# Patient Record
Sex: Male | Born: 1950 | Race: Black or African American | Hispanic: No | Smoking: Never smoker
Health system: Southern US, Community
[De-identification: ages and names within clinical notes are randomized; demographics above are authoritative.]

## PROBLEM LIST (undated history)

## (undated) DIAGNOSIS — Z973 Presence of spectacles and contact lenses: Secondary | ICD-10-CM

## (undated) DIAGNOSIS — F32A Depression, unspecified: Secondary | ICD-10-CM

## (undated) DIAGNOSIS — K219 Gastro-esophageal reflux disease without esophagitis: Secondary | ICD-10-CM

## (undated) DIAGNOSIS — R531 Weakness: Secondary | ICD-10-CM

## (undated) DIAGNOSIS — I10 Essential (primary) hypertension: Secondary | ICD-10-CM

## (undated) DIAGNOSIS — Z9889 Other specified postprocedural states: Secondary | ICD-10-CM

## (undated) DIAGNOSIS — K224 Dyskinesia of esophagus: Secondary | ICD-10-CM

## (undated) DIAGNOSIS — E119 Type 2 diabetes mellitus without complications: Secondary | ICD-10-CM

## (undated) DIAGNOSIS — K5790 Diverticulosis of intestine, part unspecified, without perforation or abscess without bleeding: Secondary | ICD-10-CM

## (undated) DIAGNOSIS — E785 Hyperlipidemia, unspecified: Secondary | ICD-10-CM

## (undated) DIAGNOSIS — F329 Major depressive disorder, single episode, unspecified: Secondary | ICD-10-CM

## (undated) DIAGNOSIS — K760 Fatty (change of) liver, not elsewhere classified: Secondary | ICD-10-CM

## (undated) DIAGNOSIS — J302 Other seasonal allergic rhinitis: Secondary | ICD-10-CM

## (undated) DIAGNOSIS — C61 Malignant neoplasm of prostate: Secondary | ICD-10-CM

## (undated) HISTORY — PX: PROSTATECTOMY: SHX69

## (undated) HISTORY — PX: COLONOSCOPY: SHX174

## (undated) HISTORY — DX: Weakness: R53.1

---

## 1997-06-22 ENCOUNTER — Ambulatory Visit (HOSPITAL_COMMUNITY): Admission: RE | Admit: 1997-06-22 | Discharge: 1997-06-22 | Payer: Self-pay | Admitting: Gastroenterology

## 1999-12-06 ENCOUNTER — Emergency Department (HOSPITAL_COMMUNITY): Admission: EM | Admit: 1999-12-06 | Discharge: 1999-12-06 | Payer: Self-pay | Admitting: Emergency Medicine

## 2000-02-02 ENCOUNTER — Emergency Department (HOSPITAL_COMMUNITY): Admission: EM | Admit: 2000-02-02 | Discharge: 2000-02-02 | Payer: Self-pay | Admitting: *Deleted

## 2000-05-20 ENCOUNTER — Encounter: Admission: RE | Admit: 2000-05-20 | Discharge: 2000-08-18 | Payer: Self-pay | Admitting: Family Medicine

## 2000-09-24 ENCOUNTER — Emergency Department (HOSPITAL_COMMUNITY): Admission: EM | Admit: 2000-09-24 | Discharge: 2000-09-25 | Payer: Self-pay | Admitting: Emergency Medicine

## 2000-09-25 ENCOUNTER — Encounter: Payer: Self-pay | Admitting: Emergency Medicine

## 2001-02-20 ENCOUNTER — Ambulatory Visit (HOSPITAL_COMMUNITY): Admission: RE | Admit: 2001-02-20 | Discharge: 2001-02-20 | Payer: Self-pay | Admitting: Gastroenterology

## 2001-08-22 ENCOUNTER — Encounter: Payer: Self-pay | Admitting: Family Medicine

## 2001-08-22 ENCOUNTER — Ambulatory Visit (HOSPITAL_COMMUNITY): Admission: RE | Admit: 2001-08-22 | Discharge: 2001-08-22 | Payer: Self-pay | Admitting: Family Medicine

## 2001-09-08 ENCOUNTER — Encounter: Payer: Self-pay | Admitting: Family Medicine

## 2001-09-08 ENCOUNTER — Encounter: Admission: RE | Admit: 2001-09-08 | Discharge: 2001-09-08 | Payer: Self-pay | Admitting: Family Medicine

## 2001-11-06 ENCOUNTER — Emergency Department (HOSPITAL_COMMUNITY): Admission: EM | Admit: 2001-11-06 | Discharge: 2001-11-06 | Payer: Self-pay | Admitting: Emergency Medicine

## 2002-03-14 ENCOUNTER — Ambulatory Visit (HOSPITAL_COMMUNITY): Admission: RE | Admit: 2002-03-14 | Discharge: 2002-03-14 | Payer: Self-pay | Admitting: *Deleted

## 2002-03-14 ENCOUNTER — Encounter: Payer: Self-pay | Admitting: *Deleted

## 2002-05-06 ENCOUNTER — Emergency Department (HOSPITAL_COMMUNITY): Admission: EM | Admit: 2002-05-06 | Discharge: 2002-05-06 | Payer: Self-pay

## 2003-01-23 HISTORY — PX: CARDIAC CATHETERIZATION: SHX172

## 2003-02-10 ENCOUNTER — Encounter: Admission: RE | Admit: 2003-02-10 | Discharge: 2003-02-10 | Payer: Self-pay | Admitting: Family Medicine

## 2003-05-26 ENCOUNTER — Emergency Department (HOSPITAL_COMMUNITY): Admission: EM | Admit: 2003-05-26 | Discharge: 2003-05-26 | Payer: Self-pay | Admitting: Emergency Medicine

## 2003-07-23 ENCOUNTER — Inpatient Hospital Stay (HOSPITAL_COMMUNITY): Admission: EM | Admit: 2003-07-23 | Discharge: 2003-07-27 | Payer: Self-pay | Admitting: Emergency Medicine

## 2003-12-22 ENCOUNTER — Encounter: Admission: RE | Admit: 2003-12-22 | Discharge: 2003-12-22 | Payer: Self-pay | Admitting: Gastroenterology

## 2004-01-23 DIAGNOSIS — C61 Malignant neoplasm of prostate: Secondary | ICD-10-CM

## 2004-01-23 HISTORY — DX: Malignant neoplasm of prostate: C61

## 2004-02-02 ENCOUNTER — Encounter: Admission: RE | Admit: 2004-02-02 | Discharge: 2004-02-02 | Payer: Self-pay | Admitting: Gastroenterology

## 2004-06-29 ENCOUNTER — Ambulatory Visit (HOSPITAL_COMMUNITY): Admission: RE | Admit: 2004-06-29 | Discharge: 2004-06-29 | Payer: Self-pay | Admitting: Urology

## 2004-08-22 ENCOUNTER — Inpatient Hospital Stay (HOSPITAL_COMMUNITY): Admission: RE | Admit: 2004-08-22 | Discharge: 2004-08-25 | Payer: Self-pay | Admitting: Urology

## 2004-08-22 ENCOUNTER — Encounter (INDEPENDENT_AMBULATORY_CARE_PROVIDER_SITE_OTHER): Payer: Self-pay | Admitting: Specialist

## 2004-08-22 DIAGNOSIS — Z8546 Personal history of malignant neoplasm of prostate: Secondary | ICD-10-CM

## 2004-08-22 HISTORY — DX: Personal history of malignant neoplasm of prostate: Z85.46

## 2005-09-11 ENCOUNTER — Encounter: Admission: RE | Admit: 2005-09-11 | Discharge: 2005-09-11 | Payer: Self-pay | Admitting: Family Medicine

## 2006-10-05 ENCOUNTER — Emergency Department (HOSPITAL_COMMUNITY): Admission: EM | Admit: 2006-10-05 | Discharge: 2006-10-05 | Payer: Self-pay | Admitting: Emergency Medicine

## 2007-04-16 ENCOUNTER — Emergency Department (HOSPITAL_COMMUNITY): Admission: EM | Admit: 2007-04-16 | Discharge: 2007-04-16 | Payer: Self-pay | Admitting: Emergency Medicine

## 2008-05-03 ENCOUNTER — Emergency Department (HOSPITAL_COMMUNITY): Admission: EM | Admit: 2008-05-03 | Discharge: 2008-05-03 | Payer: Self-pay | Admitting: *Deleted

## 2008-12-29 ENCOUNTER — Emergency Department (HOSPITAL_COMMUNITY): Admission: EM | Admit: 2008-12-29 | Discharge: 2008-12-29 | Payer: Self-pay | Admitting: Emergency Medicine

## 2009-03-08 ENCOUNTER — Emergency Department (HOSPITAL_COMMUNITY): Admission: EM | Admit: 2009-03-08 | Discharge: 2009-03-08 | Payer: Self-pay | Admitting: Family Medicine

## 2010-04-14 ENCOUNTER — Other Ambulatory Visit: Payer: Self-pay | Admitting: Family Medicine

## 2010-04-18 ENCOUNTER — Other Ambulatory Visit: Payer: Self-pay | Admitting: Family Medicine

## 2010-04-18 ENCOUNTER — Ambulatory Visit
Admission: RE | Admit: 2010-04-18 | Discharge: 2010-04-18 | Disposition: A | Discharge status: Home / Self Care | Payer: 59 | Source: Ambulatory Visit | Attending: Family Medicine | Admitting: Family Medicine

## 2010-04-18 MED ORDER — IOHEXOL 300 MG/ML  SOLN
100.0000 mL | Freq: Once | INTRAMUSCULAR | Status: AC | PRN
  Administered 2010-04-18: 100 mL via INTRAVENOUS

## 2010-05-24 ENCOUNTER — Emergency Department (HOSPITAL_COMMUNITY)
Admission: EM | Admit: 2010-05-24 | Discharge: 2010-05-25 | Disposition: A | Discharge status: Home / Self Care | Payer: 59 | Attending: Emergency Medicine | Admitting: Emergency Medicine

## 2010-05-24 DIAGNOSIS — M542 Cervicalgia: Secondary | ICD-10-CM | POA: Insufficient documentation

## 2010-05-24 DIAGNOSIS — Z8546 Personal history of malignant neoplasm of prostate: Secondary | ICD-10-CM | POA: Insufficient documentation

## 2010-05-24 DIAGNOSIS — E119 Type 2 diabetes mellitus without complications: Secondary | ICD-10-CM | POA: Insufficient documentation

## 2010-05-24 DIAGNOSIS — R209 Unspecified disturbances of skin sensation: Secondary | ICD-10-CM | POA: Insufficient documentation

## 2010-05-24 DIAGNOSIS — E785 Hyperlipidemia, unspecified: Secondary | ICD-10-CM | POA: Insufficient documentation

## 2010-05-24 DIAGNOSIS — I10 Essential (primary) hypertension: Secondary | ICD-10-CM | POA: Insufficient documentation

## 2010-05-24 DIAGNOSIS — Z79899 Other long term (current) drug therapy: Secondary | ICD-10-CM | POA: Insufficient documentation

## 2010-05-24 DIAGNOSIS — R51 Headache: Secondary | ICD-10-CM | POA: Insufficient documentation

## 2010-05-24 DIAGNOSIS — Z7982 Long term (current) use of aspirin: Secondary | ICD-10-CM | POA: Insufficient documentation

## 2010-05-24 DIAGNOSIS — M25519 Pain in unspecified shoulder: Secondary | ICD-10-CM | POA: Insufficient documentation

## 2010-05-24 DIAGNOSIS — M436 Torticollis: Secondary | ICD-10-CM | POA: Insufficient documentation

## 2010-05-25 ENCOUNTER — Emergency Department (HOSPITAL_COMMUNITY): Payer: 59

## 2010-06-09 NOTE — Procedures (Signed)
[facility]  Patient:    [name], [name] Visit Number: [id number] MRN: [id number]          Service Type: END Location: ENDO Attending Physician:  Nelda Marseille Dictated by:   Petra Kuba, M.D. Proc. Date: 02/20/01 Admit Date:  02/20/2001   CC:         Duncan Dull, M.D.   Procedure Report  PROCEDURE:  Colonoscopy.  INDICATIONS FOR PROCEDURE:  A patient with a history of colon polyps and left sided abdominal pain due for repeat screening.  Consent was signed after risks, benefits, methods, and options were thoroughly discussed in the office.  MEDICINES USED:  Demerol 80, Versed 7.  DESCRIPTION OF PROCEDURE:  Rectal inspection was pertinent for small external hemorrhoids. Digital exam was negative. The video pediatric adjustable colonoscope was inserted and easily advanced to the proximal level of the hepatic flexure. To advance to the cecum did require some abdominal pressure but no position changes. The cecum was identified by the appendiceal orifice and the ileocecal valve. In fact, the scope was inserted a short ways into the terminal ileum which was normal. Photo documentation was obtained. The scope was slowly withdrawn. The prep was adequate. There was some liquid stool that required washing and suctioning. The descending colon had a few diverticula. There was also some diverticula in the sigmoid but otherwise on slow withdrawal back to the rectum, no polypoid lesions or masses were seen. Once back in the rectum, the scope was retroflexed pertinent for some internal hemorrhoids. The scope was straightened and readvanced to probably the splenic flexure. No additional findings were seen, air was suctioned, scope removed. The patient tolerated the procedure well and there was no obvious or immediate complications.  ENDOSCOPIC DIAGNOSIS:  1. Small internal/external hemorrhoids.  2. Left and right sigmoid and ascending  diverticula.  3. Otherwise within normal limits to the terminal ileum.  PLAN:  Yearly rectals and guaiacs per Dr. Kevan Ny. Happy to see back p.r.n. otherwise repeat colonic screening in five years. Dictated by:   Petra Kuba, M.D. Attending Physician:  Nelda Marseille DD:  02/20/01 TD:  02/21/01 Job: [id number]

## 2010-06-09 NOTE — Cardiovascular Report (Signed)
NAME:  [name], [name]                          ACCOUNT NO.:  [id number]   MEDICAL RECORD NO.:  [id number]                   PATIENT TYPE:  INP   LOCATION:  4741                                 FACILITY:  [facility]   PHYSICIAN:  Colleen Can. Deborah Chalk, M.D.            DATE OF BIRTH:  01-02-1951   DATE OF PROCEDURE:  07/27/2003  DATE OF DISCHARGE:                              CARDIAC CATHETERIZATION   PROCEDURE:  Left heart catheterization with selective coronary angiography,  left ventricular angiography and AngioSeal.   CARDIOLOGIST:  Colleen Can. Deborah Chalk, M.D.   INDICATIONS:  [name] is a 60 year old male who presents with chest  pain.  He has had previous hospitalizations for evaluation of chest pain.  He has diabetes, hypercholesterolemia, hypertension and positive family  history of heart disease.   TYPE AND SITE OF ENTRY:  Percutaneous right femoral artery.   CATHETERS:  A 6 French 4 curved Judkins right and left coronary catheters, 6  French pigtail ventriculographic catheter.   CONTRAST MATERIAL:  Omnipaque.   MEDICATIONS GIVEN PRIOR TO THE PROCEDURE:  Valium 10 mg p.o.   MEDICATIONS GIVEN DURING THE PROCEDURE:  Versed 3 mg IV.   COMMENTS:  The patient tolerated the procedure well.   HEMODYNAMIC DATA:  The aortic pressure was 99/68.  LV was 107/2-7.  There  was no aortic valve gradient noted on pullback.   ANGIOGRAPHIC DATA:  1. The left main coronary artery was normal.  2. The left circumflex continues primarily as a moderately large obtuse     marginal.  The obtuse marginal continues as a relatively smooth large     vessel and then terminates in a trifurcation with three moderate size     vessels to the posterior lateral wall.  It is normal.  3. Left anterior descending is a moderately large vessel that wraps around     the tip of the apex.  There is one prominently  large diagonal vessel.     The left anterior descending and diagonal vessel are normal.  4. Right  coronary artery is a reasonably large dominant system.  The origin     of the right coronary artery has a somewhat downward and inferior origin     and takeoff.  The right coronary artery is normal.  The posterior     descending and the posterior lateral branches are essentially normal.   LEFT VENTRICULOGRAPHY:  Left ventricular angiogram was performed in the RAO  position.  The overall cardiac size and silhouette were normal.  The global  ejection fraction is 60-65%.  Regional wall motion is normal.  There is no  mitral regurgitation, intracardiac calcification or intracavitary filling  defect.   OVERALL IMPRESSION:  1. Normal coronary arteries.  2. Normal left ventricular function.  Colleen Can. Deborah Chalk, M.D.    SNT/MEDQ  D:  07/27/2003  T:  07/27/2003  Job:  [id number]   cc:   Duncan Dull, M.D.  [address]  [place]  [place] [address]  Fax: [phone]   Jackie Plum, M.D.

## 2010-06-09 NOTE — H&P (Signed)
NAME:  [name], [name]                          ACCOUNT NO.:  [id number]   MEDICAL RECORD NO.:  [id number]                   PATIENT TYPE:  INP   LOCATION:  1827                                 FACILITY:  [facility]   PHYSICIAN:  Hollice Espy, M.D.            DATE OF BIRTH:  [birth year]   DATE OF ADMISSION:  07/23/2003  DATE OF DISCHARGE:                                HISTORY & PHYSICAL   PRIMARY CARE PHYSICIAN:  Duncan Dull, M.D.   CHIEF COMPLAINT:  Chest pain.   HISTORY OF PRESENT ILLNESS:  This is a 60 year old African American male  with a past medical history of hypertension, hyperlipidemia, diabetes and  gastroesophageal reflux disease who presents to the emergency room with  chest pain.  The patient's states he has been relatively previously well.  He has had an episode in the past where he complains of a sensation of  feeling a presence like something is caught in his chest.  He has a hard  time describing what exactly this is, but says that this is not new.  However, then late last night, the patient started having new-onset chest  pain described as kind of sharp, non-radiating.  It did not make the patient  short or lightheaded.  This occurred in the evening before he went to bed,  so he went to sleep to see if it would resolve.  The patient states he is  unaware if it persisted throughout the night, but it did not wake him up,  but again this morning when he awoke, the pain was still there.  It did not  radiate.  He described it as a 6 or 7/10 with discomfort.  The patient's  denied any shortness of breath but did feel diaphoretic.  The symptoms soon  abated, and he continued throughout his day.  The patient then in the late  hours or the early morning, noted bilateral arm and leg cramping.  He had  not every had these symptoms either.  The patient continued with his day,  and these symptoms resolved as well, and then the patient started  complaining of the recurrence  of the chest pain similar to what had occurred  the previous night.  He also then noted that his left arm was going numb.  After speaking with some concerned family members and friends, he came into  the emergency room this evening.  At this point, most of the chest pain had  resolved as well as the numbness.  He did have some trace symptoms as well  as his continued sensation of a sort of what he describes as a presence in  his chest more on the left side. In the emergency room, the patient's EKG  and cardiac enzymes were normal.  His chest x-ray was reportedly normal as  well.  Currently, the patient states that his chest pain itself has  completely resolved.  He has some complaints of his chest sensation which  again is not new.  He denies any shortness of breath.  He does continue to  feel hot and slightly sweaty.  He has no other complaints.  He denies any  shortness of breath.  He denies any palpitations.  He denies any headaches,  visual changes, dysphagia, abdominal pain, hematuria, dysuria, constipation  or diarrhea.  He denies any focal extremity pain, weakness or numbness  currently.   PAST MEDICAL HISTORY:  1. Hyperlipidemia.  2. Hypertension.  3. Diabetes.  4. Gastroesophageal reflux disease.  5. History of colon polyps.   MEDICATIONS:  1. The patient takes Glucophage 1000 mg p.o. b.i.d.  2. Protonix 40 daily.  3. Hydrochlorothiazide 25 mg p.o. daily.  4. Prinivil.  5. Lipitor.  He tells me has recently gone down to half a pill after his     last cholesterol test where his PCP told him that his cholesterol was too     low.   ALLERGIES:  He has no known drug allergies.   SOCIAL HISTORY:  He denies any tobacco, alcohol or drug use.   FAMILY HISTORY:  Noncontributory.  He does not have any family members who  have died at a young age of a myocardial infarction.   PREVIOUS STUDIES:  The patient did have a stress test done approximately  three years ago.   PHYSICAL  EXAMINATION:  VITAL SIGNS:  On admission, temperature 99,  respirations 18, heart rate 96, blood pressure 154/86.  Now his blood  pressure is down to 114/77.  O2 saturations 99% on room air.  GENERAL:  He is alert and oriented x3.  No apparent distress.  HEENT:  Normocephalic, atraumatic.  He has no carotid bruits.  Mucous  membranes are slightly dry.  HEART:  Regular rate and rhythm, S1, S2.  No murmurs are appreciated.  LUNGS:  Clear to auscultation bilaterally.  ABDOMEN:  Soft, nontender, nondistended.  Positive bowel sounds.  EXTREMITIES:  He has no cyanosis, clubbing or edema.  He has good 2+ pulses.  Full range of motion and normal sensation.  He has no focal neurological  deficits.   LABORATORY DATA:  Sodium 133, potassium 3.6, chloride 101, bicarbonate 24,  BUN 16, creatinine 0.8, glucose 159, calcium 9.2.  Total protein 6.4.  The  rest of his LFT's are within normal limits.  White count 6.9.  H&H is 15.6  and 43.9.  MCV 91.  Platelet count 222.  He has no shift.  PT/INR and PTT  are all normal.  His first set of cardiac enzymes is 60.8 with MB of 1.9,  troponin I is less than 0.05.  Subsequent two sets are 39.3 and less than 1  with a troponin I of less than 0.05.  Subsequent two sets are 39.3 and less  than 1 with a troponin I of less than 0.05.   EKG shows normal sinus rhythm with no ST elevations or depression.   Chest x-ray showed no acute disease.   ASSESSMENT/PLAN:  1. Chest pain, slightly atypical, but given his comorbidities, the patient     is at an increased risk.  We will go ahead and check two more sets of     cardiac enzymes, and take a stress test in the morning.  2. We are also going to check a D-Dimer now.  If it is positive, we will     check a CT to rule out PE.  We will  not check a fasting lipid profile or     hemoglobin A1c.  The patient recently had these done two to three weeks    ago in the office by Dr. [name], and we will not change her management  at     this time.  3. Diabetes mellitus.  He is on Glucophage.  4. Hypertension.  He is on Prinivil and hydrochlorothiazide.  5. Hyperlipidemia.  He is on Lipitor.  6. Gastroesophageal reflux disease.  On Protonix.  7. History of colon polyps.  This medical issue appears to be stable.                                                Hollice Espy, M.D.    SKK/MEDQ  D:  07/23/2003  T:  07/24/2003  Job:  [id number]   cc:   Duncan Dull, M.D.  [address]  [place]  [place] [address]  Fax: [phone]

## 2010-06-09 NOTE — Consult Note (Signed)
[name], [name]                ACCOUNT NO.:  [id number]   MEDICAL RECORD NO.:  [id number]          PATIENT TYPE:  INP   LOCATION:  1421                         FACILITY:  [facility]   PHYSICIAN:  Hollice Espy, M.D.DATE OF BIRTH:  08/11/1950   DATE OF CONSULTATION:  DATE OF DISCHARGE:                                   CONSULTATION   REASON FOR CONSULTATION:  Diabetic management.   PRIMARY CARE PHYSICIAN:  Pam Drown, M.D.   HOSPITAL COURSE:  The patient is a 60 year old African-American male with a  past medical history of diabetes, hypertension, hyperlipidemia found to have  prostate cancer. He was evaluated in May of 2006 found to have a PSA of 5.2  which had been a 14% increase. A biopsy showed a Gleason score of 6 with  70%. He underwent a bone scan which showed no mets and underwent a  retropubic prostatectomy today, August 22, 2004. Postop he was brought up to  the 1400 floor at [facility]. He has been complaining of pain but says that  it is feeling better once he pushes his PCA pump. He currently has no other  complaints other than fatigue. He denies any headache, vision changes. He  says he has no appetite, no dysphagia, no chest pain, palpitations,  shortness of breath, wheezes, cough, abdominal pain, hematuria, dysuria,  constipation or diarrhea. He complains of some groin and back pain but  denies any focal or extremity numbness, weakness or pain of his extremities.  His review of systems is otherwise negative.   PAST MEDICAL HISTORY:  Hypertension, hyperlipidemia, GERD, prostate cancer,  diabetes mellitus type 2, history of a normal cardiac cath in July of 2005.   MEDICATIONS:  1.  Nexium 40.  2.  Metformin 2000 g p.o. b.i.d.  3.  Hydrochlorothiazide 25 p.o. daily.  4.  Lipitor 10 p.o. daily.  5.  Lisinopril 5 p.o. daily.  6.  Multivitamin.  7.  Aspirin.   ALLERGIES:  His allergy is reported to be TYLENOL although this is  questionable.   FAMILY  HISTORY:  Hypertension, diabetes.   SOCIAL HISTORY:  He denies any tobacco, alcohol or drug use. He is currently  separated from his spouse.   PHYSICAL EXAMINATION:  VITAL SIGNS:  The patient's initial set of vitals on  admission, temperature 97, blood pressure 109/72, respirations 80, heart  rate 18. O2 sat 98% on room air.  GENERAL:  The patient is alert and oriented although drowsy but he is able  to answer questions appropriately.  HEENT:  Normocephalic, atraumatic. His mucous membranes are moist.  HEART:  Regular rate and rhythm, S1, S2.  LUNGS:  Clear to auscultation bilaterally.  ABDOMEN:  Soft, nontender, nondistended, positive bowel sounds.  EXTREMITIES:  No clubbing or cyanosis, trace pitting edema.   LABORATORY DATA:  H&H at 12 and 36.6.  UA is unremarkable. PTT 28, PT 13.3,  INR 1. Sodium 134, potassium 4.2, chloride 100, bicarb 29, BUN 14,  creatinine 0.9, glucose 110, calcium 9.1. All labs are obtained from July 27  except for his H&H which  is from today.   ASSESSMENT/PLAN:  1.  Diabetes mellitus. The patient is currently n.p.o. on ice chips.      Continue with a q.6 h sensitive sliding scale NovoLog. Once he is      allowed to be on clear liquids, would advance him to a t.i.d. a.c. h.s.      resistant sliding scale NovoLog and then can restart his Metformin. The      good thing is that Metformin will not cause hypoglycemia.  2.  Hypertension. Continue medications, blood pressure well controlled.  3.  Hyperlipidemia. Continue Lipitor.  4.  Gastroesophageal reflux disease. Continue protonix.  5.  Prostate cancer status post prostatectomy.   Please note in the event of higher blood sugars or blood pressure, this may  be secondary to pain and as long as the patient is well controlled, he  should have minimal complications from a medical standpoint.       SKK/MEDQ  D:  08/22/2004  T:  08/23/2004  Job:  [id number]   cc:   Boston Service, M.D.  [address]  [place]  [place] [address]  Fax: [phone]   Pam Drown, M.D.  [address]  [place]  [place] [address]  Fax: [phone]

## 2010-06-09 NOTE — Discharge Summary (Signed)
[name], [name]                ACCOUNT NO.:  [id number]   MEDICAL RECORD NO.:  [id number]          PATIENT TYPE:  INP   LOCATION:  1421                         FACILITY:  [facility]   PHYSICIAN:  Boston Service, M.D.DATE OF BIRTH:  Dec 17, 1950   DATE OF ADMISSION:  08/22/2004  DATE OF DISCHARGE:  08/25/2004                                 DISCHARGE SUMMARY   Indications, medications, allergies, tobacco, EtOH, past medical history,  social history, physical exam, and review of systems were all outlined in  the admitting note.   HOSPITAL COURSE:  The patient was admitted September 11, 2004. Underwent a  radical retropubic prostatectomy and pelvic lymph node dissection. Two units  of autologous blood had been administered intraoperatively. Hemoglobin the  evening after surgery was 12. Jackson-Pratt output declined steadily over  the first several days. Gratefully appreciate internal medicine follow-up  with Dr. Jasmine Pang for management of diabetes. Sugars remained under  good control. By postoperative day #1 BUN 9, creatinine 0.8, hemoglobin  12.5, white count 9.0. Jackson-Pratt output had fallen to about 30 mL a  shift. The patient was advanced to a regular diet. IV fluids were  discontinued. By postoperative day #2 Jackson-Pratt drain was removed. The  patient continuing to have discomfort particularly with activity. PCA  Dilaudid was continued with increasing ambulation. Bowel function returned  to normal. Sugars remained under good control. By August 25, 2004 the patient  was ready for discharge. Prescriptions for Nexium, metformin,  hydrochlorothiazide, Lipitor, oxycodone, Colace, and Cipro were written. The  patient was discharged home with Foley catheter to straight drain and a leg  bag. with plan for follow-up office visit and staple removal in about 1  week. Pathology report August 22, 2004:  Right and left pelvic lymph nodes:  No evidence of  tumor. Prostate showed Gleason  score of 7 (3 + 4) involving both lobes,  focal involvement of the apical surgical margin, benign seminal vesicles, no  involvement of the prostate base. Discussed situation with the patient. I am  inclined a follow PSA closely over the next several months and years.           ______________________________  [place] Service, M.D.     RH/MEDQ  D:  09/07/2004  T:  09/07/2004  Job:  [id number]   cc:   Elmore Guise., M.D.  [address]  [place], [place] [address]  Fax: [phone]   Pam Drown, M.D.  [address]  [place]  [place] [address]  Fax: [phone]   Duncan Dull, M.D.  [address]  [address]  [place]  [place] [address]  Fax: [phone]

## 2010-06-09 NOTE — Discharge Summary (Signed)
NAME:  [name], [name]                          ACCOUNT NO.:  [id number]   MEDICAL RECORD NO.:  [id number]                   PATIENT TYPE:  INP   LOCATION:  4741                                 FACILITY:  [facility]   PHYSICIAN:  Corinna Wolf. Lendell Caprice, MD             DATE OF BIRTH:  1950/07/14   DATE OF ADMISSION:  07/23/2003  DATE OF DISCHARGE:  07/27/2003                                 DISCHARGE SUMMARY   DIAGNOSES:  1. Chest pain, most likely secondary to gastroesophageal reflux     disease/esophageal spasm.  2. Diabetes.  3. Hyperlipidemia.  4. Gastroesophageal reflux disease.  5. Hypertension.   DISCHARGE MEDICATIONS:  1. He is to continue his outpatient medications, except change his Protonix     to 40 mg p.o. b.i.d.  2. He is to continue his Lipitor 5 mg daily.  3. Aspirin 81 mg daily.  4. Hydrochlorothiazide.  5. Prinivil.  6. Metformin 100 mg p.o. b.i.d.   CONDITION ON DISCHARGE:  Stable.   ACTIVITY:  Ad lib.   DIET:  Diabetic.  He is to watch out for fatty foods, caffeine, and other  foods that can worsen his reflux.   FOLLOW UP:  He may follow up with Dr. Shaune Pollack and/or Dr. Ewing Schlein if the  chest pain does not improve.  Consider upper endoscopy.   CONSULTATIONS:  [facility].   PROCEDURES:  Cardiac catheterization showing clean coronaries and normal  ejection fraction.   PERTINENT LABORATORIES:  Total cholesterol 134, LDL 73, HDL 37.  CBC  unremarkable.  Comprehensive metabolic panel unremarkable except for a  glucose of 159.  CPK, MB, and troponin negative x3.   SPECIAL STUDIES AND RADIOLOGY:  EKG showed normal sinus rhythm.  Chest x-ray  showed nothing acute.  Stress Cardiolite showed stress-induced ischemia at  the anterolateral apex.   HISTORY AND HOSPITAL COURSE:  Mr. Tout is a very pleasant 60 year old 56-  year-old black male with multiple cardiac risk factors who presented with  atypical chest pain, but given his risk factors he was admitted  to  telemetry, ruled out for myocardial infarction, and a cardiology  consultation was obtained.  Cardiolite test did show some ischemia, and the  patient therefore underwent catheterization, which was normal.  The patient  continued to have a few episodes of discomfort, and upon further questioning  reported that this was accompanied by belching and felt like something was  stuck in his chest.  He has a history of gastroesophageal reflux disease,  and was on Protonix daily.  I suspect, therefore, that this is esophageal  spasm, given his negative catheterization.  I have, therefore, increased his  Protonix to twice a day.   FOLLOW UP:  1. He may follow up with Dr. Kevan Ny.  2. He also reports that he is known to Dr. Ewing Schlein, and would like to follow     up with him, as well,  which I believe is reasonable.                                                Corinna Wolf. Lendell Caprice, MD    CLS/MEDQ  D:  07/27/2003  T:  07/28/2003  Job:  [id number]   cc:   Duncan Dull, M.D.  [address]  [place]  [place] [address]  Fax: [phone]   Petra Kuba, M.D.  [address]  [place]  [place] [address]  Fax: [phone]

## 2010-06-09 NOTE — Op Note (Signed)
[name], [name]                ACCOUNT NO.:  [id number]   MEDICAL RECORD NO.:  [id number]          PATIENT TYPE:  INP   LOCATION:  1421                         FACILITY:  [facility]   PHYSICIAN:  Boston Service, M.D.DATE OF BIRTH:  1950-11-17   DATE OF PROCEDURE:  08/22/2004  DATE OF DISCHARGE:                                 OPERATIVE REPORT   Indications, medications, allergies, tobacco, ETOH, past medical history,  social history, physical exam and review of systems all outlined in  handwritten H&P.   PREOPERATIVE DIAGNOSIS:  High-volume prostate cancer 60 year old black male  with hypertension and diabetes.   POSTOPERATIVE DIAGNOSIS:  High-volume prostate cancer 60 year old black male  with hypertension and diabetes.   PROCEDURE:  Radical retropubic prostatectomy with bilateral pelvic lymph  node dissection.   SURGEON:  Boston Service, M.D.   ASSISTANTS:  1.  Jamison Neighbor, M.D.  2.  Glade Nurse, M.D.   ANESTHESIA:  General.   DRAINS:  10 flat Jackson-Pratt.   SPECIMENS:  Right and left pelvic nodes, prostate with attached vas and  seminal vesicles.   ESTIMATED BLOOD LOSS:  About 900 mL replaced with 2 units of autologous  blood.   COMPLICATIONS:  None obvious.   DESCRIPTION OF PROCEDURE:  The patient was prepped and draped in the supine  position after institution of an adequate level of general anesthesia. A  midline infraumbilical incision was carried through the skin and muscular  layers of the anterior abdominal wall. The peritoneal cavity was retracted  superiorly to reveal the right and left obturator fossa as well as the  retropubic space. The patient weighed 137 pounds and has a paucity of intra-  abdominal fat. Node dissection carried out between the external iliac vein  superiorly and the obturator nerve inferiorly. There were no enlarged or  indurated nodes and overall the packet of fibrolymphatic tissue was rather  small in both cases.  Tissue was sent for permanent section. The  puboprostatic ligaments were then taken down sharply, endopelvic fascia was  divided lateral to the prostate, right-angled clamp was then passed anterior  to the urethra at the prostatic apex. The dorsal vein complex was oversewed  with interrupted sutures of 2-0 chromic and then divided. Dissection was  then carried down the anterior surface of the prostate to reveal the  anterior surface of the urethra. Due to the patient's high-volume cancer, no  attempt was made to preserve the neurovascular bundles wide dissection was  used at the apex to free the junction of the prostatic apex to the urethra.  It was gently encircled with a right-angle clamp, elevated on an umbilical  tape and then divided. Indwelling catheter was then retracted superiorly,  prostatic apex was then dissected from the rectum, continued dissection took  both right and left chronic prostatic pedicles fairly wide in hopes of  avoiding a positive margin in this area. The vas and seminal vesicles then  came into view, they were dissected down for about 2 cm. The vas was clipped  with hemoclips, seminal vesicle was tied with an interrupted silk and then  divided. The muscular fibers of the bladder neck were then gently separated  using a fine tipped Bovie and a fine tipped hemostat. Longitudinal fibers at  the junction of the prostatic base and bladder were identified, gently  dissected free with the Kitner, right-angle clamp was then passed posterior  to the urethra which was elevated on an umbilical tape and divided on knife  on a long handle. The bladder was retracted anteriorly, anterior surface of  the vas and seminal vesicles were then dissected free. The prostate with  attached vas and seminal vesicles was sent as a specimen. The mucosa at the  bladder neck was then everted with a series of interrupted 3-0 chromic  sutures. Bleeding sites were lightly cauterized, oversewn  with small  stitches of 2-0 chromic on a UR5 needle. Once adequate hemostasis had been  obtained, [place] sound was passed per urethra, stitches placed at the 8  o'clock, 10 o'clock, 12 o'clock, 2 o'clock and 4 o'clock position. The  [place] sound was removed, 20-French silicone catheter was inserted,  passed into the bladder. Urethral stitches were then brought through a  similar position in the bladder neck and the urethral anastomosis was sewn  down. A 10 flat Jackson-Pratt drain was brought out through a stab incision  in the left lower quadrant, catheter irrigated easily, fascia was closed  with a running suture of #1 PDS, skin was closed with skin staples. The  patient was returned to recovery in satisfactory condition.       RH/MEDQ  D:  08/22/2004  T:  08/23/2004  Job:  [id number]   cc:   Duncan Dull, M.D.  [address]  [address]  [place]  [place] [address]  Fax: [phone]   Pam Drown, M.D.  [address]  [place]  [place] [address]  Fax: [phone]

## 2010-06-30 ENCOUNTER — Emergency Department (HOSPITAL_COMMUNITY): Payer: Worker's Compensation

## 2010-06-30 ENCOUNTER — Emergency Department (HOSPITAL_COMMUNITY)
Admission: EM | Admit: 2010-06-30 | Discharge: 2010-07-01 | Disposition: A | Discharge status: Home / Self Care | Payer: Worker's Compensation | Attending: Emergency Medicine | Admitting: Emergency Medicine

## 2010-06-30 DIAGNOSIS — Y990 Civilian activity done for income or pay: Secondary | ICD-10-CM | POA: Insufficient documentation

## 2010-06-30 DIAGNOSIS — M25519 Pain in unspecified shoulder: Secondary | ICD-10-CM | POA: Insufficient documentation

## 2010-06-30 DIAGNOSIS — Z8546 Personal history of malignant neoplasm of prostate: Secondary | ICD-10-CM | POA: Insufficient documentation

## 2010-06-30 DIAGNOSIS — E119 Type 2 diabetes mellitus without complications: Secondary | ICD-10-CM | POA: Insufficient documentation

## 2010-06-30 DIAGNOSIS — Y9269 Other specified industrial and construction area as the place of occurrence of the external cause: Secondary | ICD-10-CM | POA: Insufficient documentation

## 2010-06-30 DIAGNOSIS — IMO0002 Reserved for concepts with insufficient information to code with codable children: Secondary | ICD-10-CM | POA: Insufficient documentation

## 2010-06-30 DIAGNOSIS — I10 Essential (primary) hypertension: Secondary | ICD-10-CM | POA: Insufficient documentation

## 2010-06-30 DIAGNOSIS — X500XXA Overexertion from strenuous movement or load, initial encounter: Secondary | ICD-10-CM | POA: Insufficient documentation

## 2010-08-12 ENCOUNTER — Emergency Department (HOSPITAL_COMMUNITY): Payer: 59

## 2010-08-12 ENCOUNTER — Emergency Department (HOSPITAL_COMMUNITY)
Admission: EM | Admit: 2010-08-12 | Discharge: 2010-08-12 | Disposition: A | Discharge status: Home / Self Care | Payer: 59 | Attending: Emergency Medicine | Admitting: Emergency Medicine

## 2010-08-12 DIAGNOSIS — R209 Unspecified disturbances of skin sensation: Secondary | ICD-10-CM | POA: Insufficient documentation

## 2010-08-12 DIAGNOSIS — I10 Essential (primary) hypertension: Secondary | ICD-10-CM | POA: Insufficient documentation

## 2010-08-12 DIAGNOSIS — Z8546 Personal history of malignant neoplasm of prostate: Secondary | ICD-10-CM | POA: Insufficient documentation

## 2010-08-12 DIAGNOSIS — R51 Headache: Secondary | ICD-10-CM | POA: Insufficient documentation

## 2010-08-12 DIAGNOSIS — E119 Type 2 diabetes mellitus without complications: Secondary | ICD-10-CM | POA: Insufficient documentation

## 2010-08-12 LAB — BASIC METABOLIC PANEL
CO2: 28 mEq/L (ref 19–32)
Chloride: 103 mEq/L (ref 96–112)
Creatinine, Ser: 0.64 mg/dL (ref 0.50–1.35)
Potassium: 4.1 mEq/L (ref 3.5–5.1)
Sodium: 136 mEq/L (ref 135–145)

## 2010-08-12 LAB — DIFFERENTIAL
Eosinophils Relative: 2 % (ref 0–5)
Lymphocytes Relative: 36 % (ref 12–46)
Lymphs Abs: 1.9 10*3/uL (ref 0.7–4.0)
Monocytes Relative: 12 % (ref 3–12)

## 2010-08-12 LAB — CBC
HCT: 45.4 % (ref 39.0–52.0)
MCH: 30.5 pg (ref 26.0–34.0)
MCV: 89.4 fL (ref 78.0–100.0)
RBC: 5.08 MIL/uL (ref 4.22–5.81)
RDW: 12.6 % (ref 11.5–15.5)
WBC: 5.2 10*3/uL (ref 4.0–10.5)

## 2010-08-12 LAB — PRO B NATRIURETIC PEPTIDE: Pro B Natriuretic peptide (BNP): 21.4 pg/mL (ref 0–125)

## 2010-08-12 LAB — GLUCOSE, CAPILLARY: Glucose-Capillary: 155 mg/dL — ABNORMAL HIGH (ref 70–99)

## 2010-10-16 LAB — BASIC METABOLIC PANEL
Calcium: 9.1
Chloride: 100
GFR calc Af Amer: >60
GFR calc non Af Amer: >60
Glucose, Bld: 125 — ABNORMAL HIGH
Potassium: 3.9
Sodium: 133 — ABNORMAL LOW

## 2010-11-01 ENCOUNTER — Emergency Department (HOSPITAL_COMMUNITY)
Admission: EM | Admit: 2010-11-01 | Discharge: 2010-11-02 | Disposition: A | Discharge status: Home / Self Care | Payer: 59 | Attending: Emergency Medicine | Admitting: Emergency Medicine

## 2010-11-01 ENCOUNTER — Emergency Department (HOSPITAL_COMMUNITY): Payer: 59

## 2010-11-01 DIAGNOSIS — I10 Essential (primary) hypertension: Secondary | ICD-10-CM | POA: Insufficient documentation

## 2010-11-01 DIAGNOSIS — Z8546 Personal history of malignant neoplasm of prostate: Secondary | ICD-10-CM | POA: Insufficient documentation

## 2010-11-01 DIAGNOSIS — M8560 Other cyst of bone, unspecified site: Secondary | ICD-10-CM | POA: Insufficient documentation

## 2010-11-01 DIAGNOSIS — E119 Type 2 diabetes mellitus without complications: Secondary | ICD-10-CM | POA: Insufficient documentation

## 2010-11-01 DIAGNOSIS — R51 Headache: Secondary | ICD-10-CM | POA: Insufficient documentation

## 2010-11-01 LAB — POCT I-STAT, CHEM 8
Chloride: 99 mEq/L (ref 96–112)
HCT: 50 % (ref 39.0–52.0)
Hemoglobin: 17 g/dL (ref 13.0–17.0)
Potassium: 4.1 mEq/L (ref 3.5–5.1)
Sodium: 134 mEq/L — ABNORMAL LOW (ref 135–145)

## 2010-11-01 LAB — GLUCOSE, CAPILLARY: Glucose-Capillary: 162 mg/dL — ABNORMAL HIGH (ref 70–99)

## 2010-11-02 ENCOUNTER — Encounter (HOSPITAL_COMMUNITY): Payer: Self-pay

## 2010-11-03 LAB — DIFFERENTIAL
Basophils Absolute: 0
Eosinophils Relative: 2
Lymphocytes Relative: 29
Monocytes Absolute: 0.7
Monocytes Relative: 11
Neutro Abs: 3.7

## 2010-11-03 LAB — COMPREHENSIVE METABOLIC PANEL
AST: 31
Albumin: 3.7
Alkaline Phosphatase: 76
BUN: 13
Chloride: 104
Creatinine, Ser: 0.78
GFR calc Af Amer: >60
Potassium: 4
Total Bilirubin: 0.6
Total Protein: 6.5

## 2010-11-03 LAB — THEOPHYLLINE LEVEL: Theophylline Lvl: 0.5 — ABNORMAL LOW

## 2010-11-03 LAB — CBC
HCT: 44.4
Hemoglobin: 15
WBC: 6.3

## 2010-11-03 LAB — CK TOTAL AND CKMB (NOT AT ARMC)
CK, MB: 3
Relative Index: 1

## 2010-11-03 LAB — APTT: aPTT: 27

## 2011-01-13 ENCOUNTER — Other Ambulatory Visit: Payer: Self-pay

## 2011-01-13 ENCOUNTER — Encounter (HOSPITAL_COMMUNITY): Payer: Self-pay | Admitting: Emergency Medicine

## 2011-01-13 ENCOUNTER — Emergency Department (INDEPENDENT_AMBULATORY_CARE_PROVIDER_SITE_OTHER)
Admission: EM | Admit: 2011-01-13 | Discharge: 2011-01-13 | Disposition: A | Discharge status: Nursing Home (Includes ICF) | Payer: 59 | Source: Home / Self Care | Attending: Family Medicine | Admitting: Family Medicine

## 2011-01-13 ENCOUNTER — Emergency Department (HOSPITAL_COMMUNITY): Payer: 59

## 2011-01-13 ENCOUNTER — Observation Stay (HOSPITAL_COMMUNITY)
Admission: EM | Admit: 2011-01-13 | Discharge: 2011-01-15 | Disposition: A | Discharge status: Home / Self Care | Payer: 59 | Attending: Internal Medicine | Admitting: Internal Medicine

## 2011-01-13 DIAGNOSIS — F329 Major depressive disorder, single episode, unspecified: Secondary | ICD-10-CM | POA: Insufficient documentation

## 2011-01-13 DIAGNOSIS — R5383 Other fatigue: Secondary | ICD-10-CM | POA: Insufficient documentation

## 2011-01-13 DIAGNOSIS — F32A Depression, unspecified: Secondary | ICD-10-CM | POA: Insufficient documentation

## 2011-01-13 DIAGNOSIS — R079 Chest pain, unspecified: Principal | ICD-10-CM

## 2011-01-13 DIAGNOSIS — I10 Essential (primary) hypertension: Secondary | ICD-10-CM | POA: Insufficient documentation

## 2011-01-13 DIAGNOSIS — I119 Hypertensive heart disease without heart failure: Secondary | ICD-10-CM | POA: Insufficient documentation

## 2011-01-13 DIAGNOSIS — E785 Hyperlipidemia, unspecified: Secondary | ICD-10-CM | POA: Insufficient documentation

## 2011-01-13 DIAGNOSIS — E119 Type 2 diabetes mellitus without complications: Secondary | ICD-10-CM

## 2011-01-13 DIAGNOSIS — R5381 Other malaise: Secondary | ICD-10-CM | POA: Insufficient documentation

## 2011-01-13 DIAGNOSIS — K219 Gastro-esophageal reflux disease without esophagitis: Secondary | ICD-10-CM | POA: Insufficient documentation

## 2011-01-13 DIAGNOSIS — R110 Nausea: Secondary | ICD-10-CM | POA: Insufficient documentation

## 2011-01-13 HISTORY — DX: Type 2 diabetes mellitus without complications: E11.9

## 2011-01-13 HISTORY — DX: Gastro-esophageal reflux disease without esophagitis: K21.9

## 2011-01-13 HISTORY — DX: Chest pain, unspecified: R07.9

## 2011-01-13 HISTORY — DX: Hyperlipidemia, unspecified: E78.5

## 2011-01-13 HISTORY — DX: Depression, unspecified: F32.A

## 2011-01-13 HISTORY — DX: Malignant neoplasm of prostate: C61

## 2011-01-13 HISTORY — DX: Major depressive disorder, single episode, unspecified: F32.9

## 2011-01-13 LAB — CBC
Hemoglobin: 15 g/dL (ref 13.0–17.0)
MCH: 32.1 pg (ref 26.0–34.0)
Platelets: 190 10*3/uL (ref 150–400)
RBC: 4.67 MIL/uL (ref 4.22–5.81)

## 2011-01-13 LAB — DIFFERENTIAL
Basophils Relative: 0 % (ref 0–1)
Eosinophils Absolute: 0.1 10*3/uL (ref 0.0–0.7)
Lymphs Abs: 1.7 10*3/uL (ref 0.7–4.0)
Monocytes Relative: 10 % (ref 3–12)
Neutro Abs: 3.9 10*3/uL (ref 1.7–7.7)
Neutrophils Relative %: 62 % (ref 43–77)

## 2011-01-13 LAB — COMPREHENSIVE METABOLIC PANEL
AST: 18 U/L (ref 0–37)
Albumin: 3.5 g/dL (ref 3.5–5.2)
BUN: 12 mg/dL (ref 6–23)
Creatinine, Ser: 0.74 mg/dL (ref 0.50–1.35)
Potassium: 3.3 mEq/L — ABNORMAL LOW (ref 3.5–5.1)
Total Protein: 6.4 g/dL (ref 6.0–8.3)

## 2011-01-13 LAB — APTT: aPTT: 29 seconds (ref 24–37)

## 2011-01-13 LAB — PROTIME-INR
INR: 1.02 (ref 0.00–1.49)
Prothrombin Time: 13.6 seconds (ref 11.6–15.2)

## 2011-01-13 LAB — LIPASE, BLOOD: Lipase: 29 U/L (ref 11–59)

## 2011-01-13 LAB — POCT I-STAT TROPONIN I: Troponin i, poc: 0 ng/mL (ref 0.00–0.08)

## 2011-01-13 MED ORDER — NITROGLYCERIN 2 % TD OINT
1.0000 [in_us] | TOPICAL_OINTMENT | Freq: Once | TRANSDERMAL | Status: AC
  Administered 2011-01-13: 1 [in_us] via TOPICAL
  Filled 2011-01-13: qty 1

## 2011-01-13 MED ORDER — PANTOPRAZOLE SODIUM 40 MG IV SOLR
40.0000 mg | Freq: Once | INTRAVENOUS | Status: AC
  Administered 2011-01-13: 40 mg via INTRAVENOUS
  Filled 2011-01-13: qty 40

## 2011-01-13 MED ORDER — SODIUM CHLORIDE 0.9 % IV SOLN
INTRAVENOUS | Status: DC
  Administered 2011-01-13: 18:00:00 via INTRAVENOUS

## 2011-01-13 NOTE — ED Notes (Signed)
Dr Artis Flock in treatment room

## 2011-01-13 NOTE — ED Provider Notes (Signed)
History     CSN: [id number]  Arrival date & time 01/13/11  1704   First MD Initiated Contact with Patient 01/13/11 1706      Chief Complaint  Patient presents with  . Chest Pain    (Consider location/radiation/quality/duration/timing/severity/associated sxs/prior treatment) Patient is a 60 y.o. male presenting with chest pain. The history is provided by the patient.  Chest Pain The chest pain began yesterday. Chest pain occurs frequently. The chest pain is unchanged. The severity of the pain is mild. Quality: just a pain. The pain does not radiate.     Past Medical History  Diagnosis Date  . 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 Hyperlipidemia   . GERD (gastroesophageal reflux disease) 01/13/2011  . Depression   . Diabetes mellitus type 2, noninsulin dependent 01/13/2011  . Hypertensive heart disease without CHF     Past Surgical History  Procedure Date  . Prostatectomy   . Cardiac catheterization 2005    No CAD    Family History  Problem Relation Age of Onset  . Colon cancer Father   . Cancer Mother   . Stroke Brother     History  Substance Use Topics  . Smoking status: Never Smoker   . Smokeless tobacco: Never Used  . Alcohol Use: No      Review of Systems  Constitutional: Negative.   HENT: Negative.   Respiratory: Negative.   Cardiovascular: Positive for chest pain.  Gastrointestinal: Negative.   Musculoskeletal: Negative.     Allergies  Review of patient's allergies indicates no known allergies.  Home Medications   Current Outpatient Rx  Name Route Sig Dispense Refill  . ASPIRIN 81 MG PO TABS Oral Take 81 mg by mouth daily.      [name] HYDROCHLOROTHIAZIDE PO Oral Take 25 mg by mouth daily.     [name] LISINOPRIL PO Oral Take 5 mg by mouth daily.     [name] GLIMEPIRIDE 2 MG PO TABS Oral Take 2 mg by mouth daily before breakfast.      . METFORMIN HCL 1000 MG PO TABS Oral Take 1,000 mg by mouth 2 (two) times daily with a  meal.      . OMEPRAZOLE 20 MG PO CPDR Oral Take 20 mg by mouth daily.      [name] ROSUVASTATIN CALCIUM 20 MG PO TABS Oral Take 20 mg by mouth daily.      . SERTRALINE HCL 50 MG PO TABS Oral Take 50 mg by mouth daily.        There were no vitals taken for this visit.  Physical Exam  Nursing note and vitals reviewed. Constitutional: He is oriented to person, place, and time. He appears well-developed and well-nourished.  HENT:  Head: Normocephalic.  Eyes: Pupils are equal, round, and reactive to light.  Neck: Normal range of motion. Neck supple.  Cardiovascular: Normal rate, regular rhythm, normal heart sounds and intact distal pulses.   Pulmonary/Chest: Effort normal and breath sounds normal.  Abdominal: Soft. Bowel sounds are normal. He exhibits no mass. There is tenderness. There is rebound. There is no guarding.  Neurological: He is alert and oriented to person, place, and time.  Skin: Skin is warm and dry.    ED Course  Procedures (including critical care time)  Labs Reviewed - No data to display Dg Chest 2 View  01/13/2011  *RADIOLOGY REPORT*  Clinical Data: Mid chest pain, hypertension, diabetes  CHEST - 2 VIEW  Comparison:  02/02/2004  Findings: Upper-normal size of cardiac silhouette. Mediastinal contours and pulmonary vascularity normal. Minimal chronic peribronchial thickening. No pulmonary infiltrate, pleural effusion or pneumothorax. No acute osseous findings.  IMPRESSION: No acute abnormalities.  Original Report Authenticated By: Lollie Marrow, M.D.   Nm Myocar Multi W/spect W/wall Motion / Ef  01/15/2011  *RADIOLOGY REPORT*  Clinical Data:  Chest pain.  MYOCARDIAL IMAGING WITH SPECT (REST AND PHARMACOLOGIC-STRESS) GATED LEFT VENTRICULAR WALL MOTION STUDY LEFT VENTRICULAR EJECTION FRACTION  Technique:  Standard myocardial SPECT imaging was performed after resting intravenous injection of 10 mCi Tc-97m tetrofosmin. Subsequently, intravenous infusion of Lexiscan was performed  under the supervision of the Cardiology staff.  At peak effect of the drug, 30 mCi Tc-30m tetrofosmin was injected intravenously and standard myocardial SPECT  imaging was performed.  Quantitative gated imaging was also performed to evaluate left ventricular wall motion, and estimate left ventricular ejection fraction.  Comparison:  07/25/2003  Findings: SPECT imaging demonstrates no reversible or irreversible defects to suggest ischemia or infarction.  Quantitative gated analysis shows normal wall motion.  The resting left ventricular ejection fraction is 61% with end- diastolic volume of 64 ml and end-systolic volume of 25 ml.  IMPRESSION: No evidence of ischemia or infarction.  Ejection fraction 61%.  Original Report Authenticated By: Cyndie Chime, M.D.     1. Chest pain       MDM  [id number]  , pvc's        Barkley Bruns, MD 01/15/11 1743

## 2011-01-13 NOTE — ED Notes (Signed)
Placed patient on 2 l [place] [facility]

## 2011-01-13 NOTE — ED Notes (Signed)
Called [facility]--no truck.  Called [facility] for transport

## 2011-01-13 NOTE — ED Notes (Signed)
Patient touches epigastric area and reports " bubbling " sensation in epigastric area, also feels "bubbling" in left epigastric area, no chest pain.  Pain between shoulder blades.  Onset of feeling bad was last night.

## 2011-01-13 NOTE — ED Provider Notes (Signed)
History     CSN: [id number]  Arrival date & time 01/13/11  1813   First MD Initiated Contact with Patient 01/13/11 1925      Chief Complaint  Patient presents with  . Chest Pain    chest pain off and on since yesterday. none now. EMS gave 324mg  asa po.    (Consider location/radiation/quality/duration/timing/severity/associated sxs/prior treatment) HPI  Past Medical History  Diagnosis Date  . Hypertension   . Cancer   . Diabetes mellitus     History reviewed. No pertinent past surgical history.  History reviewed. No pertinent family history.  History  Substance Use Topics  . Smoking status: Never Smoker   . Smokeless tobacco: Not on file  . Alcohol Use: No      Review of Systems  Allergies  Review of patient's allergies indicates no known allergies.  Home Medications   Current Outpatient Rx  Name Route Sig Dispense Refill  . ASPIRIN 81 MG PO TABS Oral Take 81 mg by mouth daily.      [name] GLIMEPIRIDE 2 MG PO TABS Oral Take 2 mg by mouth daily before breakfast.      . HYDROCHLOROTHIAZIDE PO Oral Take 25 mg by mouth daily.     [name] LISINOPRIL PO Oral Take 5 mg by mouth daily.     [name] METFORMIN HCL 1000 MG PO TABS Oral Take 1,000 mg by mouth 2 (two) times daily with a meal.      . OMEPRAZOLE 20 MG PO CPDR Oral Take 20 mg by mouth daily.      [name] ROSUVASTATIN CALCIUM 20 MG PO TABS Oral Take 20 mg by mouth daily.      . SERTRALINE HCL 50 MG PO TABS Oral Take 50 mg by mouth daily.        BP 180/89  Pulse 70  Temp(Src) 97.8 F (36.6 C) (Oral)  Resp 20  SpO2 100%  Physical Exam  ED Course  Procedures (including critical care time)  Labs Reviewed  COMPREHENSIVE METABOLIC PANEL - Abnormal; Notable for the following:    Sodium 134 (*)    Potassium 3.3 (*)    Glucose, Bld 132 (*)    All other components within normal limits  CBC  DIFFERENTIAL  APTT  PROTIME-INR  LIPASE, BLOOD  POCT I-STAT TROPONIN I  I-STAT TROPONIN I   Dg Chest 2 View  01/13/2011   *RADIOLOGY REPORT*  Clinical Data: Mid chest pain, hypertension, diabetes  CHEST - 2 VIEW  Comparison: 02/02/2004  Findings: Upper-normal size of cardiac silhouette. Mediastinal contours and pulmonary vascularity normal. Minimal chronic peribronchial thickening. No pulmonary infiltrate, pleural effusion or pneumothorax. No acute osseous findings.  IMPRESSION: No acute abnormalities.  Original Report Authenticated By: Lollie Marrow, M.D.     1. Chest pain       MDM  Remains painfree - heart score of 2 for risk factors, will admit to [facility] for rule out.        Izola Price [place], [place] 01/13/11 2337

## 2011-01-13 NOTE — ED Notes (Signed)
Pt states chest pain off and on since yesterday and is dull aching, none now and states will report any. ems gave 324 mg asa po. Has iv from urgent care.

## 2011-01-13 NOTE — ED Provider Notes (Signed)
History     CSN: [id number]  Arrival date & time 01/13/11  1813   First MD Initiated Contact with Patient 01/13/11 1925      Chief Complaint  Patient presents with  . Chest Pain    chest pain off and on since yesterday. none now. EMS gave 324mg  asa po.    (Consider location/radiation/quality/duration/timing/severity/associated sxs/prior treatment) Patient is a 60 y.o. male presenting with chest pain. The history is provided by the patient. No language interpreter was used.  Chest Pain The chest pain began 6 - 12 hours ago. Chest pain occurs intermittently. Associated with: nothing. The severity of the pain is moderate. The quality of the pain is described as pressure-like and aching. The pain radiates to the upper back. Primary symptoms include fatigue and nausea. Pertinent negatives for primary symptoms include no fever, no syncope, no shortness of breath, no cough, no wheezing, no palpitations, no abdominal pain, no vomiting and no dizziness.  The fatigue began yesterday. The fatigue has been unchanged since its onset.  Pertinent negatives for associated symptoms include no weakness.  Procedure history is positive for exercise treadmill test.     Past Medical History  Diagnosis Date  . Hypertension   . Cancer   . Diabetes mellitus     History reviewed. No pertinent past surgical history.  History reviewed. No pertinent family history.  History  Substance Use Topics  . Smoking status: Never Smoker   . Smokeless tobacco: Not on file  . Alcohol Use: No      Review of Systems  Constitutional: Positive for fatigue. Negative for fever, activity change and appetite change.  HENT: Negative for congestion, sore throat, rhinorrhea, neck pain and neck stiffness.   Respiratory: Negative for cough, shortness of breath and wheezing.   Cardiovascular: Positive for chest pain. Negative for palpitations and syncope.  Gastrointestinal: Positive for nausea. Negative for vomiting and  abdominal pain.  Genitourinary: Negative for dysuria, urgency, frequency and flank pain.  Musculoskeletal: Negative for myalgias, back pain and arthralgias.  Neurological: Negative for dizziness, weakness, light-headedness and headaches.  All other systems reviewed and are negative.    Allergies  Review of patient's allergies indicates no known allergies.  Home Medications   Current Outpatient Rx  Name Route Sig Dispense Refill  . ASPIRIN 81 MG PO TABS Oral Take 81 mg by mouth daily.      [name] GLIMEPIRIDE 2 MG PO TABS Oral Take 2 mg by mouth daily before breakfast.      . HYDROCHLOROTHIAZIDE PO Oral Take 25 mg by mouth daily.     [name] LISINOPRIL PO Oral Take 5 mg by mouth daily.     [name] METFORMIN HCL 1000 MG PO TABS Oral Take 1,000 mg by mouth 2 (two) times daily with a meal.      . OMEPRAZOLE 20 MG PO CPDR Oral Take 20 mg by mouth daily.      [name] ROSUVASTATIN CALCIUM 20 MG PO TABS Oral Take 20 mg by mouth daily.      . SERTRALINE HCL 50 MG PO TABS Oral Take 50 mg by mouth daily.        BP 180/89  Pulse 70  Temp(Src) 97.8 F (36.6 C) (Oral)  Resp 20  SpO2 100%  Physical Exam  Nursing note and vitals reviewed. Constitutional: He is oriented to person, place, and time. He appears well-developed and well-nourished. No distress.  HENT:  Head: Normocephalic and atraumatic.  Mouth/Throat: Oropharynx is clear and moist.  Eyes: Conjunctivae and EOM are normal. Pupils are equal, round, and reactive to light.  Neck: Normal range of motion. Neck supple.  Cardiovascular: Normal rate, regular rhythm, normal heart sounds and intact distal pulses.  Exam reveals no gallop and no friction rub.   No murmur heard. Pulmonary/Chest: Effort normal and breath sounds normal. No respiratory distress. He exhibits no tenderness.  Abdominal: Soft. Bowel sounds are normal. There is no tenderness.  Musculoskeletal: Normal range of motion. He exhibits no tenderness.  Neurological: He is alert and oriented to  person, place, and time. No cranial nerve deficit.  Skin: Skin is warm and dry. No rash noted.    ED Course  Procedures (including critical care time)   Date: 01/13/2011  Rate: 73  Rhythm: normal sinus rhythm and premature ventricular contractions (PVC)  QRS Axis: normal  Intervals: normal  ST/T Wave abnormalities: normal  Conduction Disutrbances:none  Narrative Interpretation:   Old EKG Reviewed: unchanged   Labs Reviewed  CBC  DIFFERENTIAL  APTT  PROTIME-INR  POCT I-STAT TROPONIN I  I-STAT TROPONIN I  COMPREHENSIVE METABOLIC PANEL  LIPASE, BLOOD   No results found.   1. Chest pain       MDM  Chest pain with heart score>2 - too high risk for cdu.  Cardiac workup obtained and placed ntg and protonix for pain control.  Concern for acs v atypical gerd.  Will require admission for formal r/o as has no recent cardiac workup and has never received cath.  Received asa prior to arrival        Dayton Bailiff, MD 01/13/11 2015

## 2011-01-14 ENCOUNTER — Encounter (HOSPITAL_COMMUNITY): Payer: Self-pay | Admitting: Cardiology

## 2011-01-14 LAB — HEMOGLOBIN A1C
Hgb A1c MFr Bld: 6.5 % — ABNORMAL HIGH (ref <5.7)
Mean Plasma Glucose: 140 mg/dL — ABNORMAL HIGH (ref <117)

## 2011-01-14 LAB — GLUCOSE, CAPILLARY
Glucose-Capillary: 100 mg/dL — ABNORMAL HIGH (ref 70–99)
Glucose-Capillary: 138 mg/dL — ABNORMAL HIGH (ref 70–99)
Glucose-Capillary: 87 mg/dL (ref 70–99)
Glucose-Capillary: 97 mg/dL (ref 70–99)

## 2011-01-14 LAB — CARDIAC PANEL(CRET KIN+CKTOT+MB+TROPI)
CK, MB: 2.3 ng/mL (ref 0.3–4.0)
Relative Index: INVALID (ref 0.0–2.5)
Total CK: 79 U/L (ref 7–232)
Troponin I: <0.3 ng/mL (ref <0.30)
Troponin I: <0.3 ng/mL (ref <0.30)

## 2011-01-14 LAB — CBC
HCT: 42.3 % (ref 39.0–52.0)
MCHC: 35.7 g/dL (ref 30.0–36.0)
MCV: 89.4 fL (ref 78.0–100.0)
Platelets: 185 10*3/uL (ref 150–400)
RDW: 12.4 % (ref 11.5–15.5)

## 2011-01-14 MED ORDER — ONDANSETRON HCL 4 MG/2ML IJ SOLN: 4.0000 mg | Freq: Four times a day (QID) | INTRAMUSCULAR | Status: DC | PRN

## 2011-01-14 MED ORDER — HYDROCHLOROTHIAZIDE 25 MG PO TABS
25.0000 mg | ORAL_TABLET | Freq: Every day | ORAL | Status: DC
  Administered 2011-01-14 – 2011-01-15 (×2): 25 mg via ORAL
  Filled 2011-01-14 (×2): qty 1

## 2011-01-14 MED ORDER — LISINOPRIL 5 MG PO TABS
5.0000 mg | ORAL_TABLET | Freq: Every day | ORAL | Status: DC
  Administered 2011-01-14 – 2011-01-15 (×2): 5 mg via ORAL
  Filled 2011-01-14 (×2): qty 1

## 2011-01-14 MED ORDER — INSULIN ASPART 100 UNIT/ML ~~LOC~~ SOLN
0.0000 [IU] | Freq: Three times a day (TID) | SUBCUTANEOUS | Status: DC
  Administered 2011-01-15: 2 [IU] via SUBCUTANEOUS
  Filled 2011-01-14: qty 3

## 2011-01-14 MED ORDER — ENOXAPARIN SODIUM 40 MG/0.4ML ~~LOC~~ SOLN
40.0000 mg | SUBCUTANEOUS | Status: DC
  Administered 2011-01-14 – 2011-01-15 (×2): 40 mg via SUBCUTANEOUS
  Filled 2011-01-14 (×2): qty 0.4

## 2011-01-14 MED ORDER — GLIMEPIRIDE 2 MG PO TABS
2.0000 mg | ORAL_TABLET | Freq: Every day | ORAL | Status: DC
  Administered 2011-01-14 – 2011-01-15 (×2): 2 mg via ORAL
  Filled 2011-01-14 (×3): qty 1

## 2011-01-14 MED ORDER — ONDANSETRON HCL 4 MG PO TABS: 4.0000 mg | ORAL_TABLET | Freq: Four times a day (QID) | ORAL | Status: DC | PRN

## 2011-01-14 MED ORDER — METFORMIN HCL 500 MG PO TABS
1000.0000 mg | ORAL_TABLET | Freq: Two times a day (BID) | ORAL | Status: DC
  Administered 2011-01-14: 1000 mg via ORAL
  Filled 2011-01-14 (×3): qty 2

## 2011-01-14 MED ORDER — MORPHINE SULFATE 2 MG/ML IJ SOLN
2.0000 mg | INTRAMUSCULAR | Status: DC | PRN
  Administered 2011-01-14: 2 mg via INTRAVENOUS
  Filled 2011-01-14: qty 1

## 2011-01-14 MED ORDER — ROSUVASTATIN CALCIUM 20 MG PO TABS
20.0000 mg | ORAL_TABLET | Freq: Every day | ORAL | Status: DC
  Administered 2011-01-14 – 2011-01-15 (×2): 20 mg via ORAL
  Filled 2011-01-14 (×2): qty 1

## 2011-01-14 MED ORDER — POTASSIUM CHLORIDE IN NACL 20-0.9 MEQ/L-% IV SOLN
INTRAVENOUS | Status: DC
  Administered 2011-01-14 (×2): via INTRAVENOUS
  Filled 2011-01-14 (×3): qty 1000

## 2011-01-14 MED ORDER — SERTRALINE HCL 50 MG PO TABS
50.0000 mg | ORAL_TABLET | Freq: Every day | ORAL | Status: DC
  Administered 2011-01-14 – 2011-01-15 (×2): 50 mg via ORAL
  Filled 2011-01-14 (×2): qty 1

## 2011-01-14 MED ORDER — PANTOPRAZOLE SODIUM 40 MG PO TBEC
40.0000 mg | DELAYED_RELEASE_TABLET | Freq: Every day | ORAL | Status: DC
  Administered 2011-01-14 – 2011-01-15 (×2): 40 mg via ORAL
  Filled 2011-01-14 (×2): qty 1

## 2011-01-14 MED ORDER — ASPIRIN 81 MG PO CHEW
81.0000 mg | CHEWABLE_TABLET | Freq: Every day | ORAL | Status: DC
  Administered 2011-01-14 – 2011-01-15 (×2): 81 mg via ORAL
  Filled 2011-01-14 (×3): qty 1

## 2011-01-14 MED ORDER — POTASSIUM CHLORIDE CRYS ER 20 MEQ PO TBCR
40.0000 meq | EXTENDED_RELEASE_TABLET | Freq: Once | ORAL | Status: AC
  Administered 2011-01-14: 40 meq via ORAL
  Filled 2011-01-14: qty 2

## 2011-01-14 NOTE — Consult Note (Signed)
Admit date: 01/13/2011 Name: [name] DOB:  12-26-1950 MRN:  [id number]  Referring Physician: Maretta Bees, MD. Primary Physician: Dr. Selena Batten Primary Cardiologist: Dr. Carolanne Grumbling  Reason for Consultation: Chest pain HPI:   This 60 year old black male has a history of hypertension, diabetes and prostate cancer that was previously treated with a radical prostatectomy. He was seen by Dr. Roger Shelter in  2005 for some chest discomfort and had cardiac catheterization at that time that showed normal coronary arteries. He has seen Dr. Mayford Knife in the past year and describes having had a nuclear stress test about a year ago and was told everything was okay. He does note to being under some tension.  He presented to the urgent medical care center yesterday following the onset of a gurgling sensation in his epigastric area. This was then associated with some sharp pains to radiate into his back. This was more in the epigastric area and he did not have any significant chest discomfort in his anterior chest region. He has not had any exertional related symptoms. He does not have any PND, orthopnea, syncope, or claudication. He has a previous history of reflux esophagitis and is thought to also have esophageal spasm previously. He works as a Consulting civil engineer and denies any other cardiac symptoms. He has continued to have some sensation like he is having some gurgling sensations in his chest.   Past Medical History  Diagnosis Date  . 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 Hyperlipidemia   . GERD (gastroesophageal reflux disease) 01/13/2011  . Depression   . Diabetes mellitus type 2, noninsulin dependent 01/13/2011  . Hypertensive heart disease without CHF       Past Surgical History  Procedure Date  . Prostatectomy   . Cardiac catheterization 2005    No CAD    Allergies:   has no known allergies.   Medications: Prior to Admission medications    Medication Sig Start Date End Date Taking? Authorizing Provider  aspirin 81 MG tablet Take 81 mg by mouth daily.     Yes Historical Provider, MD  glimepiride (AMARYL) 2 MG tablet Take 2 mg by mouth daily before breakfast.     Yes Historical Provider, MD  HYDROCHLOROTHIAZIDE PO Take 25 mg by mouth daily.    Yes Historical Provider, MD  LISINOPRIL PO Take 5 mg by mouth daily.    Yes Historical Provider, MD  metFORMIN (GLUCOPHAGE) 1000 MG tablet Take 1,000 mg by mouth 2 (two) times daily with a meal.     Yes Historical Provider, MD  omeprazole (PRILOSEC) 20 MG capsule Take 20 mg by mouth daily.     Yes Historical Provider, MD  rosuvastatin (CRESTOR) 20 MG tablet Take 20 mg by mouth daily.     Yes Historical Provider, MD  sertraline (ZOLOFT) 50 MG tablet Take 50 mg by mouth daily.     Yes Historical Provider, MD   Family History:  family history includes Cancer in his mother; Colon cancer in his father; and Stroke in his brother.  Social History:   reports that he has never smoked. He has never used smokeless tobacco. He reports that he does not drink alcohol or use illicit drugs.  Review of Systems: He complains of some pain in the left side of his neck associated with tenderness. Other than as noted above the remaining review of systems is unremarkable  Physical Exam: Blood pressure 125/70, pulse 70, temperature 98.3 F (  36.8 C), temperature source Oral, resp. rate 18, height 5\' 7"  (1.702 m), weight 64.501 kg (142 lb 3.2 oz), SpO2 98.00%.    General appearance: alert, cooperative, appears stated age and no distress Head: Normocephalic, without obvious abnormality, atraumatic Eyes: conjunctivae/corneas clear. PERRL, EOM's intact. Fundi benign. Neck: no adenopathy, no carotid bruit, no JVD and supple, symmetrical, trachea midline Lungs: clear to auscultation bilaterally Chest wall: no tenderness Heart: regular rate and rhythm, S1, S2 normal, no murmur, click, rub or gallop Abdomen: soft,  non-tender; bowel sounds normal; no masses,  no organomegaly Rectal: deferred Pulses: 2+ and symmetric Skin: Skin color, texture, turgor normal. No rashes or lesions Lymph nodes: Cervical, supraclavicular, and axillary nodes normal. Neurologic: Alert and oriented X 3, normal strength and tone. Normal symmetric reflexes. Normal coordination and gait  Labs: Results for orders placed during the hospital encounter of 01/13/11 (from the past 24 hour(s))  CBC     Status: Normal   Collection Time   01/13/11  7:18 PM      Component Value Range   WBC 6.2  4.0 - 10.5 (K/uL)   RBC 4.67  4.22 - 5.81 (MIL/uL)   Hemoglobin 15.0  13.0 - 17.0 (g/dL)   HCT 16.1  09.6 - 04.5 (%)   MCV 90.1  78.0 - 100.0 (fL)   MCH 32.1  26.0 - 34.0 (pg)   MCHC 35.6  30.0 - 36.0 (g/dL)   RDW 40.9  81.1 - 91.4 (%)   Platelets 190  150 - 400 (K/uL)  DIFFERENTIAL     Status: Normal   Collection Time   01/13/11  7:18 PM      Component Value Range   Neutrophils Relative 62  43 - 77 (%)   Neutro Abs 3.9  1.7 - 7.7 (K/uL)   Lymphocytes Relative 27  12 - 46 (%)   Lymphs Abs 1.7  0.7 - 4.0 (K/uL)   Monocytes Relative 10  3 - 12 (%)   Monocytes Absolute 0.6  0.1 - 1.0 (K/uL)   Eosinophils Relative 1  0 - 5 (%)   Eosinophils Absolute 0.1  0.0 - 0.7 (K/uL)   Basophils Relative 0  0 - 1 (%)   Basophils Absolute 0.0  0.0 - 0.1 (K/uL)  APTT     Status: Normal   Collection Time   01/13/11  7:18 PM      Component Value Range   aPTT 29  24 - 37 (seconds)  PROTIME-INR     Status: Normal   Collection Time   01/13/11  7:18 PM      Component Value Range   Prothrombin Time 13.6  11.6 - 15.2 (seconds)   INR 1.02  0.00 - 1.49   COMPREHENSIVE METABOLIC PANEL     Status: Abnormal   Collection Time   01/13/11  7:32 PM      Component Value Range   Sodium 134 (*) 135 - 145 (mEq/L)   Potassium 3.3 (*) 3.5 - 5.1 (mEq/L)   Chloride 101  96 - 112 (mEq/L)   CO2 23  19 - 32 (mEq/L)   Glucose, Bld 132 (*) 70 - 99 (mg/dL)   BUN 12  6  - 23 (mg/dL)   Creatinine, Ser 7.82  0.50 - 1.35 (mg/dL)   Calcium 8.6  8.4 - 95.6 (mg/dL)   Total Protein 6.4  6.0 - 8.3 (g/dL)   Albumin 3.5  3.5 - 5.2 (g/dL)   AST 18  0 - 37 (U/L)  ALT 23  0 - 53 (U/L)   Alkaline Phosphatase 82  39 - 117 (U/L)   Total Bilirubin 0.4  0.3 - 1.2 (mg/dL)   GFR calc non Af Amer >90  >90 (mL/min)   GFR calc Af Amer >90  >90 (mL/min)  LIPASE, BLOOD     Status: Normal   Collection Time   01/13/11  7:32 PM      Component Value Range   Lipase 29  11 - 59 (U/L)  POCT I-STAT TROPONIN I     Status: Normal   Collection Time   01/13/11  7:39 PM      Component Value Range   Troponin i, poc 0.00  0.00 - 0.08 (ng/mL)   Comment 3           GLUCOSE, CAPILLARY     Status: Normal   Collection Time   01/14/11 12:33 AM      Component Value Range   Glucose-Capillary 87  70 - 99 (mg/dL)  CBC     Status: Normal   Collection Time   01/14/11  1:48 AM      Component Value Range   WBC 7.4  4.0 - 10.5 (K/uL)   RBC 4.73  4.22 - 5.81 (MIL/uL)   Hemoglobin 15.1  13.0 - 17.0 (g/dL)   HCT 45.4  09.8 - 11.9 (%)   MCV 89.4  78.0 - 100.0 (fL)   MCH 31.9  26.0 - 34.0 (pg)   MCHC 35.7  30.0 - 36.0 (g/dL)   RDW 14.7  82.9 - 56.2 (%)   Platelets 185  150 - 400 (K/uL)  CREATININE, SERUM     Status: Normal   Collection Time   01/14/11  1:48 AM      Component Value Range   Creatinine, Ser 0.78  0.50 - 1.35 (mg/dL)   GFR calc non Af Amer >90  >90 (mL/min)   GFR calc Af Amer >90  >90 (mL/min)  CARDIAC PANEL(CRET KIN+CKTOT+MB+TROPI)     Status: Normal   Collection Time   01/14/11  1:48 AM      Component Value Range   Total CK 82  7 - 232 (U/L)   CK, MB 2.4  0.3 - 4.0 (ng/mL)   Troponin I <0.30  <0.30 (ng/mL)   Relative Index RELATIVE INDEX IS INVALID  0.0 - 2.5   GLUCOSE, CAPILLARY     Status: Abnormal   Collection Time   01/14/11  7:38 AM      Component Value Range   Glucose-Capillary 140 (*) 70 - 99 (mg/dL)  GLUCOSE, CAPILLARY     Status: Abnormal   Collection Time    01/14/11 12:04 PM      Component Value Range   Glucose-Capillary 138 (*) 70 - 99 (mg/dL)      Radiology: Normal chest x-ray   EKG: Voltage criteria for LVH  IMPRESSIONS:  1. Chest pain with some atypical features. He has a previous history of normal coronary arteries at catheterization 7 years ago. Some features suggest that is noncardiac pain and my recommendation would be to start with a Cardiolite stress test 2. Hyperlipidemia 3. Hypertensive heart disease currently well controlled 4. Non-insulin-dependent diabetes mellitus 5. History of esophageal reflux 6. History of Gleason 7 prostate cancer treated with radical prostatectomy  RECOMMENDATION:  I would discontinue the IV and continue to replete his hypokalemia In the absence of any additional chest pain or EKG changes, I would go ahead and start with a Cardiolite  stress test to risk stratify him particularly since he had a negative catheterization 7 years ago.  Signed:  Darden Palmer MD [facility]   Cardiology Consultant 01/14/2011, 12:32 PM

## 2011-01-14 NOTE — H&P (Signed)
PCP:   No primary provider on file.   Chief Complaint:  Chest pain  HPI: This is a 60 year old African American male with a history of diabetes, hypertension, lipidemia, GERD who presents the emergency department with 2-3 weeks of intermittent chest pain that occurs in the lower substernal area and radiates to his back. He rates the pain is mild. The pain usually lasts for one to 2 hours but did not spontaneously resolve today. He has no previous history of myocardial infarction, but has had negative stress tests per the patient.  Cardiac risk factors: Diabetes, hypertension, hyperlipidemia, early MI in first degree relative.  Review of Systems:  The patient denies anorexia, fever, weight loss,, vision loss, decreased hearing, hoarseness, chest pain, syncope, dyspnea on exertion, peripheral edema, balance deficits, hemoptysis, abdominal pain, melena, hematochezia, severe indigestion/heartburn, hematuria, incontinence, genital sores, muscle weakness, suspicious skin lesions, transient blindness, difficulty walking, depression, unusual weight change, abnormal bleeding, enlarged lymph nodes, angioedema, and breast masses.  Past Medical History: Past Medical History  Diagnosis Date  . Hypertension   . Cancer   . Diabetes mellitus    Past Surgical History  Procedure Date  . Prostate surgery     Medications: Prior to Admission medications   Medication Sig Start Date End Date Taking? Authorizing Provider  aspirin 81 MG tablet Take 81 mg by mouth daily.     Yes Historical Provider, MD  glimepiride (AMARYL) 2 MG tablet Take 2 mg by mouth daily before breakfast.     Yes Historical Provider, MD  HYDROCHLOROTHIAZIDE PO Take 25 mg by mouth daily.    Yes Historical Provider, MD  LISINOPRIL PO Take 5 mg by mouth daily.    Yes Historical Provider, MD  metFORMIN (GLUCOPHAGE) 1000 MG tablet Take 1,000 mg by mouth 2 (two) times daily with a meal.     Yes Historical Provider, MD  omeprazole (PRILOSEC)  20 MG capsule Take 20 mg by mouth daily.     Yes Historical Provider, MD  rosuvastatin (CRESTOR) 20 MG tablet Take 20 mg by mouth daily.     Yes Historical Provider, MD  sertraline (ZOLOFT) 50 MG tablet Take 50 mg by mouth daily.     Yes Historical Provider, MD    Allergies:  No Known Allergies  Social History:  reports that he has never smoked. He does not have any smokeless tobacco history on file. He reports that he does not drink alcohol or use illicit drugs.  Family History: Patient's 17 year old sister is deceased of myocardial infarction. There is a family history of stroke and colon cancer.  Physical Exam: Filed Vitals:   01/13/11 1829  BP: 180/89  Pulse: 70  Temp: 97.8 F (36.6 C)  TempSrc: Oral  Resp: 20  SpO2: 100%   General appearance: alert, cooperative, appears stated age and no distress Head: Normocephalic, without obvious abnormality, atraumatic Eyes: conjunctivae/corneas clear. PERRL, EOM's intact. Fundi benign. Ears: normal TM's and external ear canals both ears Throat: lips, mucosa, and tongue normal; teeth and gums normal Neck: no adenopathy, no carotid bruit, no JVD, supple, symmetrical, trachea midline and thyroid not enlarged, symmetric, no tenderness/mass/nodules Resp: clear to auscultation bilaterally Chest wall: no tenderness Cardio: regular rate and rhythm, S1, S2 normal, no murmur, click, rub or gallop GI: soft, non-tender; bowel sounds normal; no masses,  no organomegaly Extremities: extremities normal, atraumatic, no cyanosis or edema Pulses: 2+ and symmetric Skin: Skin color, texture, turgor normal. No rashes or lesions Lymph nodes: Cervical, supraclavicular, and axillary nodes  normal. Neurologic: Grossly normal   Labs on Admission:   [facility] 01/13/11 1932  NA 134*  K 3.3*  CL 101  CO2 23  GLUCOSE 132*  BUN 12  CREATININE 0.74  CALCIUM 8.6  MG --  PHOS --    Basename 01/13/11 1932  AST 18  ALT 23  ALKPHOS 82  BILITOT 0.4    PROT 6.4  ALBUMIN 3.5    Basename 01/13/11 1932  LIPASE 29  AMYLASE --    Basename 01/13/11 1918  WBC 6.2  NEUTROABS 3.9  HGB 15.0  HCT 42.1  MCV 90.1  PLT 190   Radiological Exams on Admission: Dg Chest 2 View  01/13/2011  *RADIOLOGY REPORT*  Clinical Data: Mid chest pain, hypertension, diabetes  CHEST - 2 VIEW  Comparison: 02/02/2004  Findings: Upper-normal size of cardiac silhouette. Mediastinal contours and pulmonary vascularity normal. Minimal chronic peribronchial thickening. No pulmonary infiltrate, pleural effusion or pneumothorax. No acute osseous findings.  IMPRESSION: No acute abnormalities.  Original Report Authenticated By: Lollie Marrow, M.D.    Assessment/Plan #1 chest pain rule out cardiac origin #2 diabetes type 2 #3 hypertension #4 hyperlipidemia #5 GERD #6 depression  Admit the patient to telemetry.  Will continue to rule out myocardial infarction with serial enzymes.  Will obtain a fasting lipid panel as well as stress test.  Will continue the patient's home meds. Full code. Lovenox for DVT prophylaxis.  Eloyce Bultman JEHIEL 01/14/2011, 12:14 AM

## 2011-01-14 NOTE — Progress Notes (Signed)
  Echocardiogram 2D Echocardiogram has been performed.  Jorje Guild [facility] 01/14/2011, 11:44 AM

## 2011-01-14 NOTE — Progress Notes (Signed)
PATIENT DETAILS Name: [name] Age: 60 y.o. Sex: male Date of Birth: 1950-12-25 Admit Date: 01/13/2011 PCP:No primary provider on file.  Subjective: No further chest pain.  Objective: Vital signs in last 24 hours: Filed Vitals:   01/13/11 1829 01/14/11 0019 01/14/11 0048 01/14/11 0500  BP: 180/89 133/74 139/73 125/70  Pulse: 70 69 73 70  Temp: 97.8 F (36.6 C)  98.2 F (36.8 C) 98.3 F (36.8 C)  TempSrc: Oral  Oral Oral  Resp: 20 16 20 18   Height:   5\' 7"  (1.702 m)   Weight:   64.501 kg (142 lb 3.2 oz)   SpO2: 100% 100% 100% 98%    Weight change:   Body mass index is 22.27 kg/(m^2).  Intake/Output from previous day:  Intake/Output Summary (Last 24 hours) at 01/14/11 1136 Last data filed at 01/14/11 0700  Gross per 24 hour  Intake    240 ml  Output    350 ml  Net   -110 ml    PHYSICAL EXAM: Gen Exam: Awake and alert with clear speech.   Neck: Supple, No JVD.   Chest: B/L Clear.   CVS: S1 S2 Regular, no murmurs.  Abdomen: soft, BS +, non tender, non distended.  Extremities: no edema, lower extremities warm to touch. Neurologic: Non Focal.   Skin: No Rash.   Wounds: N/A.    CONSULTS:  cardiology  LAB RESULTS: CBC  Lab 01/14/11 0148 01/13/11 1918  WBC 7.4 6.2  HGB 15.1 15.0  HCT 42.3 42.1  PLT 185 190  MCV 89.4 90.1  MCH 31.9 32.1  MCHC 35.7 35.6  RDW 12.4 12.8  LYMPHSABS -- 1.7  MONOABS -- 0.6  EOSABS -- 0.1  BASOSABS -- 0.0  BANDABS -- --    Chemistries   Lab 01/14/11 0148 01/13/11 1932  NA -- 134*  K -- 3.3*  CL -- 101  CO2 -- 23  GLUCOSE -- 132*  BUN -- 12  CREATININE 0.78 0.74  CALCIUM -- 8.6  MG -- --    GFR Estimated Creatinine Clearance: 87.3 ml/min (by C-G formula based on Cr of 0.78).  Coagulation profile  Lab 01/13/11 1918  INR 1.02  PROTIME --    Cardiac Enzymes  Lab 01/14/11 0148  CKMB 2.4  TROPONINI <0.30  MYOGLOBIN --    No components found with this basename: POCBNP:3 No results found for  this basename: DDIMER:2 in the last 72 hours No results found for this basename: HGBA1C:2 in the last 72 hours No results found for this basename: CHOL:2,HDL:2,LDLCALC:2,TRIG:2,CHOLHDL:2,LDLDIRECT:2 in the last 72 hours No results found for this basename: TSH,T4TOTAL,FREET3,T3FREE,THYROIDAB in the last 72 hours No results found for this basename: VITAMINB12:2,FOLATE:2,FERRITIN:2,TIBC:2,IRON:2,RETICCTPCT:2 in the last 72 hours  Basename 01/13/11 1932  LIPASE 29  AMYLASE --    Urine Studies No results found for this basename: UACOL:2,UAPR:2,USPG:2,UPH:2,UTP:2,UGL:2,UKET:2,UBIL:2,UHGB:2,UNIT:2,UROB:2,ULEU:2,UEPI:2,UWBC:2,URBC:2,UBAC:2,CAST:2,CRYS:2,UCOM:2,BILUA:2 in the last 72 hours  MICROBIOLOGY: No results found for this or any previous visit (from the past 240 hour(s)).  RADIOLOGY STUDIES/RESULTS: Dg Chest 2 View  01/13/2011  *RADIOLOGY REPORT*  Clinical Data: Mid chest pain, hypertension, diabetes  CHEST - 2 VIEW  Comparison: 02/02/2004  Findings: Upper-normal size of cardiac silhouette. Mediastinal contours and pulmonary vascularity normal. Minimal chronic peribronchial thickening. No pulmonary infiltrate, pleural effusion or pneumothorax. No acute osseous findings.  IMPRESSION: No acute abnormalities.  Original Report Authenticated By: Lollie Marrow, M.D.    MEDICATIONS: Scheduled Meds:   . aspirin  81 mg Oral Daily  . enoxaparin  40 mg Subcutaneous Q24H  . glimepiride  2 mg Oral QAC breakfast  . hydrochlorothiazide  25 mg Oral Daily  . lisinopril  5 mg Oral Daily  . metFORMIN  1,000 mg Oral BID WC  . nitroGLYCERIN  1 inch Topical Once  . pantoprazole  40 mg Oral Q1200  . pantoprazole (PROTONIX) IV  40 mg Intravenous Once  . rosuvastatin  20 mg Oral Daily  . sertraline  50 mg Oral Daily   Continuous Infusions:   . 0.9 % NaCl with KCl 20 mEq / L 75 mL/hr at 01/14/11 0244   PRN Meds:.morphine, ondansetron (ZOFRAN) IV, ondansetron  Antibiotics: Anti-infectives    None        Assessment/Plan: Patient Active Hospital Problem List: Chest pain    Assessment: Presented with chest pain-with both typical and atypical features. Currently chest pain-free.    Plan: Given significant cardiac risk factors we'll consult cardiology. Cardiac enzymes negative so far.  Diabetes mellitus    Assessment: CBGs stable.    Plan: Hold metformin, continue with Amaryl, with add SSI.   Hypertension    Assessment: Controlled.    Plan: Continue with hydrochlorothiazide, and lisinopril.  Dyslipidemia   Assessment: No indication to repeat lipids this admission. Unless patient makes cardiac enzymes.   Plan: Continue with statin.  GERD   Assessment: Wonder if this is the culprit behind patient's chest pain.   Plan: Continue PPI.  Depression   Assessment: Stable   Plan: Continue with Zoloft.   Disposition: Remain inpatient.  DVT Prophylaxis: Lovenox.  Code Status: Full code.  Maretta Bees,  MD. 01/14/2011, 11:36 AM

## 2011-01-15 ENCOUNTER — Inpatient Hospital Stay (HOSPITAL_COMMUNITY): Payer: 59

## 2011-01-15 ENCOUNTER — Encounter (HOSPITAL_COMMUNITY): Payer: Self-pay | Admitting: *Deleted

## 2011-01-15 MED ORDER — TECHNETIUM TC 99M TETROFOSMIN IV KIT
30.0000 | PACK | Freq: Once | INTRAVENOUS | Status: AC | PRN
  Administered 2011-01-15: 30 via INTRAVENOUS

## 2011-01-15 MED ORDER — TECHNETIUM TC 99M TETROFOSMIN IV KIT
10.0000 | PACK | Freq: Once | INTRAVENOUS | Status: AC | PRN
  Administered 2011-01-15: 10 via INTRAVENOUS

## 2011-01-15 NOTE — Progress Notes (Signed)
Treadmill Cardiolite done without comps.  No EKG changes.  Images to follow.  Ameliya Nicotra JR,W SPENCER

## 2011-01-15 NOTE — Discharge Summary (Signed)
PATIENT DETAILS Name: [name] Age: 60 y.o. Sex: male Date of Birth: 09-24-1950 MRN: [id number]. Admit Date: 01/13/2011 Admitting Physician: Jeoffrey Massed WUJ:[id number],WGNFA, MD, MD  PRIMARY DISCHARGE DIAGNOSIS:  Principal Problem:  *Chest pain-Likely non-cardiac Active Problems:  Hypertensive heart disease without CHF DM HTN GERD      PAST MEDICAL HISTORY: Past Medical History  Diagnosis Date  . 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 Hyperlipidemia   . GERD (gastroesophageal reflux disease) 01/13/2011  . Depression   . Diabetes mellitus type 2, noninsulin dependent 01/13/2011  . Hypertensive heart disease without CHF     DISCHARGE MEDICATIONS: Current Discharge Medication List    CONTINUE these medications which have NOT CHANGED   Details  aspirin 81 MG tablet Take 81 mg by mouth daily.      glimepiride (AMARYL) 2 MG tablet Take 2 mg by mouth daily before breakfast.      HYDROCHLOROTHIAZIDE PO Take 25 mg by mouth daily.     LISINOPRIL PO Take 5 mg by mouth daily.     metFORMIN (GLUCOPHAGE) 1000 MG tablet Take 1,000 mg by mouth 2 (two) times daily with a meal.      omeprazole (PRILOSEC) 20 MG capsule Take 20 mg by mouth daily.      rosuvastatin (CRESTOR) 20 MG tablet Take 20 mg by mouth daily.      sertraline (ZOLOFT) 50 MG tablet Take 50 mg by mouth daily.           BRIEF HPI:  See H&P, Labs, Consult and Test reports for all details in brief, patient was admitted for chest pain, for further details please see the H&P done on admission  CONSULTATIONS:   cardiology  PERTINENT RADIOLOGIC STUDIES: Dg Chest 2 View  01/13/2011  *RADIOLOGY REPORT*  Clinical Data: Mid chest pain, hypertension, diabetes  CHEST - 2 VIEW  Comparison: 02/02/2004  Findings: Upper-normal size of cardiac silhouette. Mediastinal contours and pulmonary vascularity normal. Minimal chronic peribronchial thickening. No pulmonary infiltrate, pleural  effusion or pneumothorax. No acute osseous findings.  IMPRESSION: No acute abnormalities.  Original Report Authenticated By: Lollie Marrow, M.D.   Nm Myocar Multi W/spect W/wall Motion / Ef  01/15/2011  *RADIOLOGY REPORT*  Clinical Data:  Chest pain.  MYOCARDIAL IMAGING WITH SPECT (REST AND PHARMACOLOGIC-STRESS) GATED LEFT VENTRICULAR WALL MOTION STUDY LEFT VENTRICULAR EJECTION FRACTION  Technique:  Standard myocardial SPECT imaging was performed after resting intravenous injection of 10 mCi Tc-83m tetrofosmin. Subsequently, intravenous infusion of Lexiscan was performed under the supervision of the Cardiology staff.  At peak effect of the drug, 30 mCi Tc-9m tetrofosmin was injected intravenously and standard myocardial SPECT  imaging was performed.  Quantitative gated imaging was also performed to evaluate left ventricular wall motion, and estimate left ventricular ejection fraction.  Comparison:  07/25/2003  Findings: SPECT imaging demonstrates no reversible or irreversible defects to suggest ischemia or infarction.  Quantitative gated analysis shows normal wall motion.  The resting left ventricular ejection fraction is 61% with end- diastolic volume of 64 ml and end-systolic volume of 25 ml.  IMPRESSION: No evidence of ischemia or infarction.  Ejection fraction 61%.  Original Report Authenticated By: Cyndie Chime, M.D.     PERTINENT LAB RESULTS: CBC:  Basename 01/14/11 0148 01/13/11 1918  WBC 7.4 6.2  HGB 15.1 15.0  HCT 42.3 42.1  PLT 185 190   CMET CMP     Component Value Date/Time   NA 134* 01/13/2011  1932   K 3.3* 01/13/2011 1932   CL 101 01/13/2011 1932   CO2 23 01/13/2011 1932   GLUCOSE 132* 01/13/2011 1932   BUN 12 01/13/2011 1932   CREATININE 0.78 01/14/2011 0148   CALCIUM 8.6 01/13/2011 1932   PROT 6.4 01/13/2011 1932   ALBUMIN 3.5 01/13/2011 1932   AST 18 01/13/2011 1932   ALT 23 01/13/2011 1932   ALKPHOS 82 01/13/2011 1932   BILITOT 0.4 01/13/2011 1932   GFRNONAA  >90 01/14/2011 0148   GFRAA >90 01/14/2011 0148    GFR Estimated Creatinine Clearance: 87.3 ml/min (by C-G formula based on Cr of 0.78).  Basename 01/13/11 1932  LIPASE 29  AMYLASE --    Basename 01/14/11 1744 01/14/11 1132 01/14/11 0148  CKTOTAL 78 79 82  CKMB 2.2 2.3 2.4  CKMBINDEX -- -- --  TROPONINI <0.30 <0.30 <0.30   No components found with this basename: POCBNP:3 No results found for this basename: DDIMER:2 in the last 72 hours  Basename 01/14/11 1132  HGBA1C 6.5*   No results found for this basename: CHOL:2,HDL:2,LDLCALC:2,TRIG:2,CHOLHDL:2,LDLDIRECT:2 in the last 72 hours No results found for this basename: TSH,T4TOTAL,FREET3,T3FREE,THYROIDAB in the last 72 hours No results found for this basename: VITAMINB12:2,FOLATE:2,FERRITIN:2,TIBC:2,IRON:2,RETICCTPCT:2 in the last 72 hours Coags:  Basename 01/13/11 1918  INR 1.02   Microbiology: No results found for this or any previous visit (from the past 240 hour(s)).   BRIEF HOSPITAL COURSE:   Principal Problem:  *Chest pain - patient has multiple risk factors, therefore underwent a stress test, which was negative. Cardiology was consulted, and Lexiscan was arranged.  Active Problems:  Hypertensive heart disease without CHF - controlled, continue with usual medications  DM -continue with prior medications.  GERD -continue with PPI     TODAY-DAY OF DISCHARGE:  Subjective:   [name] today has no headache,no chest abdominal pain,no new weakness tingling or numbness, feels much better wants to go home today.   Objective:   Blood pressure 175/104, pulse 106, temperature 97.5 F (36.4 C), temperature source Oral, resp. rate 18, height 5\' 7"  (1.702 m), weight 64.501 kg (142 lb 3.2 oz), SpO2 100.00%.  Intake/Output Summary (Last 24 hours) at 01/15/11 1443 Last data filed at 01/15/11 0600  Gross per 24 hour  Intake 1046.25 ml  Output      0 ml  Net 1046.25 ml    Exam Awake Alert, Oriented *3, No new  F.N deficits, Normal affect [place].AT,PERRAL Supple Neck,No JVD, No cervical lymphadenopathy appriciated.  Symmetrical Chest wall movement, Good air movement bilaterally, CTAB RRR,No Gallops,Rubs or new Murmurs, No Parasternal Heave +ve B.Sounds, Abd Soft, Non tender, No organomegaly appriciated, No rebound -guarding or rigidity. No Cyanosis, Clubbing or edema, No new Rash or bruise  DISPOSITION: Home  DISCHARGE INSTRUCTIONS:    Follow-up Information    Follow up with [facility], [place]. Make an appointment in 2 weeks.   Contact information:   [address] [place] [address] [phone]         Total Time spent on discharge equals 45 minutes.  SignedJeoffrey Massed 01/15/2011 2:43 PM

## 2011-01-15 NOTE — Progress Notes (Signed)
Pt discharged to home via wheelchair with family after myoview today. Dicharge instructions given and pt verbalized understanding. No further questions at this time.

## 2011-11-29 ENCOUNTER — Encounter: Payer: 59 | Attending: Internal Medicine | Admitting: *Deleted

## 2011-11-29 ENCOUNTER — Encounter: Payer: Self-pay | Admitting: *Deleted

## 2011-11-29 VITALS — Ht 68.0 in | Wt 147.1 lb

## 2011-11-29 DIAGNOSIS — Z713 Dietary counseling and surveillance: Secondary | ICD-10-CM | POA: Insufficient documentation

## 2011-11-29 DIAGNOSIS — E119 Type 2 diabetes mellitus without complications: Secondary | ICD-10-CM | POA: Insufficient documentation

## 2011-11-29 NOTE — Progress Notes (Signed)
  Medical Nutrition Therapy:  Appt start time: 1030 end time:  1130.  Assessment:  Primary concerns today: patient here for diabetes education. He states history of DM2 for 10-12 years and his current A1c  was 7.1% on 11/14/2011. He states he does have hypoglycemia usually before lunch when it occurs. He also states he SMBG twice daily before breakfast and after supper.  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   MEDICATIONS: see list. Diabetes medications include Amaryl and Metformin   DIETARY INTAKE:  Usual eating pattern includes 3 meals and 1-2 snacks per day.  Everyday foods include good variety of all food groups.  Avoided foods include sweets and much bread.    24-hr recall:  B ( AM): sausage biscuit, OR oatmeal with fruit at fast food, OR raisin bran cereal with 2% milk, hot tea with honey and lemon Snk ( AM): none OR peanuts OR PNB crackers OR fresh fruit L ( PM): home for lunch, fish, salad OR grilled chicken OR pasta OR burger / hot dog occasionally, unsweet tea with lemonade or hot tea Snk ( PM): occasionally chicken nuggets x 4 D ( PM): prepares himself, baked fish, infrequently a starch, salad or frozen french fries, OR raisin bran cereal with 2% milk, water or hot tea Snk ( PM): ice cream x 1 cup occasionally with fruit cocktail or plain Beverages: hot tea, unsweet tea, water  Usual physical activity: walk 3-4 miles several times a week at the park  Estimated energy needs: 1600 calories 180 g carbohydrates 120 g protein 44 g fat  Progress Towards Goal(s):  In progress.   Nutritional Diagnosis:  NB-1.1 Food and nutrition-related knowledge deficit As related to diabetes management.  As evidenced by A1c of 7.1%.    Intervention:  Nutrition counseling and diabetes education initiated. Discussed basic physiology of diabetes, SMBG and rationale of checking BG at alternate times of day, A1c, Carb Counting and reading food labels,  and benefits of increased activity. Plan: Aim for 3 Carb Choices (45 grams) per meal +/- 1 either way Aim for 0-2 Carbs per snack if hungry Consider making sure you get adequate carb choices at breakfast considering the Amaryl will increase your insulin production and lower your BG Consider reading food labels for total carbohydrate of foods in your home Continue with walking 3-4 miles as able several days a week Continue checking your BG twice a day, alternating between before and after meals  Handouts given during visit include: Living Well with Diabetes Carb Counting and Food Label handouts Meal Plan Card  Monitoring/Evaluation:  Dietary intake, exercise, reading food labels, and body weight prn.

## 2011-11-29 NOTE — Patient Instructions (Addendum)
Plan: Aim for 3 Carb Choices (45 grams) per meal +/- 1 either way Aim for 0-2 Carbs per snack if hungry Consider making sure you get adequate carb choices at breakfast considering the Amaryl will increase your insulin production and lower your BG Consider reading food labels for total carbohydrate of foods in your home Continue with walking 3-4 miles as able several days a week Continue checking your BG twice a day, alternating between before and after meals

## 2012-02-21 ENCOUNTER — Other Ambulatory Visit: Payer: Self-pay | Admitting: Gastroenterology

## 2012-03-17 ENCOUNTER — Encounter: Payer: Self-pay | Admitting: *Deleted

## 2012-07-21 ENCOUNTER — Emergency Department (HOSPITAL_BASED_OUTPATIENT_CLINIC_OR_DEPARTMENT_OTHER): Payer: Commercial Managed Care - PPO

## 2012-07-21 ENCOUNTER — Other Ambulatory Visit: Payer: Self-pay

## 2012-07-21 ENCOUNTER — Encounter (HOSPITAL_BASED_OUTPATIENT_CLINIC_OR_DEPARTMENT_OTHER): Payer: Self-pay | Admitting: Family Medicine

## 2012-07-21 ENCOUNTER — Emergency Department (HOSPITAL_BASED_OUTPATIENT_CLINIC_OR_DEPARTMENT_OTHER)
Admission: EM | Admit: 2012-07-21 | Discharge: 2012-07-21 | Disposition: A | Discharge status: Home / Self Care | Payer: Commercial Managed Care - PPO | Attending: Emergency Medicine | Admitting: Emergency Medicine

## 2012-07-21 DIAGNOSIS — R531 Weakness: Secondary | ICD-10-CM

## 2012-07-21 DIAGNOSIS — F329 Major depressive disorder, single episode, unspecified: Secondary | ICD-10-CM | POA: Insufficient documentation

## 2012-07-21 DIAGNOSIS — I119 Hypertensive heart disease without heart failure: Secondary | ICD-10-CM | POA: Insufficient documentation

## 2012-07-21 DIAGNOSIS — F3289 Other specified depressive episodes: Secondary | ICD-10-CM | POA: Insufficient documentation

## 2012-07-21 DIAGNOSIS — R55 Syncope and collapse: Secondary | ICD-10-CM | POA: Insufficient documentation

## 2012-07-21 DIAGNOSIS — E119 Type 2 diabetes mellitus without complications: Secondary | ICD-10-CM | POA: Insufficient documentation

## 2012-07-21 DIAGNOSIS — R200 Anesthesia of skin: Secondary | ICD-10-CM

## 2012-07-21 DIAGNOSIS — Z8546 Personal history of malignant neoplasm of prostate: Secondary | ICD-10-CM | POA: Insufficient documentation

## 2012-07-21 DIAGNOSIS — Z7982 Long term (current) use of aspirin: Secondary | ICD-10-CM | POA: Insufficient documentation

## 2012-07-21 DIAGNOSIS — I519 Heart disease, unspecified: Secondary | ICD-10-CM | POA: Insufficient documentation

## 2012-07-21 DIAGNOSIS — Z79899 Other long term (current) drug therapy: Secondary | ICD-10-CM | POA: Insufficient documentation

## 2012-07-21 DIAGNOSIS — E785 Hyperlipidemia, unspecified: Secondary | ICD-10-CM | POA: Insufficient documentation

## 2012-07-21 DIAGNOSIS — R5383 Other fatigue: Secondary | ICD-10-CM | POA: Insufficient documentation

## 2012-07-21 DIAGNOSIS — R5381 Other malaise: Secondary | ICD-10-CM | POA: Insufficient documentation

## 2012-07-21 DIAGNOSIS — K219 Gastro-esophageal reflux disease without esophagitis: Secondary | ICD-10-CM | POA: Insufficient documentation

## 2012-07-21 LAB — CBC WITH DIFFERENTIAL/PLATELET
Basophils Relative: 0 % (ref 0–1)
Hemoglobin: 15 g/dL (ref 13.0–17.0)
Lymphs Abs: 1.2 10*3/uL (ref 0.7–4.0)
Monocytes Relative: 13 % — ABNORMAL HIGH (ref 3–12)
Neutro Abs: 2.6 10*3/uL (ref 1.7–7.7)
Neutrophils Relative %: 57 % (ref 43–77)
RBC: 4.61 MIL/uL (ref 4.22–5.81)

## 2012-07-21 LAB — URINALYSIS, ROUTINE W REFLEX MICROSCOPIC
Bilirubin Urine: NEGATIVE
Glucose, UA: 250 mg/dL — AB
Hgb urine dipstick: NEGATIVE
Ketones, ur: 15 mg/dL — AB
Nitrite: NEGATIVE
pH: 7 (ref 5.0–8.0)

## 2012-07-21 LAB — COMPREHENSIVE METABOLIC PANEL
ALT: 36 U/L (ref 0–53)
Albumin: 3.8 g/dL (ref 3.5–5.2)
Alkaline Phosphatase: 76 U/L (ref 39–117)
BUN: 11 mg/dL (ref 6–23)
Chloride: 102 mEq/L (ref 96–112)
Potassium: 3.7 mEq/L (ref 3.5–5.1)
Total Bilirubin: 0.6 mg/dL (ref 0.3–1.2)

## 2012-07-21 LAB — GLUCOSE, CAPILLARY: Glucose-Capillary: 117 mg/dL — ABNORMAL HIGH (ref 70–99)

## 2012-07-21 LAB — TROPONIN I: Troponin I: <0.3 ng/mL (ref <0.30)

## 2012-07-21 NOTE — ED Provider Notes (Signed)
History    CSN: [id number] Arrival date & time 07/21/12  0728  First MD Initiated Contact with Patient 07/21/12 0745     Chief Complaint  Patient presents with  . Numbness   (Consider location/radiation/quality/duration/timing/severity/associated sxs/prior Treatment) HPI Comments: Patient with pmh of htn, dm.  Presents with complaints of waking up "not feeling well" this morning.  He reports numbness of the forehead, right side of face and mouth.  He also feels discomfort across the upper lumbar area.  He checked his blood pressure which was high, then checked his sugar which was okay.  He called his daughter and asked her to bring him here.  He denies to me he is experiencing any weakness, or loss of coordination or balance.  No headache, chest pain, or shortness of breath.    The history is provided by the patient.   Past Medical History  Diagnosis Date  . 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 Hyperlipidemia   . GERD (gastroesophageal reflux disease) 01/13/2011  . Depression   . Diabetes mellitus type 2, noninsulin dependent 01/13/2011  . Hypertensive heart disease without CHF    Past Surgical History  Procedure Laterality Date  . Prostatectomy    . Cardiac catheterization  2005    No CAD   Family History  Problem Relation Age of Onset  . Colon cancer Father   . Cancer Mother   . Stroke Brother    History  Substance Use Topics  . Smoking status: Never Smoker   . Smokeless tobacco: Never Used  . Alcohol Use: No    Review of Systems  Constitutional: Positive for fatigue. Negative for fever and chills.  Respiratory: Negative for cough and shortness of breath.   Cardiovascular: Negative for chest pain, palpitations and leg swelling.  Gastrointestinal: Negative for nausea, vomiting and diarrhea.  Neurological: Positive for numbness. Negative for dizziness, facial asymmetry, weakness and headaches.  All other systems reviewed and are  negative.    Allergies  Review of patient's allergies indicates no known allergies.  Home Medications   Current Outpatient Rx  Name  Route  Sig  Dispense  Refill  . aspirin 81 MG tablet   Oral   Take 81 mg by mouth daily.           [name] glimepiride (AMARYL) 2 MG tablet   Oral   Take 2 mg by mouth daily before breakfast.           . HYDROCHLOROTHIAZIDE PO   Oral   Take 25 mg by mouth daily.          [name] LISINOPRIL PO   Oral   Take 5 mg by mouth daily.          . metFORMIN (GLUCOPHAGE) 1000 MG tablet   Oral   Take 1,000 mg by mouth 2 (two) times daily with a meal.           . multivitamin-iron-minerals-folic acid (CENTRUM) chewable tablet   Oral   Chew 1 tablet by mouth daily.         [name] omeprazole (PRILOSEC) 20 MG capsule   Oral   Take 20 mg by mouth daily.           . rosuvastatin (CRESTOR) 20 MG tablet   Oral   Take 20 mg by mouth daily.           . sertraline (ZOLOFT) 50 MG tablet   Oral  Take 50 mg by mouth daily.            BP 180/90  Pulse 61  Temp(Src) 98.6 F (37 C) (Oral)  Resp 20  Ht 5\' 8"  (1.727 m)  Wt 146 lb (66.225 kg)  BMI 22.2 kg/m2  SpO2 100% Physical Exam  Nursing note and vitals reviewed. Constitutional: He is oriented to person, place, and time. He appears well-developed and well-nourished. No distress.  HENT:  Head: Normocephalic and atraumatic.  Mouth/Throat: Oropharynx is clear and moist.  Eyes: EOM are normal. Pupils are equal, round, and reactive to light.  Neck: Normal range of motion. Neck supple.  Cardiovascular: Normal rate, regular rhythm and normal heart sounds.   No murmur heard. Pulmonary/Chest: Effort normal and breath sounds normal. No respiratory distress. He has no wheezes.  Abdominal: Soft. Bowel sounds are normal. He exhibits no distension. There is no tenderness.  Musculoskeletal: Normal range of motion. He exhibits no edema.  Lymphadenopathy:    He has no cervical adenopathy.  Neurological: He  is alert and oriented to person, place, and time. No cranial nerve deficit. He exhibits normal muscle tone. Coordination normal.  Skin: Skin is warm and dry. He is not diaphoretic.    ED Course  Procedures (including critical care time) Labs Reviewed  GLUCOSE, CAPILLARY - Abnormal; Notable for the following:    Glucose-Capillary 117 (*)    All other components within normal limits   No results found. No diagnosis found.    Date: 07/21/2012  Rate: 57  Rhythm: sinus bradycardia  QRS Axis: normal  Intervals: normal  ST/T Wave abnormalities: normal  Conduction Disutrbances:none  Narrative Interpretation:   Old EKG Reviewed: none available    MDM  The patient presents here with complaints of generalized malaise, facial numbness that he now tells me has been occurring intermittently for one month.  The workup is essentially unremarkable, revealing no evidence for any emergent pathology, most notably there is no evidence for stroke or cardiovascular etiology.  His blood pressure was initially elevated, but has since improved.  He appears very stable for discharge.  I will recommend he follow his blood pressure and sugars and follow up with his pcp later this week.  He will return if he worsens or develops new symptoms.        Geoffery Lyons, MD 07/21/12 [phone]

## 2012-07-21 NOTE — ED Notes (Signed)
Pt c/o numbness to forehead, mouth, right hand "off and on" x 1 month but "seems worse today". Pt also c/o elevated bp and back pain "worse than usual". No facial asymmetry noted, equal grips and leg strength.

## 2013-01-15 ENCOUNTER — Emergency Department (HOSPITAL_BASED_OUTPATIENT_CLINIC_OR_DEPARTMENT_OTHER): Payer: Commercial Managed Care - PPO

## 2013-01-15 ENCOUNTER — Emergency Department (HOSPITAL_BASED_OUTPATIENT_CLINIC_OR_DEPARTMENT_OTHER)
Admission: EM | Admit: 2013-01-15 | Discharge: 2013-01-15 | Disposition: A | Discharge status: Home / Self Care | Payer: Commercial Managed Care - PPO | Attending: Emergency Medicine | Admitting: Emergency Medicine

## 2013-01-15 ENCOUNTER — Encounter (HOSPITAL_BASED_OUTPATIENT_CLINIC_OR_DEPARTMENT_OTHER): Payer: Self-pay | Admitting: Emergency Medicine

## 2013-01-15 DIAGNOSIS — E119 Type 2 diabetes mellitus without complications: Secondary | ICD-10-CM | POA: Insufficient documentation

## 2013-01-15 DIAGNOSIS — F329 Major depressive disorder, single episode, unspecified: Secondary | ICD-10-CM | POA: Insufficient documentation

## 2013-01-15 DIAGNOSIS — E785 Hyperlipidemia, unspecified: Secondary | ICD-10-CM | POA: Insufficient documentation

## 2013-01-15 DIAGNOSIS — R200 Anesthesia of skin: Secondary | ICD-10-CM

## 2013-01-15 DIAGNOSIS — K219 Gastro-esophageal reflux disease without esophagitis: Secondary | ICD-10-CM | POA: Insufficient documentation

## 2013-01-15 DIAGNOSIS — Z8546 Personal history of malignant neoplasm of prostate: Secondary | ICD-10-CM | POA: Insufficient documentation

## 2013-01-15 DIAGNOSIS — R209 Unspecified disturbances of skin sensation: Secondary | ICD-10-CM | POA: Insufficient documentation

## 2013-01-15 DIAGNOSIS — F3289 Other specified depressive episodes: Secondary | ICD-10-CM | POA: Insufficient documentation

## 2013-01-15 DIAGNOSIS — Z79899 Other long term (current) drug therapy: Secondary | ICD-10-CM | POA: Insufficient documentation

## 2013-01-15 DIAGNOSIS — Z7982 Long term (current) use of aspirin: Secondary | ICD-10-CM | POA: Insufficient documentation

## 2013-01-15 DIAGNOSIS — M542 Cervicalgia: Secondary | ICD-10-CM

## 2013-01-15 DIAGNOSIS — I119 Hypertensive heart disease without heart failure: Secondary | ICD-10-CM | POA: Insufficient documentation

## 2013-01-15 DIAGNOSIS — Z95818 Presence of other cardiac implants and grafts: Secondary | ICD-10-CM | POA: Insufficient documentation

## 2013-01-15 LAB — CBC WITH DIFFERENTIAL/PLATELET
Basophils Relative: 1 % (ref 0–1)
Eosinophils Absolute: 0.1 10*3/uL (ref 0.0–0.7)
Eosinophils Relative: 2 % (ref 0–5)
HCT: 41.8 % (ref 39.0–52.0)
Hemoglobin: 14.4 g/dL (ref 13.0–17.0)
Lymphs Abs: 1.8 10*3/uL (ref 0.7–4.0)
MCH: 31.1 pg (ref 26.0–34.0)
MCHC: 34.4 g/dL (ref 30.0–36.0)
MCV: 90.3 fL (ref 78.0–100.0)
Monocytes Absolute: 0.6 10*3/uL (ref 0.1–1.0)
Monocytes Relative: 12 % (ref 3–12)

## 2013-01-15 LAB — COMPREHENSIVE METABOLIC PANEL
Albumin: 3.7 g/dL (ref 3.5–5.2)
BUN: 8 mg/dL (ref 6–23)
Creatinine, Ser: 0.8 mg/dL (ref 0.50–1.35)
GFR calc Af Amer: >90 mL/min (ref >90)
Glucose, Bld: 175 mg/dL — ABNORMAL HIGH (ref 70–99)
Total Protein: 6.8 g/dL (ref 6.0–8.3)

## 2013-01-15 MED ORDER — HYDROCODONE-ACETAMINOPHEN 5-325 MG PO TABS: 1.0000 | ORAL_TABLET | Freq: Four times a day (QID) | ORAL | Status: DC | PRN

## 2013-01-15 NOTE — ED Notes (Signed)
Pt sts numbness to hands, around lips, and all over head. Denies N/V/D, fever, chills, weakness, or headache. Also c/o pain in left lateroposterior neck, which worsens when he turns head in certain direction.

## 2013-01-15 NOTE — ED Provider Notes (Signed)
CSN: [id number]     Arrival date & time 01/15/13  1504 History   First MD Initiated Contact with Patient 01/15/13 1525     Chief Complaint  Patient presents with  . Numbness   (Consider location/radiation/quality/duration/timing/severity/associated sxs/prior Treatment) The history is provided by the patient.   62 year old male presents with 30 days of intermittent numbness around the lips and over his head into both hands and arms. Not associated with nausea vomiting diarrhea fever chills weakness or headache. Patient also complains of some left-sided lateral neck pain which worsens when he turns his head in certain directions. Patient's had a history of this numbness in the past last time was June of this year but is also had symptoms prior to that. Sounds clinically like his never had an MRI and never been seen by neurology.  Past Medical History  Diagnosis Date  . 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 Hyperlipidemia   . GERD (gastroesophageal reflux disease) 01/13/2011  . Depression   . Diabetes mellitus type 2, noninsulin dependent 01/13/2011  . Hypertensive heart disease without CHF    Past Surgical History  Procedure Laterality Date  . Prostatectomy    . Cardiac catheterization  2005    No CAD   Family History  Problem Relation Age of Onset  . Colon cancer Father   . Cancer Mother   . Stroke Brother    History  Substance Use Topics  . Smoking status: Never Smoker   . Smokeless tobacco: Never Used  . Alcohol Use: No    Review of Systems  Constitutional: Negative for fatigue.  HENT: Negative for congestion.   Eyes: Negative for redness.  Respiratory: Negative for shortness of breath.   Cardiovascular: Negative for chest pain.  Gastrointestinal: Negative for nausea, vomiting and abdominal pain.  Genitourinary: Negative for dysuria.  Musculoskeletal: Positive for neck pain. Negative for back pain and neck stiffness.  Skin: Negative  for rash.  Neurological: Positive for numbness. Negative for facial asymmetry, speech difficulty, weakness and headaches.  Hematological: Does not bruise/bleed easily.  Psychiatric/Behavioral: Negative for confusion.    Allergies  Review of patient's allergies indicates no known allergies.  Home Medications   Current Outpatient Rx  Name  Route  Sig  Dispense  Refill  . aspirin 81 MG tablet   Oral   Take 81 mg by mouth daily.           [name] glimepiride (AMARYL) 2 MG tablet   Oral   Take 2 mg by mouth daily before breakfast.           . HYDROCHLOROTHIAZIDE PO   Oral   Take 25 mg by mouth daily.          [name] HYDROcodone-acetaminophen (NORCO/VICODIN) 5-325 MG per tablet   Oral   Take 1-2 tablets by mouth every 6 (six) hours as needed for moderate pain.   14 tablet   0   . LISINOPRIL PO   Oral   Take 5 mg by mouth daily.          . metFORMIN (GLUCOPHAGE) 1000 MG tablet   Oral   Take 1,000 mg by mouth 2 (two) times daily with a meal.           . multivitamin-iron-minerals-folic acid (CENTRUM) chewable tablet   Oral   Chew 1 tablet by mouth daily.         [name] omeprazole (PRILOSEC) 20 MG capsule   Oral  Take 20 mg by mouth daily.           . rosuvastatin (CRESTOR) 20 MG tablet   Oral   Take 20 mg by mouth once a week.          . sertraline (ZOLOFT) 50 MG tablet   Oral   Take 50 mg by mouth daily.            BP 169/83  Pulse 66  Temp(Src) 97.9 F (36.6 C) (Oral)  Resp 20  Ht 5\' 8"  (1.727 m)  Wt 147 lb (66.679 kg)  BMI 22.36 kg/m2  SpO2 99% Physical Exam  Nursing note and vitals reviewed. Constitutional: He is oriented to person, place, and time. He appears well-developed and well-nourished. No distress.  HENT:  Head: Atraumatic.  Mouth/Throat: Oropharynx is clear and moist.  Eyes: Conjunctivae and EOM are normal. Pupils are equal, round, and reactive to light.  Neck: Normal range of motion. Neck supple.  Cardiovascular: Normal rate, regular  rhythm and normal heart sounds.   No murmur heard. Pulmonary/Chest: Effort normal and breath sounds normal.  Abdominal: Soft. Bowel sounds are normal. There is no tenderness.  Musculoskeletal: Normal range of motion.  Neurological: He is alert and oriented to person, place, and time. No cranial nerve deficit. He exhibits normal muscle tone. Coordination normal.  Skin: Skin is warm. No erythema.    ED Course  Procedures (including critical care time) Labs Review Labs Reviewed  COMPREHENSIVE METABOLIC PANEL - Abnormal; Notable for the following:    Glucose, Bld 175 (*)    All other components within normal limits  CBC WITH DIFFERENTIAL   Imaging Review Ct Head Wo Contrast  01/15/2013   CLINICAL DATA:  Numbness.  Headache.  Prostate cancer 2006  EXAM: CT HEAD WITHOUT CONTRAST  CT CERVICAL SPINE WITHOUT CONTRAST  TECHNIQUE: Multidetector CT imaging of the head and cervical spine was performed following the standard protocol without intravenous contrast. Multiplanar CT image reconstructions of the cervical spine were also generated.  COMPARISON:  Multiple prior studies.  Most recent head CT 07/21/2012  FINDINGS: CT HEAD FINDINGS  Generalized atrophy. Mild chronic microvascular ischemia. Negative for acute infarct. Negative for hemorrhage.  Sclerotic process in the central and left skullbase including the sphenoid bone unchanged from studies extending back to 10/05/2006. This is most likely a benign bone lesion and has an unlikely appearance for metastatic prostate cancer. This could be a chondroid lesion.  CT CERVICAL SPINE FINDINGS  Normal cervical alignment. Negative for fracture or mass. Multilevel degenerative changes are present in the cervical spine.  Central disc protrusion at C3-4. Central disc protrusion and left-sided osteophyte at C4-5 causing left foraminal encroachment. Diffuse spondylosis and foraminal encroachment bilaterally at C5-6. Mild spondylosis at C6-7.  IMPRESSION: No acute  intracranial abnormality. Mild chronic microvascular ischemic change. Chronic and stable skullbase lesion on the left unchanged from 2008.  Cervical spondylosis.  No acute cervical spine abnormality.   Electronically Signed   By: Marlan Palau M.D.   On: 01/15/2013 16:18   Ct Cervical Spine Wo Contrast  01/15/2013   CLINICAL DATA:  Numbness.  Headache.  Prostate cancer 2006  EXAM: CT HEAD WITHOUT CONTRAST  CT CERVICAL SPINE WITHOUT CONTRAST  TECHNIQUE: Multidetector CT imaging of the head and cervical spine was performed following the standard protocol without intravenous contrast. Multiplanar CT image reconstructions of the cervical spine were also generated.  COMPARISON:  Multiple prior studies.  Most recent head CT 07/21/2012  FINDINGS: CT HEAD  FINDINGS  Generalized atrophy. Mild chronic microvascular ischemia. Negative for acute infarct. Negative for hemorrhage.  Sclerotic process in the central and left skullbase including the sphenoid bone unchanged from studies extending back to 10/05/2006. This is most likely a benign bone lesion and has an unlikely appearance for metastatic prostate cancer. This could be a chondroid lesion.  CT CERVICAL SPINE FINDINGS  Normal cervical alignment. Negative for fracture or mass. Multilevel degenerative changes are present in the cervical spine.  Central disc protrusion at C3-4. Central disc protrusion and left-sided osteophyte at C4-5 causing left foraminal encroachment. Diffuse spondylosis and foraminal encroachment bilaterally at C5-6. Mild spondylosis at C6-7.  IMPRESSION: No acute intracranial abnormality. Mild chronic microvascular ischemic change. Chronic and stable skullbase lesion on the left unchanged from 2008.  Cervical spondylosis.  No acute cervical spine abnormality.   Electronically Signed   By: Marlan Palau M.D.   On: 01/15/2013 16:18    EKG Interpretation   None       MDM   1. Numbness   2. Neck pain    Patient with recurrent numbness to  the face and to both arms. On and off for about a month getting worse in the last few days. Patient's had similar symptoms in the past with negative workups was never had an MRI never been evaluated by neurology. The patient's workup today head CT without any new changes CT of neck without any significant findings basic labs are normal. Discuss with patient recommend neurology referral by primary care doctor and MRI ordered just outpatient by primary care Dr. for further evaluation. Pain medication provided as needed for the neck pain. Symptoms are not consistent with a stroke based on the negative head CT however MRI evaluation would be important. If it is a stroke is been present for a period of time. Clinically symptoms are suggestive of MS since they come and go and they've occurred in the past.    Shelda Jakes, MD 01/15/13 [phone]

## 2013-03-25 ENCOUNTER — Encounter: Payer: Self-pay | Admitting: Neurology

## 2013-03-25 DIAGNOSIS — I10 Essential (primary) hypertension: Secondary | ICD-10-CM | POA: Insufficient documentation

## 2013-03-26 ENCOUNTER — Encounter: Payer: Self-pay | Admitting: Neurology

## 2013-03-26 ENCOUNTER — Ambulatory Visit (INDEPENDENT_AMBULATORY_CARE_PROVIDER_SITE_OTHER): Payer: Medicare Other | Admitting: Neurology

## 2013-03-26 ENCOUNTER — Encounter (INDEPENDENT_AMBULATORY_CARE_PROVIDER_SITE_OTHER): Payer: Self-pay

## 2013-03-26 VITALS — BP 146/88 | HR 67 | Ht 66.75 in | Wt 147.8 lb

## 2013-03-26 DIAGNOSIS — Z8546 Personal history of malignant neoplasm of prostate: Secondary | ICD-10-CM

## 2013-03-26 DIAGNOSIS — R519 Headache, unspecified: Secondary | ICD-10-CM | POA: Insufficient documentation

## 2013-03-26 DIAGNOSIS — R51 Headache: Secondary | ICD-10-CM

## 2013-03-26 HISTORY — DX: Headache, unspecified: R51.9

## 2013-03-26 MED ORDER — NORTRIPTYLINE HCL 10 MG PO CAPS: ORAL_CAPSULE | ORAL | Status: DC

## 2013-03-26 NOTE — Progress Notes (Signed)
PATIENT: [name] DOB: Oct 07, 1950  HISTORICAL  [name] is a 63 years old right-handed African American male, referred by his primary care physician Dr. Leonides Schanz for evaluation of headaches  He had past medical history of prostate cancer about 10 years ago, status post prostatectomy,  Over the past 5 years, around 2010-2011, he began to have frequent headaches, bilateral frontal, vertex low-grade pressure headaches daily, sometimes exacerbated to a more severe pressure headaches, he was evaluated by outside neurologist Dr. Catalina Gravel in March 2011, was given prescription of Relafen 750 mg twice a day, had a CAT scan of the brain there was description of sclerotic process at the skull base,  He continue have daily mild pressure headaches, presented to emergency room December 2014 for an episode of more severe headaches, he had a repeat CAT scan of the brain, we were able to review the films together, which showed generalized atrophy. Mild chronic microvascular ischemia. Negative  for acute infarct. Sclerotic process in the central and left skullbase including the sphenoid bone unchanged from studies extending back to 10/05/2006.  This is most likely a benign bone lesion and has an unlikely appearance for metastatic prostate cancer. This could be a chondroid lesion.  CT CERVICAL showed diffuse spondylosis and foraminal encroachment bilaterally at C5-6. Mild spondylosis at C6-7.   He has been taking Tylenol, ibuprofen as needed couple times a week for his headache, which is helpful sometimes   REVIEW OF SYSTEMS: Full 14 system review of systems performed and notable only for feeling cold, headache, numbness, urination problems, impotent  ALLERGIES: No Known Allergies   HOME MEDICATIONS: Current Outpatient Prescriptions on File Prior to Visit  Medication Sig Dispense Refill  . acetaminophen (TYLENOL) 325 MG tablet Take 650 mg by mouth every 6 (six) hours as needed.      [name] aspirin  81 MG tablet Take 81 mg by mouth daily.        . carvedilol (COREG) 6.25 MG tablet Take 6.25 mg by mouth 2 (two) times daily with a meal.      . glimepiride (AMARYL) 2 MG tablet Take 2 mg by mouth daily before breakfast.        . LISINOPRIL PO Take 10 mg by mouth daily.       . metFORMIN (GLUCOPHAGE) 1000 MG tablet Take 1,000 mg by mouth 2 (two) times daily with a meal.        . multivitamin-iron-minerals-folic acid (CENTRUM) chewable tablet Chew 1 tablet by mouth daily.      [name] omeprazole (PRILOSEC) 20 MG capsule Take 20 mg by mouth daily.        . pantoprazole (PROTONIX) 40 MG tablet Take 40 mg by mouth daily.       No current facility-administered medications on file prior to visit.    PAST MEDICAL HISTORY: Past Medical History  Diagnosis Date  . 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 Hyperlipidemia   . GERD (gastroesophageal reflux disease) 01/13/2011  . Depression   . Diabetes mellitus type 2, noninsulin dependent 01/13/2011  . Hypertensive heart disease without CHF   . High blood pressure     PAST SURGICAL HISTORY: Past Surgical History  Procedure Laterality Date  . Prostatectomy    . Cardiac catheterization  2005    FAMILY HISTORY: Family History  Problem Relation Age of Onset  . Colon cancer Father   . Cancer Mother   . Stroke Brother  SOCIAL HISTORY:  History   Social History  . Marital Status: Legally Separated    Spouse Name: N/A    Number of Children: 5  . Years of Education: 12   Occupational History  . BUS DRIVER    Social History Main Topics  . Smoking status: Never Smoker   . Smokeless tobacco: Never Used  . Alcohol Use: No  . Drug Use: No  . Sexual Activity: Not on file   Other Topics Concern  . Not on file   Social History Narrative   Patient is divorced and lives alone.   Patient has five children.   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     Patient has a high  school education.   Patient is right-handed.   Patient drinks two cups of tea daily.     PHYSICAL EXAM   Filed Vitals:   03/26/13 0834  BP: 146/88  Pulse: 67  Height: 5' 6.75" (1.695 m)  Weight: 147 lb 12 oz (67.019 kg)     Body mass index is 23.33 kg/(m^2).   Generalized: In no acute distress  Neck: Supple, no carotid bruits   Cardiac: Regular rate rhythm  Pulmonary: Clear to auscultation bilaterally  Musculoskeletal: No deformity  Neurological examination  Mentation: Alert oriented to time, place, history taking, and causual conversation  Cranial nerve II-XII: Pupils were equal round reactive to light. Extraocular movements were full.  Visual field were full on confrontational test. Bilateral fundi were sharp.  Facial sensation and strength were normal. Hearing was intact to finger rubbing bilaterally. Uvula tongue midline.  Head turning and shoulder shrug and were normal and symmetric.Tongue protrusion into cheek strength was normal.  Motor: Normal tone, bulk and strength.  Sensory: Intact to fine touch, pinprick, preserved vibratory sensation, and proprioception at toes.  Coordination: Normal finger to nose, heel-to-shin bilaterally there was no truncal ataxia  Gait: Rising up from seated position without assistance, normal stance, without trunk ataxia, moderate stride, good arm swing, smooth turning, able to perform tiptoe, and heel walking without difficulty.   Romberg signs: Negative  Deep tendon reflexes: Brachioradialis 2/2, biceps 2/2, triceps 2/2, patellar 2/2, Achilles 2/2, plantar responses were flexor bilaterally.   DIAGNOSTIC DATA (LABS, IMAGING, TESTING) - I reviewed patient records, labs, notes, testing and imaging myself where available.  Lab Results  Component Value Date   WBC 5.0 01/15/2013   HGB 14.4 01/15/2013   HCT 41.8 01/15/2013   MCV 90.3 01/15/2013   PLT 181 01/15/2013      Component Value Date/Time   NA 135 01/15/2013 1605   K  3.6 01/15/2013 1605   CL 100 01/15/2013 1605   CO2 27 01/15/2013 1605   GLUCOSE 175* 01/15/2013 1605   BUN 8 01/15/2013 1605   CREATININE 0.80 01/15/2013 1605   CALCIUM 8.6 01/15/2013 1605   PROT 6.8 01/15/2013 1605   ALBUMIN 3.7 01/15/2013 1605   AST 24 01/15/2013 1605   ALT 48 01/15/2013 1605   ALKPHOS 85 01/15/2013 1605   BILITOT 0.5 01/15/2013 1605   GFRNONAA >90 01/15/2013 1605   GFRAA >90 01/15/2013 1605   No results found for this basename: CHOL, HDL, LDLCALC, LDLDIRECT, TRIG, CHOLHDL   Lab Results  Component Value Date   HGBA1C 6.5* 01/14/2011     ASSESSMENT AND PLAN  [name] is a 63 y.o. male complains of  few years history of daily pressure headaches, history of prostate cancer, CAT scan  of the brain showed sclerosis of skull base,Small vessel disease  1 differentiation diagnosis including Tension headaches, need to rule out venous thrombosis,  2 proceed with MRI of the brain with and without contrast, and MRV.  3 nortriptyline, 10 mg, titrating to 20 mg every night. Marcial Pacas, M.D. Ph.D.  [facility] [address] [place], [place] [address] [phone]

## 2013-04-09 ENCOUNTER — Ambulatory Visit
Admission: RE | Admit: 2013-04-09 | Discharge: 2013-04-09 | Disposition: A | Discharge status: Home / Self Care | Payer: Medicare Other | Source: Ambulatory Visit | Attending: Neurology | Admitting: Neurology

## 2013-04-09 DIAGNOSIS — Z8546 Personal history of malignant neoplasm of prostate: Secondary | ICD-10-CM

## 2013-04-09 DIAGNOSIS — R519 Headache, unspecified: Secondary | ICD-10-CM

## 2013-04-09 DIAGNOSIS — R51 Headache: Principal | ICD-10-CM

## 2013-04-09 MED ORDER — GADOBENATE DIMEGLUMINE 529 MG/ML IV SOLN
14.0000 mL | Freq: Once | INTRAVENOUS | Status: AC | PRN
  Administered 2013-04-09: 14 mL via INTRAVENOUS

## 2013-04-13 ENCOUNTER — Telehealth: Payer: Self-pay | Admitting: Nurse Practitioner

## 2013-04-13 ENCOUNTER — Telehealth: Payer: Self-pay | Admitting: Neurology

## 2013-04-13 NOTE — Telephone Encounter (Signed)
Patient would like a call back on the results of his recent x-rays. Please call to advise.

## 2013-04-13 NOTE — Telephone Encounter (Signed)
Please call patient, MRI of the brain showed age related changes, no acute lesions

## 2013-04-13 NOTE — Telephone Encounter (Signed)
Spoke with patient and he had wants MRI results, explained that results were not ready as of today, he verbalized understanding

## 2013-04-14 NOTE — Telephone Encounter (Signed)
Called pt to inform him per Dr. Krista Blue that his MRI of the brain showed age related changes, no acute lesions. I advised the pt that if he has any other problems, questions or concerns to call the office. Pt verbalized understanding.

## 2013-04-15 ENCOUNTER — Telehealth: Payer: Self-pay | Admitting: Neurology

## 2013-04-15 NOTE — Telephone Encounter (Signed)
Informed patient of his age related changes MRI, he verbalized understanding

## 2013-04-15 NOTE — Telephone Encounter (Signed)
Please call patient, MRI of the brain showed age-related changes,

## 2013-04-27 ENCOUNTER — Emergency Department (HOSPITAL_BASED_OUTPATIENT_CLINIC_OR_DEPARTMENT_OTHER)
Admission: EM | Admit: 2013-04-27 | Discharge: 2013-04-27 | Disposition: A | Discharge status: Home / Self Care | Payer: Medicare Other | Attending: Emergency Medicine | Admitting: Emergency Medicine

## 2013-04-27 ENCOUNTER — Encounter (HOSPITAL_BASED_OUTPATIENT_CLINIC_OR_DEPARTMENT_OTHER): Payer: Self-pay | Admitting: Emergency Medicine

## 2013-04-27 ENCOUNTER — Emergency Department (HOSPITAL_BASED_OUTPATIENT_CLINIC_OR_DEPARTMENT_OTHER): Payer: Medicare Other

## 2013-04-27 DIAGNOSIS — Z9889 Other specified postprocedural states: Secondary | ICD-10-CM | POA: Insufficient documentation

## 2013-04-27 DIAGNOSIS — M545 Low back pain, unspecified: Secondary | ICD-10-CM | POA: Insufficient documentation

## 2013-04-27 DIAGNOSIS — F3289 Other specified depressive episodes: Secondary | ICD-10-CM | POA: Insufficient documentation

## 2013-04-27 DIAGNOSIS — M549 Dorsalgia, unspecified: Secondary | ICD-10-CM

## 2013-04-27 DIAGNOSIS — R079 Chest pain, unspecified: Secondary | ICD-10-CM | POA: Insufficient documentation

## 2013-04-27 DIAGNOSIS — Z7982 Long term (current) use of aspirin: Secondary | ICD-10-CM | POA: Insufficient documentation

## 2013-04-27 DIAGNOSIS — Z8546 Personal history of malignant neoplasm of prostate: Secondary | ICD-10-CM | POA: Insufficient documentation

## 2013-04-27 DIAGNOSIS — E119 Type 2 diabetes mellitus without complications: Secondary | ICD-10-CM | POA: Insufficient documentation

## 2013-04-27 DIAGNOSIS — E785 Hyperlipidemia, unspecified: Secondary | ICD-10-CM | POA: Insufficient documentation

## 2013-04-27 DIAGNOSIS — I119 Hypertensive heart disease without heart failure: Secondary | ICD-10-CM | POA: Insufficient documentation

## 2013-04-27 DIAGNOSIS — K219 Gastro-esophageal reflux disease without esophagitis: Secondary | ICD-10-CM | POA: Insufficient documentation

## 2013-04-27 DIAGNOSIS — R109 Unspecified abdominal pain: Secondary | ICD-10-CM | POA: Insufficient documentation

## 2013-04-27 DIAGNOSIS — Z79899 Other long term (current) drug therapy: Secondary | ICD-10-CM | POA: Insufficient documentation

## 2013-04-27 DIAGNOSIS — F329 Major depressive disorder, single episode, unspecified: Secondary | ICD-10-CM | POA: Insufficient documentation

## 2013-04-27 LAB — COMPREHENSIVE METABOLIC PANEL
ALT: 41 U/L (ref 0–53)
AST: 20 U/L (ref 0–37)
Albumin: 3.9 g/dL (ref 3.5–5.2)
Alkaline Phosphatase: 77 U/L (ref 39–117)
BUN: 13 mg/dL (ref 6–23)
CO2: 25 mEq/L (ref 19–32)
CREATININE: 0.7 mg/dL (ref 0.50–1.35)
Calcium: 8.9 mg/dL (ref 8.4–10.5)
Chloride: 102 mEq/L (ref 96–112)
GFR calc Af Amer: >90 mL/min (ref >90)
GFR calc non Af Amer: >90 mL/min (ref >90)
Glucose, Bld: 132 mg/dL — ABNORMAL HIGH (ref 70–99)
Potassium: 4.4 mEq/L (ref 3.7–5.3)
Sodium: 138 mEq/L (ref 137–147)
TOTAL PROTEIN: 6.9 g/dL (ref 6.0–8.3)
Total Bilirubin: 0.7 mg/dL (ref 0.3–1.2)

## 2013-04-27 LAB — URINALYSIS, ROUTINE W REFLEX MICROSCOPIC
Bilirubin Urine: NEGATIVE
Glucose, UA: >1000 mg/dL — AB
HGB URINE DIPSTICK: NEGATIVE
Ketones, ur: 15 mg/dL — AB
Leukocytes, UA: NEGATIVE
Nitrite: NEGATIVE
Protein, ur: 30 mg/dL — AB
SPECIFIC GRAVITY, URINE: 1.031 — AB (ref 1.005–1.030)
UROBILINOGEN UA: 1 mg/dL (ref 0.0–1.0)
pH: 6 (ref 5.0–8.0)

## 2013-04-27 LAB — CBC WITH DIFFERENTIAL/PLATELET
BASOS ABS: 0 10*3/uL (ref 0.0–0.1)
BASOS PCT: 0 % (ref 0–1)
EOS PCT: 1 % (ref 0–5)
Eosinophils Absolute: 0.1 10*3/uL (ref 0.0–0.7)
HEMATOCRIT: 45 % (ref 39.0–52.0)
Hemoglobin: 15.8 g/dL (ref 13.0–17.0)
Lymphocytes Relative: 30 % (ref 12–46)
Lymphs Abs: 1.5 10*3/uL (ref 0.7–4.0)
MCH: 31.7 pg (ref 26.0–34.0)
MCHC: 35.1 g/dL (ref 30.0–36.0)
MCV: 90.2 fL (ref 78.0–100.0)
MONO ABS: 0.6 10*3/uL (ref 0.1–1.0)
Monocytes Relative: 12 % (ref 3–12)
NEUTROS ABS: 2.8 10*3/uL (ref 1.7–7.7)
Neutrophils Relative %: 56 % (ref 43–77)
Platelets: 220 10*3/uL (ref 150–400)
RBC: 4.99 MIL/uL (ref 4.22–5.81)
RDW: 12.4 % (ref 11.5–15.5)
WBC: 5 10*3/uL (ref 4.0–10.5)

## 2013-04-27 LAB — TROPONIN I

## 2013-04-27 LAB — URINE MICROSCOPIC-ADD ON

## 2013-04-27 LAB — LIPASE, BLOOD: Lipase: 23 U/L (ref 11–59)

## 2013-04-27 MED ORDER — IOHEXOL 300 MG/ML  SOLN
50.0000 mL | Freq: Once | INTRAMUSCULAR | Status: AC | PRN
  Administered 2013-04-27: 50 mL via ORAL

## 2013-04-27 MED ORDER — SODIUM CHLORIDE 0.9 % IV BOLUS (SEPSIS)
500.0000 mL | Freq: Once | INTRAVENOUS | Status: AC
  Administered 2013-04-27: 500 mL via INTRAVENOUS

## 2013-04-27 MED ORDER — MORPHINE SULFATE 4 MG/ML IJ SOLN
2.0000 mg | Freq: Once | INTRAMUSCULAR | Status: AC
  Administered 2013-04-27: 2 mg via INTRAVENOUS
  Filled 2013-04-27: qty 1

## 2013-04-27 MED ORDER — IOHEXOL 300 MG/ML  SOLN
100.0000 mL | Freq: Once | INTRAMUSCULAR | Status: AC | PRN
  Administered 2013-04-27: 100 mL via INTRAVENOUS

## 2013-04-27 NOTE — ED Notes (Signed)
Pt reports lower bilateral back pain radiating around into his lower abdomen.  Hx of same-has seen a doctor for it and nothing was found to be causing it.  States that this episode has been ongoing since Friday.

## 2013-04-27 NOTE — Discharge Instructions (Signed)
- Hold metformin for 48 hours - Drink fluids and rest  - Follow-up with your doctor today  - Return to the emergency department if you develop any changing/worsening condition, repeated vomiting, fever, blood in your stool/vomit, or any other concerns (please read additional information regarding your condition below)  Back Pain, Adult Low back pain is very common. About 1 in 5 people have back pain.The cause of low back pain is rarely dangerous. The pain often gets better over time.About half of people with a sudden onset of back pain feel better in just 2 weeks. About 8 in 10 people feel better by 6 weeks.  CAUSES Some common causes of back pain include:  Strain of the muscles or ligaments supporting the spine.  Wear and tear (degeneration) of the spinal discs.  Arthritis.  Direct injury to the back. DIAGNOSIS Most of the time, the direct cause of low back pain is not known.However, back pain can be treated effectively even when the exact cause of the pain is unknown.Answering your caregiver's questions about your overall health and symptoms is one of the most accurate ways to make sure the cause of your pain is not dangerous. If your caregiver needs more information, he or she may order lab work or imaging tests (X-rays or MRIs).However, even if imaging tests show changes in your back, this usually does not require surgery. HOME CARE INSTRUCTIONS For many people, back pain returns.Since low back pain is rarely dangerous, it is often a condition that people can learn to [facility] their own.   Remain active. It is stressful on the back to sit or stand in one place. Do not sit, drive, or stand in one place for more than 30 minutes at a time. Take short walks on level surfaces as soon as pain allows.Try to increase the length of time you walk each day.  Do not stay in bed.Resting more than 1 or 2 days can delay your recovery.  Do not avoid exercise or work.Your body is made to  move.It is not dangerous to be active, even though your back may hurt.Your back will likely heal faster if you return to being active before your pain is gone.  Pay attention to your body when you bend and lift. Many people have less discomfortwhen lifting if they bend their knees, keep the load close to their bodies,and avoid twisting. Often, the most comfortable positions are those that put less stress on your recovering back.  Find a comfortable position to sleep. Use a firm mattress and lie on your side with your knees slightly bent. If you lie on your back, put a pillow under your knees.  Only take over-the-counter or prescription medicines as directed by your caregiver. Over-the-counter medicines to reduce pain and inflammation are often the most helpful.Your caregiver may prescribe muscle relaxant drugs.These medicines help dull your pain so you can more quickly return to your normal activities and healthy exercise.  Put ice on the injured area.  Put ice in a plastic bag.  Place a towel between your skin and the bag.  Leave the ice on for 15-20 minutes, 03-04 times a day for the first 2 to 3 days. After that, ice and heat may be alternated to reduce pain and spasms.  Ask your caregiver about trying back exercises and gentle massage. This may be of some benefit.  Avoid feeling anxious or stressed.Stress increases muscle tension and can worsen back pain.It is important to recognize when you are anxious or  stressed and learn ways to manage it.Exercise is a great option. SEEK MEDICAL CARE IF:  You have pain that is not relieved with rest or medicine.  You have pain that does not improve in 1 week.  You have new symptoms.  You are generally not feeling well. SEEK IMMEDIATE MEDICAL CARE IF:   You have pain that radiates from your back into your legs.  You develop new bowel or bladder control problems.  You have unusual weakness or numbness in your arms or legs.  You  develop nausea or vomiting.  You develop abdominal pain.  You feel faint. Document Released: 01/08/2005 Document Revised: 07/10/2011 Document Reviewed: 05/29/2010 [facility] Patient Information 2014 [place], [place].    Abdominal Pain, Adult Many things can cause abdominal pain. Usually, abdominal pain is not caused by a disease and will improve without treatment. It can often be observed and treated at home. Your health care provider will do a physical exam and possibly order blood tests and X-rays to help determine the seriousness of your pain. However, in many cases, more time must pass before a clear cause of the pain can be found. Before that point, your health care provider may not know if you need more testing or further treatment. HOME CARE INSTRUCTIONS  Monitor your abdominal pain for any changes. The following actions may help to alleviate any discomfort you are experiencing:  Only take over-the-counter or prescription medicines as directed by your health care provider.  Do not take laxatives unless directed to do so by your health care provider.  Try a clear liquid diet (broth, tea, or water) as directed by your health care provider. Slowly move to a bland diet as tolerated. SEEK MEDICAL CARE IF:  You have unexplained abdominal pain.  You have abdominal pain associated with nausea or diarrhea.  You have pain when you urinate or have a bowel movement.  You experience abdominal pain that wakes you in the night.  You have abdominal pain that is worsened or improved by eating food.  You have abdominal pain that is worsened with eating fatty foods. SEEK IMMEDIATE MEDICAL CARE IF:   Your pain does not go away within 2 hours.  You have a fever.  You keep throwing up (vomiting).  Your pain is felt only in portions of the abdomen, such as the right side or the left lower portion of the abdomen.  You pass bloody or black tarry stools. MAKE SURE YOU:  Understand these  instructions.   Will watch your condition.   Will get help right away if you are not doing well or get worse.  Document Released: 10/18/2004 Document Revised: 10/29/2012 Document Reviewed: 09/17/2012 [facility] Patient Information 2014 [place].

## 2013-04-27 NOTE — ED Provider Notes (Signed)
CSN: [id number]     Arrival date & time 04/27/13  [id number] History   First MD Initiated Contact with Patient 04/27/13 [phone]     Chief Complaint  Patient presents with  . Back Pain   HPI  [name] is a 63 y.o. male with a PMH of prostate cancer, type II DM, HTN, HD, GERD, HLD, and depression who presents to the ED for evaluation of back pain. History was provided by the patient. Patient states he has had constant lower abdominal and lower back pain (R = L) since Friday (4 days). His pain is constant and is described as an "annoying" aching pain. Sitting up makes his pain worse. Nothing improves his pain. Did not take anything for pain PTA. Had similar pain in the past a few years ago and went to his doctor with no cause. Has not had similar pain for a few years. Previous surgeries include radial prostatectomy. No emesis, nausea, diarrhea, constipation, dysuria, hematuria, change in appetite/activity, fevers, chills. Also states that he has been having intermittent episodes of chest pain on the left side throughout the last few days. Pain is located on the left side of his chest and is a sharp pain which lasts a few seconds. No chest pain today. Last episode about 5:00 PM yesterday afternoon. No SOB, dyspnea, or cough.    Past Medical History  Diagnosis Date  . 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 Hyperlipidemia   . GERD (gastroesophageal reflux disease) 01/13/2011  . Depression   . Diabetes mellitus type 2, noninsulin dependent 01/13/2011  . Hypertensive heart disease without CHF   . High blood pressure    Past Surgical History  Procedure Laterality Date  . Prostatectomy    . Cardiac catheterization  2005    No CAD   Family History  Problem Relation Age of Onset  . Colon cancer Father   . Cancer Mother   . Stroke Brother    History  Substance Use Topics  . Smoking status: Never Smoker   . Smokeless tobacco: Never Used  . Alcohol Use: No     Review of Systems  Constitutional: Negative for fever, chills, diaphoresis, activity change, appetite change and fatigue.  Respiratory: Negative for cough, chest tightness, shortness of breath and wheezing.   Cardiovascular: Positive for chest pain. Negative for leg swelling.  Gastrointestinal: Positive for abdominal pain. Negative for nausea, vomiting, diarrhea, constipation and blood in stool.  Genitourinary: Negative for dysuria.  Musculoskeletal: Positive for back pain. Negative for gait problem, myalgias and neck pain.  Skin: Negative for wound.  Neurological: Negative for dizziness, weakness, light-headedness, numbness and headaches.    Allergies  Review of patient's allergies indicates no known allergies.  Home Medications   Current Outpatient Rx  Name  Route  Sig  Dispense  Refill  . acetaminophen (TYLENOL) 325 MG tablet   Oral   Take 650 mg by mouth every 6 (six) hours as needed.         [name] aspirin 81 MG tablet   Oral   Take 81 mg by mouth daily.           . carvedilol (COREG) 6.25 MG tablet   Oral   Take 6.25 mg by mouth 2 (two) times daily with a meal.         . ergocalciferol (VITAMIN D2) 50000 UNITS capsule   Oral   Take 50,000 Units by mouth once a week.         [name]  glimepiride (AMARYL) 2 MG tablet   Oral   Take 2 mg by mouth daily before breakfast.           . LISINOPRIL PO   Oral   Take 10 mg by mouth daily.          . metFORMIN (GLUCOPHAGE) 1000 MG tablet   Oral   Take 1,000 mg by mouth 2 (two) times daily with a meal.           . multivitamin-iron-minerals-folic acid (CENTRUM) chewable tablet   Oral   Chew 1 tablet by mouth daily.         . nortriptyline (PAMELOR) 10 MG capsule      One tablet every night for one week, then 2 tablets every night   60 capsule   12   . omeprazole (PRILOSEC) 20 MG capsule   Oral   Take 20 mg by mouth daily.           . pantoprazole (PROTONIX) 40 MG tablet   Oral   Take 40 mg by mouth  daily.         . pravastatin (PRAVACHOL) 80 MG tablet   Oral   Take 80 mg by mouth daily.          BP 125/78  Pulse 74  Temp(Src) 98.7 F (37.1 C) (Oral)  Resp 16  Ht 5\' 7"  (1.702 m)  Wt 145 lb (65.772 kg)  BMI 22.71 kg/m2  SpO2 100%  Filed Vitals:   04/27/13 0940 04/27/13 1400  BP: 125/78 145/73  Pulse: 74 64  Temp: 98.7 F (37.1 C) 98.3 F (36.8 C)  TempSrc: Oral Oral  Resp: 16 16  Height: 5\' 7"  (1.702 m)   Weight: 145 lb (65.772 kg)   SpO2: 100% 100%    Physical Exam  Nursing note and vitals reviewed. Constitutional: He is oriented to person, place, and time. He appears well-developed and well-nourished. No distress.  HENT:  Head: Normocephalic and atraumatic.  Right Ear: External ear normal.  Left Ear: External ear normal.  Nose: Nose normal.  Mouth/Throat: Oropharynx is clear and moist. No oropharyngeal exudate.  Eyes: Conjunctivae are normal. Right eye exhibits no discharge. Left eye exhibits no discharge.  Neck: Normal range of motion. Neck supple.  Cardiovascular: Normal rate, regular rhythm, normal heart sounds and intact distal pulses.  Exam reveals no gallop and no friction rub.   No murmur heard. Pulmonary/Chest: Effort normal and breath sounds normal. No respiratory distress. He has no wheezes. He has no rales. He exhibits no tenderness.  Abdominal: Soft. Bowel sounds are normal. He exhibits no distension and no mass. There is tenderness. There is no rebound and no guarding.  Diffuse tenderness to palpation in the lower abdomen throughout  Musculoskeletal: Normal range of motion. He exhibits no edema and no tenderness.  No CVA, lumbar, or flank tenderness bilaterally  Neurological: He is alert and oriented to person, place, and time.  Skin: Skin is warm and dry. He is not diaphoretic.    ED Course  Procedures (including critical care time) Labs Review Labs Reviewed  URINALYSIS, ROUTINE W REFLEX MICROSCOPIC - Abnormal; Notable for the  following:    Specific Gravity, Urine 1.031 (*)    Glucose, UA >1000 (*)    Ketones, ur 15 (*)    Protein, ur 30 (*)    All other components within normal limits  URINE MICROSCOPIC-ADD ON - Abnormal; Notable for the following:    Squamous Epithelial / LPF  FEW (*)    Bacteria, UA FEW (*)    All other components within normal limits   Imaging Review No results found.   EKG Interpretation   Date/Time:  Monday April 27 2013 10:54:10 EDT Ventricular Rate:  65 PR Interval:  174 QRS Duration: 82 QT Interval:  372 QTC Calculation: 386 R Axis:   34 Text Interpretation:  Normal sinus rhythm Normal ECG No significant change  since last tracing Confirmed by Canary Brim  MD, MARTHA [phone]) on 04/27/2013  3:13:14 PM      Results for orders placed during the hospital encounter of 04/27/13  URINALYSIS, ROUTINE W REFLEX MICROSCOPIC      Result Value Ref Range   Color, Urine YELLOW  YELLOW   APPearance CLEAR  CLEAR   Specific Gravity, Urine 1.031 (*) 1.005 - 1.030   pH 6.0  5.0 - 8.0   Glucose, UA >1000 (*) NEGATIVE mg/dL   Hgb urine dipstick NEGATIVE  NEGATIVE   Bilirubin Urine NEGATIVE  NEGATIVE   Ketones, ur 15 (*) NEGATIVE mg/dL   Protein, ur 30 (*) NEGATIVE mg/dL   Urobilinogen, UA 1.0  0.0 - 1.0 mg/dL   Nitrite NEGATIVE  NEGATIVE   Leukocytes, UA NEGATIVE  NEGATIVE  URINE MICROSCOPIC-ADD ON      Result Value Ref Range   Squamous Epithelial / LPF FEW (*) RARE   WBC, UA 0-2  <3 WBC/hpf   RBC / HPF 0-2  <3 RBC/hpf   Bacteria, UA FEW (*) RARE   Urine-Other MUCOUS PRESENT    CBC WITH DIFFERENTIAL      Result Value Ref Range   WBC 5.0  4.0 - 10.5 K/uL   RBC 4.99  4.22 - 5.81 MIL/uL   Hemoglobin 15.8  13.0 - 17.0 g/dL   HCT 45.0  39.0 - 52.0 %   MCV 90.2  78.0 - 100.0 fL   MCH 31.7  26.0 - 34.0 pg   MCHC 35.1  30.0 - 36.0 g/dL   RDW 12.4  11.5 - 15.5 %   Platelets 220  150 - 400 K/uL   Neutrophils Relative % 56  43 - 77 %   Neutro Abs 2.8  1.7 - 7.7 K/uL   Lymphocytes Relative  30  12 - 46 %   Lymphs Abs 1.5  0.7 - 4.0 K/uL   Monocytes Relative 12  3 - 12 %   Monocytes Absolute 0.6  0.1 - 1.0 K/uL   Eosinophils Relative 1  0 - 5 %   Eosinophils Absolute 0.1  0.0 - 0.7 K/uL   Basophils Relative 0  0 - 1 %   Basophils Absolute 0.0  0.0 - 0.1 K/uL  LIPASE, BLOOD      Result Value Ref Range   Lipase 23  11 - 59 U/L  COMPREHENSIVE METABOLIC PANEL      Result Value Ref Range   Sodium 138  137 - 147 mEq/L   Potassium 4.4  3.7 - 5.3 mEq/L   Chloride 102  96 - 112 mEq/L   CO2 25  19 - 32 mEq/L   Glucose, Bld 132 (*) 70 - 99 mg/dL   BUN 13  6 - 23 mg/dL   Creatinine, Ser 0.70  0.50 - 1.35 mg/dL   Calcium 8.9  8.4 - 10.5 mg/dL   Total Protein 6.9  6.0 - 8.3 g/dL   Albumin 3.9  3.5 - 5.2 g/dL   AST 20  0 - 37 U/L   ALT  41  0 - 53 U/L   Alkaline Phosphatase 77  39 - 117 U/L   Total Bilirubin 0.7  0.3 - 1.2 mg/dL   GFR calc non Af Amer >90  >90 mL/min   GFR calc Af Amer >90  >90 mL/min  TROPONIN I      Result Value Ref Range   Troponin I <0.30  <0.30 ng/mL    MDM   [name] is a 63 y.o. male with a PMH of prostate cancer, type II DM, HTN, HD, GERD, HLD, and depression who presents to the ED for evaluation of back pain.   Rechecks  1:45 PM = Pain resolved.    Etiology of abdominal and back pain unclear. CT negative for an acute intracranial process. Incidental findings included fatty liver, diverticulosis, and stable bilateral inguinal hernias. Patient informed of results. Abdominal exam resolved throughout ED visit. Abdominal exam benign. Labs unremarkable. UA negative. Sent for culture. Encouraged to follow-up with PCP. May need GI referral. Return precautions, discharge instructions, and follow-up was discussed with the patient before discharge.     Discharge Medication List as of 04/27/2013  1:46 PM      Final impressions: 1. Back pain   2. Abdominal pain      Mercy Moore PA-C   This patient was discussed with Dr. Nils Pyle, PA-C 04/27/13 2224

## 2013-04-28 LAB — URINE CULTURE
CULTURE: NO GROWTH
Colony Count: NO GROWTH

## 2013-05-01 NOTE — ED Provider Notes (Signed)
Medical screening examination/treatment/procedure(s) were performed by non-physician practitioner and as supervising physician I was immediately available for consultation/collaboration.   EKG Interpretation   Date/Time:  Monday April 27 2013 10:54:10 EDT Ventricular Rate:  65 PR Interval:  174 QRS Duration: 82 QT Interval:  372 QTC Calculation: 386 R Axis:   34 Text Interpretation:  Normal sinus rhythm Normal ECG No significant change  since last tracing Confirmed by Canary Brim  MD, Laiden Milles [phone]) on 04/27/2013  3:13:14 PM       Threasa Beards, MD 05/01/13 1537

## 2013-06-29 ENCOUNTER — Encounter (HOSPITAL_COMMUNITY): Payer: Self-pay | Admitting: Emergency Medicine

## 2013-06-29 ENCOUNTER — Emergency Department (HOSPITAL_COMMUNITY)
Admission: EM | Admit: 2013-06-29 | Discharge: 2013-06-29 | Disposition: A | Discharge status: Home / Self Care | Payer: Medicare Other | Attending: Emergency Medicine | Admitting: Emergency Medicine

## 2013-06-29 ENCOUNTER — Emergency Department (HOSPITAL_COMMUNITY): Payer: Medicare Other

## 2013-06-29 DIAGNOSIS — M545 Low back pain, unspecified: Secondary | ICD-10-CM | POA: Insufficient documentation

## 2013-06-29 DIAGNOSIS — Z7982 Long term (current) use of aspirin: Secondary | ICD-10-CM | POA: Insufficient documentation

## 2013-06-29 DIAGNOSIS — Z9889 Other specified postprocedural states: Secondary | ICD-10-CM | POA: Insufficient documentation

## 2013-06-29 DIAGNOSIS — F3289 Other specified depressive episodes: Secondary | ICD-10-CM | POA: Insufficient documentation

## 2013-06-29 DIAGNOSIS — R1084 Generalized abdominal pain: Secondary | ICD-10-CM | POA: Insufficient documentation

## 2013-06-29 DIAGNOSIS — R109 Unspecified abdominal pain: Secondary | ICD-10-CM

## 2013-06-29 DIAGNOSIS — Z79899 Other long term (current) drug therapy: Secondary | ICD-10-CM | POA: Insufficient documentation

## 2013-06-29 DIAGNOSIS — E119 Type 2 diabetes mellitus without complications: Secondary | ICD-10-CM | POA: Insufficient documentation

## 2013-06-29 DIAGNOSIS — Z8546 Personal history of malignant neoplasm of prostate: Secondary | ICD-10-CM | POA: Insufficient documentation

## 2013-06-29 DIAGNOSIS — E785 Hyperlipidemia, unspecified: Secondary | ICD-10-CM | POA: Insufficient documentation

## 2013-06-29 DIAGNOSIS — K219 Gastro-esophageal reflux disease without esophagitis: Secondary | ICD-10-CM | POA: Insufficient documentation

## 2013-06-29 DIAGNOSIS — R197 Diarrhea, unspecified: Secondary | ICD-10-CM | POA: Insufficient documentation

## 2013-06-29 DIAGNOSIS — I119 Hypertensive heart disease without heart failure: Secondary | ICD-10-CM | POA: Insufficient documentation

## 2013-06-29 DIAGNOSIS — F329 Major depressive disorder, single episode, unspecified: Secondary | ICD-10-CM | POA: Insufficient documentation

## 2013-06-29 DIAGNOSIS — M549 Dorsalgia, unspecified: Secondary | ICD-10-CM

## 2013-06-29 LAB — HEPATIC FUNCTION PANEL
ALBUMIN: 4.3 g/dL (ref 3.5–5.2)
ALT: 60 U/L — ABNORMAL HIGH (ref 0–53)
AST: 29 U/L (ref 0–37)
Alkaline Phosphatase: 89 U/L (ref 39–117)
Total Bilirubin: 0.6 mg/dL (ref 0.3–1.2)
Total Protein: 7.6 g/dL (ref 6.0–8.3)

## 2013-06-29 LAB — CBC WITH DIFFERENTIAL/PLATELET
Basophils Absolute: 0 10*3/uL (ref 0.0–0.1)
Basophils Relative: 0 % (ref 0–1)
EOS ABS: 0.1 10*3/uL (ref 0.0–0.7)
EOS PCT: 1 % (ref 0–5)
HCT: 46 % (ref 39.0–52.0)
Hemoglobin: 16.1 g/dL (ref 13.0–17.0)
LYMPHS ABS: 2.5 10*3/uL (ref 0.7–4.0)
Lymphocytes Relative: 38 % (ref 12–46)
MCH: 31.9 pg (ref 26.0–34.0)
MCHC: 35 g/dL (ref 30.0–36.0)
MCV: 91.1 fL (ref 78.0–100.0)
MONO ABS: 0.6 10*3/uL (ref 0.1–1.0)
Monocytes Relative: 9 % (ref 3–12)
Neutro Abs: 3.4 10*3/uL (ref 1.7–7.7)
Neutrophils Relative %: 52 % (ref 43–77)
PLATELETS: 221 10*3/uL (ref 150–400)
RBC: 5.05 MIL/uL (ref 4.22–5.81)
RDW: 12.8 % (ref 11.5–15.5)
WBC: 6.6 10*3/uL (ref 4.0–10.5)

## 2013-06-29 LAB — URINE MICROSCOPIC-ADD ON

## 2013-06-29 LAB — LIPASE, BLOOD: Lipase: 26 U/L (ref 11–59)

## 2013-06-29 LAB — BASIC METABOLIC PANEL
BUN: 15 mg/dL (ref 6–23)
CALCIUM: 9.2 mg/dL (ref 8.4–10.5)
CO2: 27 mEq/L (ref 19–32)
Chloride: 97 mEq/L (ref 96–112)
Creatinine, Ser: 0.78 mg/dL (ref 0.50–1.35)
GFR calc Af Amer: >90 mL/min (ref >90)
Glucose, Bld: 150 mg/dL — ABNORMAL HIGH (ref 70–99)
Potassium: 4.2 mEq/L (ref 3.7–5.3)
SODIUM: 135 meq/L — AB (ref 137–147)

## 2013-06-29 LAB — URINALYSIS, ROUTINE W REFLEX MICROSCOPIC
Bilirubin Urine: NEGATIVE
Glucose, UA: >1000 mg/dL — AB
Hgb urine dipstick: NEGATIVE
KETONES UR: NEGATIVE mg/dL
LEUKOCYTES UA: NEGATIVE
NITRITE: NEGATIVE
PH: 6 (ref 5.0–8.0)
Protein, ur: NEGATIVE mg/dL
Specific Gravity, Urine: 1.037 — ABNORMAL HIGH (ref 1.005–1.030)
UROBILINOGEN UA: 1 mg/dL (ref 0.0–1.0)

## 2013-06-29 MED ORDER — IOHEXOL 300 MG/ML  SOLN
80.0000 mL | Freq: Once | INTRAMUSCULAR | Status: AC | PRN
  Administered 2013-06-29: 80 mL via INTRAVENOUS

## 2013-06-29 MED ORDER — SODIUM CHLORIDE 0.9 % IV SOLN: INTRAVENOUS | Status: DC

## 2013-06-29 MED ORDER — SODIUM CHLORIDE 0.9 % IV BOLUS (SEPSIS)
500.0000 mL | Freq: Once | INTRAVENOUS | Status: AC
  Administered 2013-06-29: 500 mL via INTRAVENOUS

## 2013-06-29 MED ORDER — HYDROCODONE-ACETAMINOPHEN 5-325 MG PO TABS
ORAL_TABLET | ORAL | Status: DC
Start: 2013-06-29 — End: 2014-02-02

## 2013-06-29 MED ORDER — IOHEXOL 300 MG/ML  SOLN
50.0000 mL | Freq: Once | INTRAMUSCULAR | Status: AC | PRN
  Administered 2013-06-29: 50 mL via ORAL

## 2013-06-29 MED ORDER — MORPHINE SULFATE 4 MG/ML IJ SOLN
4.0000 mg | Freq: Once | INTRAMUSCULAR | Status: AC
  Administered 2013-06-29: 4 mg via INTRAVENOUS
  Filled 2013-06-29: qty 1

## 2013-06-29 NOTE — ED Provider Notes (Signed)
  Face-to-face evaluation   History: He presents for evaluation of lower back, pain or abdominal pain. Today, he had 3 loose bowel movements, brown in color. He has had colonoscopy one year ago with polyps, but they were normal. He has not had chronic GI issues. He denies fever, chills, cough, shortness of breath, chest pain, weakness, or dizziness. He, states his pain is almost gone after being treated with morphine. He has a history of prostate cancer with prostatectomy, not requiring chemotherapy or radiation; 10 years ago.  Physical exam: Alert, calm, cooperative. Abdomen normal bowel sounds, soft. Very minimal suprapubic tenderness. No rebound tenderness. Back is nontender to palpation.   Medical screening examination/treatment/procedure(s) were conducted as a shared visit with non-physician practitioner(s) and myself.  I personally evaluated the patient during the encounter  Richarda Blade, MD 06/30/13 [phone]

## 2013-06-29 NOTE — Discharge Instructions (Signed)
Take vicodin for breakthrough pain, do not drink alcohol, drive, care for children or do other critical tasks while taking vicodin.  Please follow with your primary care doctor in the next 2 days for a check-up. They must obtain records for further management.   Do not hesitate to return to the Emergency Department for any new, worsening or concerning symptoms.    Abdominal Pain, Adult Many things can cause belly (abdominal) pain. Most times, the belly pain is not dangerous. Many cases of belly pain can be watched and treated at home. HOME CARE   Do not take medicines that help you go poop (laxatives) unless told to by your doctor.  Only take medicine as told by your doctor.  Eat or drink as told by your doctor. Your doctor will tell you if you should be on a special diet. GET HELP IF:  You do not know what is causing your belly pain.  You have belly pain while you are sick to your stomach (nauseous) or have runny poop (diarrhea).  You have pain while you pee or poop.  Your belly pain wakes you up at night.  You have belly pain that gets worse or better when you eat.  You have belly pain that gets worse when you eat fatty foods. GET HELP RIGHT AWAY IF:   The pain does not go away within 2 hours.  You have a fever.  You keep throwing up (vomiting).  The pain changes and is only in the right or left part of the belly.  You have bloody or tarry looking poop. MAKE SURE YOU:   Understand these instructions.  Will watch your condition.  Will get help right away if you are not doing well or get worse. Document Released: 06/27/2007 Document Revised: 10/29/2012 Document Reviewed: 09/17/2012 [facility] Patient Information 2014 [place].

## 2013-06-29 NOTE — ED Provider Notes (Signed)
CSN: [id number]     Arrival date & time 06/29/13  1243 History   First MD Initiated Contact with Patient 06/29/13 1305     Chief Complaint  Patient presents with  . Abdominal Pain  . Diarrhea  . Back Pain     (Consider location/radiation/quality/duration/timing/severity/associated sxs/prior Treatment) HPI  [name] is a 63 y.o. male with past medical history significant for non-insulin-dependent diabetes, prostate cancer status post radical prostatectomy, normal PSA last month complaining of worsening bilateral lower abdominal pain and back pain. Pain is 5/10, no pain medication prior to arrival. No exacerbating or alleviating factors identified. Patient diarrhea onset 3 days ago, his constant bathroom 3 times the last 6 hours. Denies known or hematochezia. Patient has had [place] one month ago, no acute findings and asked to follow with primary care. Patient had colonoscopy last year, no significant abnormalities a few polyps which were removed and found to be benign. Patient denies chest pain, shortness of breath, nausea vomiting, change in urination. States that he has a mild urinary incontinence at baseline after the prostatectomy.   Past Medical History  Diagnosis Date  . 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 Hyperlipidemia   . GERD (gastroesophageal reflux disease) 01/13/2011  . Depression   . Diabetes mellitus type 2, noninsulin dependent 01/13/2011  . Hypertensive heart disease without CHF   . High blood pressure    Past Surgical History  Procedure Laterality Date  . Prostatectomy    . Cardiac catheterization  2005    No CAD   Family History  Problem Relation Age of Onset  . Colon cancer Father   . Cancer Mother   . Stroke Brother    History  Substance Use Topics  . Smoking status: Never Smoker   . Smokeless tobacco: Never Used  . Alcohol Use: No    Review of Systems  10 systems reviewed and found to be negative, except as  noted in the HPI.  Allergies  Iohexol  Home Medications   Prior to Admission medications   Medication Sig Start Date End Date Taking? Authorizing Provider  aspirin 81 MG tablet Take 81 mg by mouth daily.     Yes Historical Provider, MD  carvedilol (COREG) 6.25 MG tablet Take 6.25 mg by mouth 2 (two) times daily with a meal.   Yes Historical Provider, MD  glimepiride (AMARYL) 2 MG tablet Take 4 mg by mouth daily before breakfast.    Yes Historical Provider, MD  LISINOPRIL PO Take 10 mg by mouth daily.    Yes Historical Provider, MD  metFORMIN (GLUCOPHAGE) 1000 MG tablet Take 1,000 mg by mouth 2 (two) times daily with a meal.     Yes Historical Provider, MD  multivitamin-iron-minerals-folic acid (CENTRUM) chewable tablet Chew 1 tablet by mouth daily.   Yes Historical Provider, MD  nortriptyline (PAMELOR) 10 MG capsule One tablet every night for one week, then 2 tablets every night 03/26/13  Yes Marcial Pacas, MD  omeprazole (PRILOSEC) 20 MG capsule Take 20 mg by mouth daily.     Yes Historical Provider, MD  pantoprazole (PROTONIX) 40 MG tablet Take 40 mg by mouth daily.   Yes Historical Provider, MD  pravastatin (PRAVACHOL) 80 MG tablet Take 80 mg by mouth daily.   Yes Historical Provider, MD  HYDROcodone-acetaminophen (NORCO/VICODIN) 5-325 MG per tablet Take 1-2 tablets by mouth every 6 hours as needed for pain. 06/29/13   Rubens Cranston, PA-C   BP  146/82  Pulse 60  Temp(Src) 98 F (36.7 C) (Oral)  Resp 16  SpO2 98% Physical Exam  Nursing note and vitals reviewed. Constitutional: He is oriented to person, place, and time. He appears well-developed and well-nourished. No distress.  HENT:  Head: Normocephalic.  Mouth/Throat: Oropharynx is clear and moist.  Eyes: Conjunctivae and EOM are normal. Pupils are equal, round, and reactive to light.  Neck: Normal range of motion.  Cardiovascular: Normal rate, regular rhythm and intact distal pulses.   Pulmonary/Chest: Effort normal and breath  sounds normal. No stridor. No respiratory distress. He has no wheezes. He has no rales. He exhibits no tenderness.  Abdominal: Soft. Bowel sounds are normal. He exhibits no distension and no mass. There is tenderness. There is no rebound and no guarding.  Mild diffuse tenderness to palpation especially along the bilateral lower quadrants. No guarding or rebound.  Musculoskeletal: Normal range of motion.  Neurological: He is alert and oriented to person, place, and time.  Skin: Skin is warm.  Psychiatric: He has a normal mood and affect.    ED Course  Procedures (including critical care time) Labs Review Labs Reviewed  BASIC METABOLIC PANEL - Abnormal; Notable for the following:    Sodium 135 (*)    Glucose, Bld 150 (*)    All other components within normal limits  HEPATIC FUNCTION PANEL - Abnormal; Notable for the following:    ALT 60 (*)    All other components within normal limits  URINALYSIS, ROUTINE W REFLEX MICROSCOPIC - Abnormal; Notable for the following:    Specific Gravity, Urine 1.037 (*)    Glucose, UA >1000 (*)    All other components within normal limits  CBC WITH DIFFERENTIAL  LIPASE, BLOOD  URINE MICROSCOPIC-ADD ON    Imaging Review Ct Abdomen Pelvis W Contrast  06/29/2013   CLINICAL DATA:  Lower abdominal pain for 1 month. History of prostatectomy for prostate cancer.  EXAM: CT ABDOMEN AND PELVIS WITH CONTRAST  TECHNIQUE: Multidetector CT imaging of the abdomen and pelvis was performed using the standard protocol following bolus administration of intravenous contrast.  CONTRAST:  23mL OMNIPAQUE IOHEXOL 300 MG/ML SOLN, 59mL OMNIPAQUE IOHEXOL 300 MG/ML SOLN  COMPARISON:  CT abdomen pelvis with contrast 04/27/2013  FINDINGS: Lung bases: The imaged lung bases are clear. Heart size is within normal limits.  The liver is normal in size. There is prominent, diffuse fatty infiltration throughout the liver. Negative for hepatic mass or intrahepatic biliary ductal dilatation.  The  spleen is normal in size. A few small calcifications in the spleen suggest prior granulomatous disease. The spleen enhances normally. The adrenal glands, pancreas, and kidneys are within normal limits.  The stomach is well distended with oral contrast and has a normal appearance. The proximal to mid small bowel loops are opacified with oral contrast and are normal in caliber and wall thickness. Distal small bowel loops are not opacified with contrast at the time of imaging, but are normal in caliber and wall thickness. The terminal ileum has a normal appearance.  The appendix is normal.  Diverticulosis affects the colon from the level of the hepatic flexure through the distal sigmoid colon. Diverticulosis is most prominent in the distal descending colon and sigmoid colon. Colon is normal in caliber. There are no inflammatory changes associated with the diverticula. The mesenteries appear normal. The rectum is normal.  Status post prostatectomy. Normal urinary bladder. Ureters normal in caliber.  Abdominal aorta normal in caliber and is free of atherosclerotic  change. Branch vessels of the aorta are patent, with small focal area of atherosclerotic calcification at the origin of the right renal artery.  Negative for lymphadenopathy or ascites.  No acute or suspicious bony abnormality.  IMPRESSION: 1. Extensive colonic diverticulosis. Negative for associated inflammatory changes. 2. Extensive fatty infiltration the liver. 3. Prostatectomy. 4. Negative for lymphadenopathy.   Electronically Signed   By: Curlene Dolphin M.D.   On: 06/29/2013 15:37     EKG Interpretation None      MDM   Final diagnoses:  Abdominal pain  Back pain  Diarrhea    Filed Vitals:   06/29/13 1255 06/29/13 1542  BP: 137/72 146/82  Pulse: 73 60  Temp: 98 F (36.7 C)   TempSrc: Oral   Resp: 18 16  SpO2: 97% 98%    Medications  0.9 %  sodium chloride infusion (not administered)  morphine 4 MG/ML injection 4 mg (4 mg  Intravenous Given 06/29/13 1345)  iohexol (OMNIPAQUE) 300 MG/ML solution 50 mL (50 mLs Oral Contrast Given 06/29/13 1403)  sodium chloride 0.9 % bolus 500 mL (0 mLs Intravenous Stopped 06/29/13 1618)  iohexol (OMNIPAQUE) 300 MG/ML solution 80 mL (80 mLs Intravenous Contrast Given 06/29/13 1513)    [name] is a 63 y.o. male presenting with lower, pain worsening over the course of the last 4 weeks associated with diarrhea onset 3 days ago. Mild tenderness to palpation along the lower quadrants with no guarding or rebound. Blood work with no acute abnormalities. CT also with no acute abnormalities. Advised patient to follow closely with both his gastroenterologist and primary care.  Evaluation does not show pathology that would require ongoing emergent intervention or inpatient treatment. Pt is hemodynamically stable and mentating appropriately. Discussed findings and plan with patient/guardian, who agrees with care plan. All questions answered. Return precautions discussed and outpatient follow up given.   Discharge Medication List as of 06/29/2013  4:02 PM    START taking these medications   Details  HYDROcodone-acetaminophen (NORCO/VICODIN) 5-325 MG per tablet Take 1-2 tablets by mouth every 6 hours as needed for pain., [employer], PA-C 06/29/13 1624

## 2013-06-29 NOTE — ED Notes (Signed)
Pt c/o lower abd pain and lower back pain. Pt also has loose stools. Pt went to GI doctor and discharged. Pt states since then it has been getting worse. Denies n/v.

## 2013-07-22 ENCOUNTER — Ambulatory Visit: Payer: Medicare Other | Admitting: Nurse Practitioner

## 2013-08-17 ENCOUNTER — Telehealth: Payer: Self-pay

## 2013-08-17 ENCOUNTER — Other Ambulatory Visit: Payer: Self-pay | Admitting: Neurology

## 2013-08-17 ENCOUNTER — Ambulatory Visit: Payer: Medicare Other | Admitting: Nurse Practitioner

## 2013-08-17 ENCOUNTER — Telehealth: Payer: Self-pay | Admitting: Nurse Practitioner

## 2013-08-17 DIAGNOSIS — R51 Headache: Secondary | ICD-10-CM

## 2013-08-17 NOTE — Telephone Encounter (Signed)
No show for scheduled appt 

## 2013-08-17 NOTE — Telephone Encounter (Signed)
Will speak to Dr.Sethi about MRA report.

## 2013-08-17 NOTE — Telephone Encounter (Signed)
Spoke to [place].Leonie Man he is handling MRV.

## 2014-01-30 ENCOUNTER — Encounter (HOSPITAL_BASED_OUTPATIENT_CLINIC_OR_DEPARTMENT_OTHER): Payer: Self-pay | Admitting: Emergency Medicine

## 2014-01-30 ENCOUNTER — Emergency Department (HOSPITAL_BASED_OUTPATIENT_CLINIC_OR_DEPARTMENT_OTHER): Payer: Medicare Other

## 2014-01-30 ENCOUNTER — Observation Stay (HOSPITAL_BASED_OUTPATIENT_CLINIC_OR_DEPARTMENT_OTHER)
Admission: EM | Admit: 2014-01-30 | Discharge: 2014-02-02 | Disposition: A | Discharge status: Home / Self Care | Payer: Medicare Other | Attending: Internal Medicine | Admitting: Internal Medicine

## 2014-01-30 DIAGNOSIS — I10 Essential (primary) hypertension: Secondary | ICD-10-CM | POA: Diagnosis not present

## 2014-01-30 DIAGNOSIS — R079 Chest pain, unspecified: Secondary | ICD-10-CM | POA: Diagnosis present

## 2014-01-30 DIAGNOSIS — Z8546 Personal history of malignant neoplasm of prostate: Secondary | ICD-10-CM

## 2014-01-30 DIAGNOSIS — E785 Hyperlipidemia, unspecified: Secondary | ICD-10-CM | POA: Diagnosis not present

## 2014-01-30 DIAGNOSIS — R531 Weakness: Secondary | ICD-10-CM

## 2014-01-30 DIAGNOSIS — R200 Anesthesia of skin: Secondary | ICD-10-CM

## 2014-01-30 DIAGNOSIS — R634 Abnormal weight loss: Secondary | ICD-10-CM

## 2014-01-30 DIAGNOSIS — I119 Hypertensive heart disease without heart failure: Secondary | ICD-10-CM

## 2014-01-30 DIAGNOSIS — E119 Type 2 diabetes mellitus without complications: Secondary | ICD-10-CM | POA: Diagnosis not present

## 2014-01-30 DIAGNOSIS — F32A Depression, unspecified: Secondary | ICD-10-CM

## 2014-01-30 DIAGNOSIS — R0789 Other chest pain: Secondary | ICD-10-CM

## 2014-01-30 DIAGNOSIS — F329 Major depressive disorder, single episode, unspecified: Secondary | ICD-10-CM

## 2014-01-30 DIAGNOSIS — R202 Paresthesia of skin: Secondary | ICD-10-CM

## 2014-01-30 DIAGNOSIS — K219 Gastro-esophageal reflux disease without esophagitis: Secondary | ICD-10-CM

## 2014-01-30 DIAGNOSIS — G459 Transient cerebral ischemic attack, unspecified: Secondary | ICD-10-CM

## 2014-01-30 DIAGNOSIS — R1030 Lower abdominal pain, unspecified: Secondary | ICD-10-CM

## 2014-01-30 HISTORY — DX: Essential (primary) hypertension: I10

## 2014-01-30 HISTORY — DX: Dyskinesia of esophagus: K22.4

## 2014-01-30 HISTORY — DX: Other specified postprocedural states: Z98.890

## 2014-01-30 LAB — BASIC METABOLIC PANEL
Anion gap: 9 (ref 5–15)
BUN: 12 mg/dL (ref 6–23)
CO2: 24 mmol/L (ref 19–32)
CREATININE: 0.71 mg/dL (ref 0.50–1.35)
Calcium: 9.2 mg/dL (ref 8.4–10.5)
Chloride: 99 mEq/L (ref 96–112)
GFR calc Af Amer: >90 mL/min (ref >90)
GFR calc non Af Amer: >90 mL/min (ref >90)
Glucose, Bld: 103 mg/dL — ABNORMAL HIGH (ref 70–99)
POTASSIUM: 3.9 mmol/L (ref 3.5–5.1)
SODIUM: 132 mmol/L — AB (ref 135–145)

## 2014-01-30 LAB — GLUCOSE, CAPILLARY
GLUCOSE-CAPILLARY: 91 mg/dL (ref 70–99)
GLUCOSE-CAPILLARY: 194 mg/dL — AB (ref 70–99)

## 2014-01-30 LAB — TROPONIN I
Troponin I: <0.03 ng/mL (ref <0.031)
Troponin I: <0.03 ng/mL (ref <0.031)

## 2014-01-30 LAB — CBC
HCT: 43.8 % (ref 39.0–52.0)
Hemoglobin: 15.4 g/dL (ref 13.0–17.0)
MCH: 31.6 pg (ref 26.0–34.0)
MCHC: 35.2 g/dL (ref 30.0–36.0)
MCV: 89.9 fL (ref 78.0–100.0)
Platelets: 210 10*3/uL (ref 150–400)
RBC: 4.87 MIL/uL (ref 4.22–5.81)
RDW: 11.8 % (ref 11.5–15.5)
WBC: 6.4 10*3/uL (ref 4.0–10.5)

## 2014-01-30 MED ORDER — SODIUM CHLORIDE 0.9 % IJ SOLN
3.0000 mL | INTRAMUSCULAR | Status: DC | PRN
  Administered 2014-02-01: 3 mL via INTRAVENOUS
  Filled 2014-01-30: qty 3

## 2014-01-30 MED ORDER — ALUM & MAG HYDROXIDE-SIMETH 200-200-20 MG/5ML PO SUSP
30.0000 mL | Freq: Four times a day (QID) | ORAL | Status: DC | PRN
  Administered 2014-01-30: 30 mL via ORAL
  Filled 2014-01-30: qty 30

## 2014-01-30 MED ORDER — NITROGLYCERIN 2 % TD OINT
0.5000 [in_us] | TOPICAL_OINTMENT | Freq: Four times a day (QID) | TRANSDERMAL | Status: DC
  Administered 2014-01-30 – 2014-02-01 (×7): 0.5 [in_us] via TOPICAL
  Filled 2014-01-30: qty 30

## 2014-01-30 MED ORDER — ENOXAPARIN SODIUM 30 MG/0.3ML ~~LOC~~ SOLN
30.0000 mg | SUBCUTANEOUS | Status: DC
  Administered 2014-01-30: 30 mg via SUBCUTANEOUS
  Filled 2014-01-30: qty 0.3

## 2014-01-30 MED ORDER — HYDROMORPHONE HCL 1 MG/ML IJ SOLN: 0.5000 mg | INTRAMUSCULAR | Status: DC | PRN

## 2014-01-30 MED ORDER — SODIUM CHLORIDE 0.9 % IV SOLN: 250.0000 mL | INTRAVENOUS | Status: DC | PRN

## 2014-01-30 MED ORDER — INSULIN ASPART 100 UNIT/ML ~~LOC~~ SOLN
0.0000 [IU] | Freq: Three times a day (TID) | SUBCUTANEOUS | Status: DC
  Administered 2014-02-01 – 2014-02-02 (×3): 2 [IU] via SUBCUTANEOUS

## 2014-01-30 MED ORDER — ACETAMINOPHEN 325 MG PO TABS
650.0000 mg | ORAL_TABLET | Freq: Four times a day (QID) | ORAL | Status: DC | PRN
  Administered 2014-02-01: 650 mg via ORAL
  Filled 2014-01-30: qty 2

## 2014-01-30 MED ORDER — ASPIRIN EC 325 MG PO TBEC
325.0000 mg | DELAYED_RELEASE_TABLET | Freq: Every day | ORAL | Status: DC
  Administered 2014-01-31 – 2014-02-02 (×3): 325 mg via ORAL
  Filled 2014-01-30 (×3): qty 1

## 2014-01-30 MED ORDER — CARVEDILOL 6.25 MG PO TABS
6.2500 mg | ORAL_TABLET | Freq: Two times a day (BID) | ORAL | Status: DC
  Administered 2014-01-31: 6.25 mg via ORAL
  Filled 2014-01-30: qty 1

## 2014-01-30 MED ORDER — ONDANSETRON HCL 4 MG/2ML IJ SOLN: 4.0000 mg | Freq: Four times a day (QID) | INTRAMUSCULAR | Status: DC | PRN

## 2014-01-30 MED ORDER — PANTOPRAZOLE SODIUM 20 MG PO TBEC
20.0000 mg | DELAYED_RELEASE_TABLET | Freq: Every day | ORAL | Status: DC
  Administered 2014-01-30: 20 mg via ORAL
  Filled 2014-01-30 (×2): qty 1

## 2014-01-30 MED ORDER — SODIUM CHLORIDE 0.9 % IJ SOLN
3.0000 mL | Freq: Two times a day (BID) | INTRAMUSCULAR | Status: DC
  Administered 2014-02-02: 3 mL via INTRAVENOUS

## 2014-01-30 MED ORDER — ONDANSETRON HCL 4 MG PO TABS: 4.0000 mg | ORAL_TABLET | Freq: Four times a day (QID) | ORAL | Status: DC | PRN

## 2014-01-30 MED ORDER — LISINOPRIL 10 MG PO TABS
10.0000 mg | ORAL_TABLET | Freq: Two times a day (BID) | ORAL | Status: DC
  Administered 2014-01-30 – 2014-02-01 (×4): 10 mg via ORAL
  Filled 2014-01-30 (×3): qty 1
  Filled 2014-01-30: qty 2

## 2014-01-30 MED ORDER — NITROGLYCERIN 2 % TD OINT
1.0000 [in_us] | TOPICAL_OINTMENT | Freq: Four times a day (QID) | TRANSDERMAL | Status: DC
  Administered 2014-01-30: 1 [in_us] via TOPICAL
  Filled 2014-01-30: qty 1

## 2014-01-30 MED ORDER — PRAVASTATIN SODIUM 40 MG PO TABS
40.0000 mg | ORAL_TABLET | Freq: Every day | ORAL | Status: DC
  Administered 2014-01-30 – 2014-02-02 (×4): 40 mg via ORAL
  Filled 2014-01-30 (×4): qty 1

## 2014-01-30 MED ORDER — SODIUM CHLORIDE 0.9 % IJ SOLN
3.0000 mL | Freq: Two times a day (BID) | INTRAMUSCULAR | Status: DC
  Administered 2014-01-31 – 2014-02-02 (×2): 3 mL via INTRAVENOUS

## 2014-01-30 MED ORDER — SERTRALINE HCL 50 MG PO TABS
50.0000 mg | ORAL_TABLET | Freq: Every day | ORAL | Status: DC
  Administered 2014-01-30 – 2014-02-02 (×4): 50 mg via ORAL
  Filled 2014-01-30 (×4): qty 1

## 2014-01-30 MED ORDER — NITROGLYCERIN 0.4 MG SL SUBL
0.4000 mg | SUBLINGUAL_TABLET | SUBLINGUAL | Status: DC | PRN
  Administered 2014-01-30: 0.4 mg via SUBLINGUAL
  Filled 2014-01-30: qty 1

## 2014-01-30 MED ORDER — ASPIRIN 81 MG PO CHEW
324.0000 mg | CHEWABLE_TABLET | Freq: Once | ORAL | Status: AC
  Administered 2014-01-30: 324 mg via ORAL
  Filled 2014-01-30: qty 4

## 2014-01-30 MED ORDER — OXYCODONE HCL 5 MG PO TABS
5.0000 mg | ORAL_TABLET | ORAL | Status: DC | PRN
  Administered 2014-01-31: 5 mg via ORAL
  Filled 2014-01-30: qty 1

## 2014-01-30 MED ORDER — INSULIN ASPART 100 UNIT/ML ~~LOC~~ SOLN
0.0000 [IU] | Freq: Every day | SUBCUTANEOUS | Status: DC
  Administered 2014-02-01: 2 [IU] via SUBCUTANEOUS

## 2014-01-30 MED ORDER — ACETAMINOPHEN 650 MG RE SUPP: 650.0000 mg | Freq: Four times a day (QID) | RECTAL | Status: DC | PRN

## 2014-01-30 NOTE — ED Provider Notes (Signed)
CSN: [id number]     Arrival date & time 01/30/14  1244 History   First MD Initiated Contact with Patient 01/30/14 1338     Chief Complaint  Patient presents with  . Chest Pain     (Consider location/radiation/quality/duration/timing/severity/associated sxs/prior Treatment) HPI Comments: Patient presents with chest pain. He states that during the night he was complaining of some intermittent discomfort to his epigastrium that about 6 AM he started having more persistent pain in the center of his chest and epigastrium that was radiating to his left arm. It's been constant since that time. He denies any shortness of breath. There is no nausea vomiting or diaphoresis. He has a history of hypertension hyperlipidemia and diabetes. He denies any history of heart disease. He has had a stress test but it's been about 4-5 years ago. He states the pain occurred at rest and is not related to exertion. He does have a history of GERD but says this doesn't feel like his typical reflux symptoms.  Patient is a 64 y.o. male presenting with chest pain.  Chest Pain Associated symptoms: no abdominal pain, no back pain, no cough, no diaphoresis, no dizziness, no fatigue, no fever, no headache, no nausea, no numbness, no shortness of breath, not vomiting and no weakness     Past Medical History  Diagnosis Date  . 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 Hyperlipidemia   . GERD (gastroesophageal reflux disease) 01/13/2011  . Depression   . Diabetes mellitus type 2, noninsulin dependent 01/13/2011  . Hypertensive heart disease without CHF   . High blood pressure    Past Surgical History  Procedure Laterality Date  . Prostatectomy    . Cardiac catheterization  2005    No CAD   Family History  Problem Relation Age of Onset  . Colon cancer Father   . Cancer Mother   . Stroke Brother    History  Substance Use Topics  . Smoking status: Never Smoker   . Smokeless tobacco:  Never Used  . Alcohol Use: No    Review of Systems  Constitutional: Negative for fever, chills, diaphoresis and fatigue.  HENT: Negative for congestion, rhinorrhea and sneezing.   Eyes: Negative.   Respiratory: Negative for cough, chest tightness and shortness of breath.   Cardiovascular: Positive for chest pain. Negative for leg swelling.  Gastrointestinal: Negative for nausea, vomiting, abdominal pain, diarrhea and blood in stool.  Genitourinary: Negative for frequency, hematuria, flank pain and difficulty urinating.  Musculoskeletal: Negative for back pain and arthralgias.  Skin: Negative for rash.  Neurological: Negative for dizziness, speech difficulty, weakness, numbness and headaches.      Allergies  Iohexol  Home Medications   Prior to Admission medications   Medication Sig Start Date End Date Taking? Authorizing Provider  aspirin 81 MG tablet Take 81 mg by mouth daily.      Historical Provider, MD  carvedilol (COREG) 6.25 MG tablet Take 6.25 mg by mouth 2 (two) times daily with a meal.    Historical Provider, MD  glimepiride (AMARYL) 2 MG tablet Take 4 mg by mouth daily before breakfast.     Historical Provider, MD  HYDROcodone-acetaminophen (NORCO/VICODIN) 5-325 MG per tablet Take 1-2 tablets by mouth every 6 hours as needed for pain. 06/29/13   Nicole Pisciotta, PA-C  LISINOPRIL PO Take 10 mg by mouth daily.     Historical Provider, MD  metFORMIN (GLUCOPHAGE) 1000 MG tablet Take 1,000 mg by mouth  2 (two) times daily with a meal.      Historical Provider, MD  multivitamin-iron-minerals-folic acid (CENTRUM) chewable tablet Chew 1 tablet by mouth daily.    Historical Provider, MD  nortriptyline (PAMELOR) 10 MG capsule One tablet every night for one week, then 2 tablets every night 03/26/13   Marcial Pacas, MD  omeprazole (PRILOSEC) 20 MG capsule Take 20 mg by mouth daily.      Historical Provider, MD  pantoprazole (PROTONIX) 40 MG tablet Take 40 mg by mouth daily.    Historical  Provider, MD  pravastatin (PRAVACHOL) 80 MG tablet Take 80 mg by mouth daily.    Historical Provider, MD   BP 113/72 mmHg  Pulse 59  Temp(Src) 97.9 F (36.6 C) (Oral)  Resp 14  Ht 5\' 8"  (1.727 m)  Wt 140 lb (63.504 kg)  BMI 21.29 kg/m2  SpO2 99% Physical Exam  Constitutional: He is oriented to person, place, and time. He appears well-developed and well-nourished.  HENT:  Head: Normocephalic and atraumatic.  Mouth/Throat: Oropharynx is clear and moist.  Eyes: Pupils are equal, round, and reactive to light.  Neck: Normal range of motion. Neck supple.  Cardiovascular: Normal rate, regular rhythm and normal heart sounds.   Pulmonary/Chest: Effort normal and breath sounds normal. No respiratory distress. He has no wheezes. He has no rales. He exhibits no tenderness.  Abdominal: Soft. Bowel sounds are normal. There is no tenderness. There is no rebound and no guarding.  Musculoskeletal: Normal range of motion. He exhibits no edema.  Lymphadenopathy:    He has no cervical adenopathy.  Neurological: He is alert and oriented to person, place, and time.  Skin: Skin is warm and dry. No rash noted.  Psychiatric: He has a normal mood and affect.    ED Course  Procedures (including critical care time) Labs Review Labs Reviewed  BASIC METABOLIC PANEL - Abnormal; Notable for the following:    Sodium 132 (*)    Glucose, Bld 103 (*)    All other components within normal limits  CBC  TROPONIN I    Imaging Review Dg Chest 2 View  01/30/2014   CLINICAL DATA:  One day history of chest pain.  Left arm tingling  EXAM: CHEST  2 VIEW  COMPARISON:  July 21, 2012  FINDINGS: Lungs are clear. Heart size and pulmonary vascularity are normal. No adenopathy. No pneumothorax. No bone lesions.  IMPRESSION: No edema or consolidation.   Electronically Signed   By: Lowella Grip M.D.   On: 01/30/2014 14:29     EKG Interpretation   Date/Time:  Saturday January 30 2014 12:53:03 EST Ventricular Rate:   73 PR Interval:  160 QRS Duration: 86 QT Interval:  358 QTC Calculation: 394 R Axis:   72 Text Interpretation:  Sinus rhythm with Premature atrial complexes  Otherwise normal ECG since last tracing no significant change Confirmed by  Bluford Sedler  MD, [place] ([id number]) on 01/30/2014 1:43:53 PM      MDM   Final diagnoses:  Chest pain, unspecified chest pain type   Patient presents with chest pain. His EKG does not show ischemia. His troponin is negative. However he has a heart score 4 pain in the moderate risk category. He's pain free after nitroglycerin. He was given aspirin as well. I spoke with Dr. Carles Collet who has accepted patient for transfer to [facility] for further cardiac evaluation.     Malvin Johns, MD 01/30/14 [phone]

## 2014-01-30 NOTE — ED Notes (Signed)
Pt presents to ED with complaints of chest pain and left shoulder pain that started around 5-6 am this morning.

## 2014-01-30 NOTE — ED Notes (Signed)
MD back at bedside for reassessment.

## 2014-01-30 NOTE — H&P (Signed)
[facility] Admission History and Physical       KYLAND NO EQA:[id number] DOB: 26-Apr-1950 DOA: 01/30/2014  Referring physician:  PCP: Cari Caraway, MD  Specialists:   Chief Complaint: Chest Pain  HPI: [name] is a 64 y.o. male with a history of DM2, HTN, and Hyperlipidemia who presented to the [facility] ED with complaints of Nagging, constant chest pain since the AM.  He rated the pain at a 2/10 located in the mid and left chest area.  He denies any SOB, nausea or vomiting or diaphoresis.   His initial workup has been negative and he was referred for further evaluation .   He reports having a negative Stress Test 3 years ago.       Review of Systems:  Constitutional: No Weight Loss, No Weight Gain, Night Sweats, Fevers, Chills, Dizziness, Fatigue, or Generalized Weakness HEENT: No Headaches, Difficulty Swallowing,Tooth/Dental Problems,Sore Throat,  No Sneezing, Rhinitis, Ear Ache, Nasal Congestion, or Post Nasal Drip,  Cardio-vascular:  +Chest pain, Orthopnea, PND, Edema in Lower Extremities, Anasarca, Dizziness, Palpitations  Resp: No Dyspnea, No DOE, No Productive Cough, No Non-Productive Cough, No Hemoptysis, No Wheezing.    GI: No Heartburn, Indigestion, Abdominal Pain, Nausea, Vomiting, Diarrhea, Hematemesis, Hematochezia, Melena, Change in Bowel Habits,  Loss of Appetite  GU: No Dysuria, Change in Color of Urine, No Urgency or Frequency, No Flank pain.  Musculoskeletal: No Joint Pain or Swelling, No Decreased Range of Motion, No Back Pain.  Neurologic: No Syncope, No Seizures, Muscle Weakness, Paresthesia, Vision Disturbance or Loss, No Diplopia, No Vertigo, No Difficulty Walking,  Skin: No Rash or Lesions. Psych: No Change in Mood or Affect, No Depression or Anxiety, No Memory loss, No Confusion, or Hallucinations   Past Medical History  Diagnosis Date  . 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 Hyperlipidemia   . GERD  (gastroesophageal reflux disease) 01/13/2011  . Depression   . Diabetes mellitus type 2, noninsulin dependent 01/13/2011  . Hypertensive heart disease without CHF   . High blood pressure     Past Surgical History  Procedure Laterality Date  . Prostatectomy    . Cardiac catheterization  2005    No CAD     Prior to Admission medications   Medication Sig Start Date End Date Taking? Authorizing Provider  aspirin EC 81 MG tablet Take 81 mg by mouth daily.   Yes Historical Provider, MD  Aspirin-Acetaminophen-Caffeine (GOODY HEADACHE PO) Take 1 packet by mouth daily as needed (headache).   Yes Historical Provider, MD  carvedilol (COREG) 6.25 MG tablet Take 6.25 mg by mouth 2 (two) times daily with a meal.   Yes Historical Provider, MD  dicyclomine (BENTYL) 20 MG tablet Take 20 mg by mouth every 6 (six) hours as needed for spasms (stomach gurgling).  01/19/14  Yes Historical Provider, MD  glimepiride (AMARYL) 2 MG tablet Take 2 mg by mouth 2 (two) times daily.    Yes Historical Provider, MD  lisinopril (PRINIVIL,ZESTRIL) 10 MG tablet Take 10 mg by mouth 2 (two) times daily.  11/20/13  Yes Historical Provider, MD  metFORMIN (GLUCOPHAGE) 1000 MG tablet Take 1,000 mg by mouth 2 (two) times daily with a meal.     Yes Historical Provider, MD  Multiple Vitamin (MULTIVITAMIN WITH MINERALS) TABS tablet Take 2 tablets by mouth daily.   Yes Historical Provider, MD  naproxen sodium (ALEVE) 220 MG tablet Take 440 mg by mouth 2 (two) times daily as needed (  pain).   Yes Historical Provider, MD  omeprazole (PRILOSEC OTC) 20 MG tablet Take 20 mg by mouth daily.   Yes Historical Provider, MD  pravastatin (PRAVACHOL) 40 MG tablet Take 40 mg by mouth daily.  12/28/13  Yes Historical Provider, MD  sertraline (ZOLOFT) 50 MG tablet Take 50 mg by mouth daily. 01/06/14  Yes Historical Provider, MD  HYDROcodone-acetaminophen (NORCO/VICODIN) 5-325 MG per tablet Take 1-2 tablets by mouth every 6 hours as needed for  pain. Patient not taking: Reported on 01/30/2014 06/29/13   Elmyra Ricks Pisciotta, PA-C  nortriptyline (PAMELOR) 10 MG capsule One tablet every night for one week, then 2 tablets every night Patient not taking: Reported on 01/30/2014 03/26/13   Marcial Pacas, MD      Allergies  Allergen Reactions  . Iohexol Nausea And Vomiting    Vomits immediately after IV contrast     Social History:  reports that he has never smoked. He has never used smokeless tobacco. He reports that he does not drink alcohol or use illicit drugs.     Family History  Problem Relation Age of Onset  . Colon cancer Father   . Cancer Mother   . Stroke Brother        Physical Exam:  GEN:  Pleasant Well Nourished and Well Developed  64 y.o. African American male examined  and in no acute distress; cooperative with exam Filed Vitals:   01/30/14 1430 01/30/14 1450 01/30/14 1602 01/30/14 1822  BP:  113/72 167/75 151/84  Pulse: 59  75 75  Temp:    98.6 F (37 C)  TempSrc:    Oral  Resp: 14  17 18   Height:    5\' 8"  (1.727 m)  Weight:    61.1 kg (134 lb 11.2 oz)  SpO2: 99%  100% 100%   Blood pressure 151/84, pulse 75, temperature 98.6 F (37 C), temperature source Oral, resp. rate 18, height 5\' 8"  (1.727 m), weight 61.1 kg (134 lb 11.2 oz), SpO2 100 %. PSYCH: He is alert and oriented x4; does not appear anxious does not appear depressed; affect is normal HEENT: Normocephalic and Atraumatic, Mucous membranes pink; PERRLA; EOM intact; Fundi:  Benign;  No scleral icterus, Nares: Patent, Oropharynx: Clear, Fair Dentition,    Neck:  FROM, No Cervical Lymphadenopathy nor Thyromegaly or Carotid Bruit; No JVD; Breasts:: Not examined CHEST WALL: No tenderness CHEST: Normal respiration, clear to auscultation bilaterally HEART: Regular rate and rhythm; no murmurs rubs or gallops BACK: No kyphosis or scoliosis; No CVA tenderness ABDOMEN: Positive Bowel Sounds, Scaphoid, Soft Non-Tender; No Masses, No Organomegaly. Rectal Exam: Not  done EXTREMITIES: No Cyanosis, Clubbing, or Edema; No Ulcerations. Genitalia: not examined PULSES: 2+ and symmetric SKIN: Normal hydration no rash or ulceration CNS:  Alert and oriented x 4, No focal Deficits Vascular: pulses palpable throughout    Labs on Admission:  Basic Metabolic Panel:  Recent Labs Lab 01/30/14 1315  NA 132*  K 3.9  CL 99  CO2 24  GLUCOSE 103*  BUN 12  CREATININE 0.71  CALCIUM 9.2   Liver Function Tests: No results for input(s): AST, ALT, ALKPHOS, BILITOT, PROT, ALBUMIN in the last 168 hours. No results for input(s): LIPASE, AMYLASE in the last 168 hours. No results for input(s): AMMONIA in the last 168 hours. CBC:  Recent Labs Lab 01/30/14 1315  WBC 6.4  HGB 15.4  HCT 43.8  MCV 89.9  PLT 210   Cardiac Enzymes:  Recent Labs Lab 01/30/14 1315  TROPONINI <  0.03    BNP (last 3 results) No results for input(s): PROBNP in the last 8760 hours. CBG:  Recent Labs Lab 01/30/14 1827  GLUCAP 91    Radiological Exams on Admission: Dg Chest 2 View  01/30/2014   CLINICAL DATA:  One day history of chest pain.  Left arm tingling  EXAM: CHEST  2 VIEW  COMPARISON:  July 21, 2012  FINDINGS: Lungs are clear. Heart size and pulmonary vascularity are normal. No adenopathy. No pneumothorax. No bone lesions.  IMPRESSION: No edema or consolidation.   Electronically Signed   By: Lowella Grip M.D.   On: 01/30/2014 14:29     EKG: Independently reviewed. Normal sinus Rhythm rate = 73 , without Acute S-T changes   Assessment/Plan:   64 y.o. male with  Principal Problem:   1.   Chest pain   Telemetry Monitoring   Cycle Troponins   Nitropaste, O2 ASA,    Continue Lisinopril and Pravochol Rx   Check Fasting Lipids and HbA1C in AM   2D ECHO in AM   Consider Repeat Stress Testing    Active Problems:   2.   Diabetes mellitus type 2, noninsulin dependent   Holding Metformin and Amaryl Rx   SSI coverage PRN   Check HbA1C in AM     3.    Hyperlipidemia   Continue Pravachol RX   Check Fasting Lipdis in AM     4.   High blood pressure   Continue Lisinopril Rx   Monitor BPs     5.   GERD (gastroesophageal reflux disease)   Continue PPI- Omeprazole Rx      6.    DVT Prophylaxis   Lovenox      Code Status:    FULL CODE Family Communication:    Family Present Disposition Plan:       Observation Telemetry Unit  Time spent:  [place] [facility] Pager [phone]   If [place] Please Contact the Day Rounding Team MD for [facility]  If 7PM-7AM, Please Contact Night-Floor Coverage  [email or web address] Password [facility] 01/30/2014, 8:33 PM

## 2014-01-31 ENCOUNTER — Encounter (HOSPITAL_COMMUNITY): Payer: Self-pay | Admitting: Physician Assistant

## 2014-01-31 DIAGNOSIS — R1030 Lower abdominal pain, unspecified: Secondary | ICD-10-CM

## 2014-01-31 DIAGNOSIS — R634 Abnormal weight loss: Secondary | ICD-10-CM

## 2014-01-31 HISTORY — DX: Lower abdominal pain, unspecified: R10.30

## 2014-01-31 HISTORY — DX: Abnormal weight loss: R63.4

## 2014-01-31 LAB — CBC
HCT: 42.2 % (ref 39.0–52.0)
Hemoglobin: 14.9 g/dL (ref 13.0–17.0)
MCH: 31.4 pg (ref 26.0–34.0)
MCHC: 35.3 g/dL (ref 30.0–36.0)
MCV: 89 fL (ref 78.0–100.0)
PLATELETS: 201 10*3/uL (ref 150–400)
RBC: 4.74 MIL/uL (ref 4.22–5.81)
RDW: 12 % (ref 11.5–15.5)
WBC: 4.8 10*3/uL (ref 4.0–10.5)

## 2014-01-31 LAB — BASIC METABOLIC PANEL
ANION GAP: 7 (ref 5–15)
BUN: 11 mg/dL (ref 6–23)
CHLORIDE: 100 meq/L (ref 96–112)
CO2: 25 mmol/L (ref 19–32)
CREATININE: 0.82 mg/dL (ref 0.50–1.35)
Calcium: 8.7 mg/dL (ref 8.4–10.5)
GFR calc Af Amer: >90 mL/min (ref >90)
GLUCOSE: 107 mg/dL — AB (ref 70–99)
POTASSIUM: 4.5 mmol/L (ref 3.5–5.1)
Sodium: 132 mmol/L — ABNORMAL LOW (ref 135–145)

## 2014-01-31 LAB — HEMOGLOBIN A1C
Hgb A1c MFr Bld: 6.6 % — ABNORMAL HIGH (ref <5.7)
Mean Plasma Glucose: 143 mg/dL — ABNORMAL HIGH (ref <117)

## 2014-01-31 LAB — GLUCOSE, CAPILLARY
GLUCOSE-CAPILLARY: 120 mg/dL — AB (ref 70–99)
GLUCOSE-CAPILLARY: 181 mg/dL — AB (ref 70–99)
Glucose-Capillary: 108 mg/dL — ABNORMAL HIGH (ref 70–99)
Glucose-Capillary: 122 mg/dL — ABNORMAL HIGH (ref 70–99)

## 2014-01-31 LAB — TROPONIN I: Troponin I: <0.03 ng/mL (ref <0.031)

## 2014-01-31 LAB — LIPID PANEL
Cholesterol: 130 mg/dL (ref 0–200)
HDL: 28 mg/dL — ABNORMAL LOW (ref >39)
LDL Cholesterol: 85 mg/dL (ref 0–99)
TRIGLYCERIDES: 84 mg/dL (ref <150)
Total CHOL/HDL Ratio: 4.6 RATIO
VLDL: 17 mg/dL (ref 0–40)

## 2014-01-31 MED ORDER — PANTOPRAZOLE SODIUM 20 MG PO TBEC
20.0000 mg | DELAYED_RELEASE_TABLET | Freq: Every day | ORAL | Status: DC
  Administered 2014-01-31 – 2014-02-02 (×3): 20 mg via ORAL
  Filled 2014-01-31 (×3): qty 1

## 2014-01-31 MED ORDER — CARVEDILOL 6.25 MG PO TABS: 6.2500 mg | ORAL_TABLET | Freq: Two times a day (BID) | ORAL | Status: DC

## 2014-01-31 MED ORDER — ENOXAPARIN SODIUM 40 MG/0.4ML ~~LOC~~ SOLN
40.0000 mg | Freq: Every day | SUBCUTANEOUS | Status: DC
  Administered 2014-01-31 – 2014-02-01 (×2): 40 mg via SUBCUTANEOUS
  Filled 2014-01-31 (×2): qty 0.4

## 2014-01-31 NOTE — Consult Note (Signed)
Cardiology Consultation Note  Patient ID: [name], MRN: [id number], DOB/AGE: 1950/07/10 63 y.o. Admit date: 01/30/2014   Date of Consult: 01/31/2014 Primary Physician: Justin Caraway, MD Primary Cardiologist: [name] to be [name] remotely  Chief Complaint: CP Reason for Consult: CP, unintentional weight loss  HPI: [name] is a 64 y/o M with history of GERD, HLD, HTN, DM, possible prior esophageal spasm, prostate CA s/p prostatectomy, and normal prior cardiac workup who presented to [facility] 01/30/2014 with chest pain. He had normal coronaries by cath in 2005 and unremarkable nuc/echo in 12/2010. 2D Echo 12/2010: EF normal (% not given), no RWMA, trivial AI, aortic root trivially dilated, mild MR. He says he has been followed by [name] over the last several months due to lower abdominal pain but does not know really what his diagnosis has been. He reports last colonoscopy was several years ago and denies h/o EGD.  The night before last, he developed substernal chest tightness that was intermittent, located in the middle chest with radiation to the left side. He was able to rest but then when he woke up yesterday morning the pain continued on an intermittent basis. 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 It was not associated with any SOB, nausea, vomiting, diaphoresis, palpitations or cough. It was not brought on by anything or made worse with anything (including exertion, inspiration or palpation). He did not try anything for the pain at home to ease the pain, but states it was relieved by 1 SL NTG quickly in the ED. No recent long travel, surgery, bedrest, LEE, tachypnea, hypoxia, hemoptysis, BRBPR, melena or hematemesis. He does note he's lost about 11 lbs over the last 6 months unintentionally. However, he sometimes skips meals to help with his blood sugar. He still c/o  lower abdominal discomfort at present time. Every now and then, he able to feel a recurrence of the chest pain. He exercises at home on a hand-crank as well as walking around his house/park without any recent exertional symptoms or limitations.  Troponins neg x 4. BMET/CBC wnl except mild hyperglycemia c/w DM, Na 132 which appears decreased from prior. LDL 85. A1C in process. CXR: no acute disease. He had 1 brother who had bypass in his 51s (57 other siblings, 1 with HTN, 1 with fall/?stroke).  Past Medical History  Diagnosis Date  . 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 Hyperlipidemia   . GERD (gastroesophageal reflux disease) 01/13/2011  . Depression   . Diabetes mellitus type 2, noninsulin dependent 01/13/2011  . Essential hypertension   . Esophageal spasm   . History of cardiac cath     a. 2005: normal cors, normal EF. b. Nuc 12/2010: normal, EF 61%. 2D Echo 12/2010: EF normal (% not given), no RWMA, trivial AI, aortic root trivially dilated, mild MR.      Most Recent Cardiac Studies: 2D Echo 12/2010 - Left ventricle: The cavity size was normal. Systolic function was normal. Wall motion was normal; there were no regional wall motion abnormalities. - Aortic valve: Trivial regurgitation. - Aortic root: The aortic root was trivially dilated. - Mitral valve: Mild regurgitation.  Nuc 12/2010 IMPRESSION: No evidence of ischemia or infarction. Ejection fraction 61%.  Cath 2005 PROCEDURE: Left heart catheterization with selective coronary angiography, left ventricular angiography and AngioSeal. CARDIOLOGIST:  [name]. [name], M.D. INDICATIONS: [name] is a 64 year old male who presents with chest pain. He has had previous hospitalizations for evaluation of chest pain. He has diabetes, hypercholesterolemia, hypertension and positive family history of heart disease. TYPE AND SITE OF ENTRY: Percutaneous right femoral  artery. CATHETERS: A 6 French 4 curved Judkins right and left coronary catheters, 6 French pigtail ventriculographic catheter. CONTRAST MATERIAL: Omnipaque. MEDICATIONS GIVEN PRIOR TO THE PROCEDURE: Valium 10 mg p.o. MEDICATIONS GIVEN DURING THE PROCEDURE: Versed 3 mg IV. COMMENTS: The patient tolerated the procedure well. HEMODYNAMIC DATA: The aortic pressure was 99/68. LV was 107/2-7. There was no aortic valve gradient noted on pullback. ANGIOGRAPHIC DATA: 1. The left main coronary artery was normal. 2. The left circumflex continues primarily as a moderately large obtuse  marginal. The obtuse marginal continues as a relatively smooth large  vessel and then terminates in a trifurcation with three moderate size  vessels to the posterior lateral wall. It is normal. 3. Left anterior descending is a moderately large vessel that wraps around  the tip of the apex. There is one prominently large diagonal vessel.  The left anterior descending and diagonal vessel are normal. 4. Right coronary artery is a reasonably large dominant system. The origin  of the right coronary artery has a somewhat downward and inferior origin  and takeoff. The right coronary artery is normal. The posterior  descending and the posterior lateral branches are essentially normal. LEFT VENTRICULOGRAPHY: Left ventricular angiogram was performed in the RAO position. The overall cardiac size and silhouette were normal. The global ejection fraction is 60-65%. Regional wall motion is normal. There is no mitral regurgitation, intracardiac calcification or intracavitary filling defect. OVERALL IMPRESSION: 1. Normal coronary arteries. 2. Normal left ventricular function.   Surgical History:  Past Surgical History  Procedure Laterality Date  . Prostatectomy    . Cardiac catheterization  2005    No CAD     Home Meds: Prior to Admission medications    Medication Sig Start Date End Date Taking? Authorizing Provider  aspirin EC 81 MG tablet Take 81 mg by mouth daily.   Yes Historical Provider, MD  Aspirin-Acetaminophen-Caffeine (GOODY HEADACHE PO) Take 1 packet by mouth daily as needed (headache).   Yes Historical Provider, MD  carvedilol (COREG) 6.25 MG tablet Take 6.25 mg by mouth 2 (two) times daily with a meal.   Yes Historical Provider, MD  dicyclomine (BENTYL) 20 MG tablet Take 20 mg by mouth every 6 (six) hours as needed for spasms (stomach gurgling).  01/19/14  Yes Historical Provider, MD  glimepiride (AMARYL) 2 MG tablet Take 2 mg by mouth 2 (two) times daily.    Yes Historical Provider, MD  lisinopril (PRINIVIL,ZESTRIL) 10 MG tablet Take 10 mg by mouth 2 (two) times daily.  11/20/13  Yes Historical Provider, MD  metFORMIN (GLUCOPHAGE) 1000 MG tablet Take 1,000 mg by mouth 2 (two) times daily with a meal.     Yes Historical Provider, MD  Multiple Vitamin (MULTIVITAMIN WITH MINERALS) TABS tablet Take 2 tablets by mouth daily.   Yes Historical Provider, MD  naproxen sodium (ALEVE) 220 MG tablet Take 440 mg by mouth 2 (two) times daily as needed (pain).   Yes Historical Provider, MD  omeprazole (PRILOSEC OTC) 20 MG tablet Take 20 mg by mouth daily.   Yes Historical Provider, MD  pravastatin (PRAVACHOL) 40 MG tablet Take 40 mg by mouth daily.  12/28/13  Yes Historical Provider, MD  sertraline (ZOLOFT) 50 MG tablet Take  50 mg by mouth daily. 01/06/14  Yes Historical Provider, MD  HYDROcodone-acetaminophen (NORCO/VICODIN) 5-325 MG per tablet Take 1-2 tablets by mouth every 6 hours as needed for pain. Patient not taking: Reported on 01/30/2014 06/29/13   Elmyra Ricks Pisciotta, PA-C  nortriptyline (PAMELOR) 10 MG capsule One tablet every night for one week, then 2 tablets every night Patient not taking: Reported on 01/30/2014 03/26/13   Marcial Pacas, MD    Inpatient Medications:  . aspirin EC  325 mg Oral Daily  . carvedilol  6.25 mg Oral BID WC  .  enoxaparin (LOVENOX) injection  40 mg Subcutaneous QHS  . insulin aspart  0-5 Units Subcutaneous QHS  . insulin aspart  0-9 Units Subcutaneous TID WC  . lisinopril  10 mg Oral BID  . nitroGLYCERIN  0.5 inch Topical 4 times per day  . nitroGLYCERIN  1 inch Topical 4 times per day  . [START ON 02/01/2014] pantoprazole  20 mg Oral Daily  . pravastatin  40 mg Oral Daily  . sertraline  50 mg Oral Daily  . sodium chloride  3 mL Intravenous Q12H  . sodium chloride  3 mL Intravenous Q12H      Allergies:  Allergies  Allergen Reactions  . Iohexol Nausea And Vomiting    Vomits immediately after IV contrast    History   Social History  . Marital Status: Legally Separated    Spouse Name: N/A    Number of Children: 5  . Years of Education: 12   Occupational History  . BUS DRIVER    Social History Main Topics  . Smoking status: Never Smoker   . Smokeless tobacco: Never Used  . Alcohol Use: No  . Drug Use: No  . Sexual Activity: Not on file   Other Topics Concern  . Not on file   Social History Narrative   Patient is divorced and lives alone.   Patient has five children.   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     Patient has a high school education.   Patient is right-handed.   Patient drinks two cups of tea daily.     Family History  Problem Relation Age of Onset  . Colon cancer Father   . Cancer Mother   . Stroke Brother      Review of Systems:see above All other systems reviewed and are otherwise negative except as noted above.  Labs:  Recent Labs  01/30/14 1315 01/30/14 2020 01/31/14 0138 01/31/14 0740  TROPONINI <0.03 <0.03 <0.03 <0.03   Lab Results  Component Value Date   WBC 4.8 01/31/2014   HGB 14.9 01/31/2014   HCT 42.2 01/31/2014   MCV 89.0 01/31/2014   PLT 201 01/31/2014    Recent Labs Lab 01/31/14 0740  NA 132*  K 4.5  CL 100  CO2 25  BUN 11  CREATININE 0.82  CALCIUM 8.7  GLUCOSE 107*   Lab  Results  Component Value Date   CHOL 130 01/31/2014   HDL 28* 01/31/2014   LDLCALC 85 01/31/2014   TRIG 84 01/31/2014     Radiology/Studies:  Dg Chest 2 View  01/30/2014   CLINICAL DATA:  One day history of chest pain.  Left arm tingling  EXAM: CHEST  2 VIEW  COMPARISON:  July 21, 2012  FINDINGS: Lungs are clear. Heart size and pulmonary vascularity are normal. No adenopathy. No pneumothorax. No bone lesions.  IMPRESSION: No edema or consolidation.   Electronically  Signed   By: Lowella Grip M.D.   On: 01/30/2014 14:29   EKG: NSR 73bpm, occasional PAC, nonspecific ST cange in avL otherwise nonacute  Physical Exam: Blood pressure 103/63, pulse 70, temperature 98.5 F (36.9 C), temperature source Oral, resp. rate 18, height 5\' 8"  (1.727 m), weight 134 lb 6.4 oz (60.963 kg), SpO2 99 %. General: Well developed, well nourished, in no acute distress. Head: Normocephalic, atraumatic, sclera non-icteric, no xanthomas, nares are without discharge.  Neck: Negative for carotid bruits. JVD not elevated. Lungs: Clear bilaterally to auscultation without wheezes, rales, or rhonchi. Breathing is unlabored. Heart: RRR with S1 S2. No murmurs, rubs, or gallops appreciated. Abdomen: Soft, non-tender, non-distended with normoactive bowel sounds. No hepatomegaly. No rebound/guarding. No obvious abdominal masses. Msk:  Strength and tone appear normal for age. Extremities: No clubbing or cyanosis. No edema.  Distal pedal pulses are 2+ and equal bilaterally. Neuro: Alert and oriented X 3. No facial asymmetry. No focal deficit. Moves all extremities spontaneously. Psych:  Responds to questions appropriately with a normal affect.   Assessment and Plan:   1. Chest pain, mostly atypical, with negative troponins 2. Recent lower abdominal pain and unintentional weight los 3. HTN, controlled 4. Diabetes mellitus 5. Hyperlipidemia - LDL 85  Symptoms are mostly atypical for cardiac pain and there has been no  objective evidence of ischemia thus far. Longest episode was 2 hours prior to arrival. CP was responsive to 1 SL NTG. He does have cardiac risk factors. Will discuss with MD but anticipate updating stress testing for risk assessment. I will defer workup of lower abdominal pain to IM - this is his salient complaint right now. He also endorses unintentional weight loss (145->134lb in 6 months) but also admits to skipping meals at times. This will require further workup per IM. Might consider GI eval while inpatient. Unclear etiology of hyponatremia as well - not on a diuretic and no active pulm disease at present time.  See below for further discussion.  Signed, Dayna Dunn PA-C 01/31/2014, 1:42 PM  I have seen and examined the patient along with Dayna Dunn PA-C.  I have reviewed the chart, notes and new data.  I agree with PA's note.  Key new complaints: no further chest pain Key examination changes: no arrhythmia, signs of CHF or cardiac exam abnormalities Key new findings / data: normal ECG except PACs  PLAN: Low likelihood that he had an acute coronary event. Stress test in AM. Weight loss and current symptoms worrisome for potential GE neoplasm. Endoscopy is probably appropriate next step in workup.  Sanda Klein, MD, [place] [phone] 01/31/2014, 3:16 PM

## 2014-01-31 NOTE — Progress Notes (Signed)
PROGRESS NOTE  [name] PYP:[id number] DOB: [birth year] DOA: 01/30/2014 PCP: Justin Caraway, MD  Assessment/Plan: Chest pain Telemetry Monitoring Cycle Troponins Nitropaste, O2 ASA, Continue Lisinopril and Pravochol Rx Check Fasting Lipids and HbA1C in AM 2D ECHO Consider Repeat Stress Testing   Diabetes mellitus type 2, noninsulin dependent Holding Metformin and Amaryl Rx SSI coverage PRN Check HbA1C in AM  Hyperlipidemia Continue Pravachol RX Check Fasting Lipdis in AM   High blood pressure Continue Lisinopril Rx Monitor BPs  GERD (gastroesophageal reflux disease) Continue PPI- Omeprazole Rx  Code Status: full Family Communication: patient and family Disposition Plan:    Consultants:  cards  Procedures:    HPI/Subjective: Continues with chest pain  Objective: Filed Vitals:   01/31/14 1217  BP: 103/63  Pulse: 70  Temp: 98.5 F (36.9 C)  Resp: 18   No intake or output data in the 24 hours ending 01/31/14 1238 Filed Weights   01/30/14 1247 01/30/14 1822 01/31/14 0400  Weight: 63.504 kg (140 lb) 61.1 kg (134 lb 11.2 oz) 60.963 kg (134 lb 6.4 oz)    Exam:   General:  A+Ox3, NAD  Cardiovascular: rrr  Respiratory: clear  Abdomen: +BS, soft  Musculoskeletal: no edema   Data Reviewed: Basic Metabolic Panel:  Recent Labs Lab 01/30/14 1315 01/31/14 0740  NA 132* 132*  K 3.9 4.5  CL 99 100  CO2 24 25  GLUCOSE 103* 107*  BUN 12 11  CREATININE 0.71 0.82  CALCIUM 9.2 8.7   Liver Function Tests: No results for input(s): AST, ALT, ALKPHOS, BILITOT, PROT, ALBUMIN in the last 168  hours. No results for input(s): LIPASE, AMYLASE in the last 168 hours. No results for input(s): AMMONIA in the last 168 hours. CBC:  Recent Labs Lab 01/30/14 1315 01/31/14 0740  WBC 6.4 4.8  HGB 15.4 14.9  HCT 43.8 42.2  MCV 89.9 89.0  PLT 210 201   Cardiac Enzymes:  Recent Labs Lab 01/30/14 1315 01/30/14 2020 01/31/14 0138 01/31/14 0740  TROPONINI <0.03 <0.03 <0.03 <0.03   BNP (last 3 results) No results for input(s): PROBNP in the last 8760 hours. CBG:  Recent Labs Lab 01/30/14 1827 01/30/14 2047 01/31/14 0731 01/31/14 1142  GLUCAP 91 194* 108* 120*    No results found for this or any previous visit (from the past 240 hour(s)).   Studies: Dg Chest 2 View  01/30/2014   CLINICAL DATA:  One day history of chest pain.  Left arm tingling  EXAM: CHEST  2 VIEW  COMPARISON:  July 21, 2012  FINDINGS: Lungs are clear. Heart size and pulmonary vascularity are normal. No adenopathy. No pneumothorax. No bone lesions.  IMPRESSION: No edema or consolidation.   Electronically Signed   By: Lowella Grip M.D.   On: 01/30/2014 14:29    Scheduled Meds: . aspirin EC  325 mg Oral Daily  . carvedilol  6.25 mg Oral BID WC  . enoxaparin (LOVENOX) injection  40 mg Subcutaneous QHS  . insulin aspart  0-5 Units Subcutaneous QHS  . insulin aspart  0-9 Units Subcutaneous TID WC  . lisinopril  10 mg Oral BID  . nitroGLYCERIN  0.5 inch Topical 4 times per day  . nitroGLYCERIN  1 inch Topical 4 times per day  . [START ON 02/01/2014] pantoprazole  20 mg Oral Daily  . pravastatin  40 mg Oral Daily  . sertraline  50 mg Oral Daily  . sodium chloride  3 mL Intravenous Q12H  . sodium chloride  3  mL Intravenous Q12H   Continuous Infusions:  Antibiotics Given (last 72 hours)    None      Principal Problem:   Chest pain Active Problems:   Diabetes mellitus type 2, noninsulin dependent   Hyperlipidemia   GERD (gastroesophageal reflux disease)   High blood pressure   Pain in the  chest    Time spent: 25 min    [name]  [facility] Pager [phone]. If 7PM-7AM, please contact night-coverage at [email or web address], password [facility] 01/31/2014, 12:38 PM  LOS: 1 day

## 2014-01-31 NOTE — Progress Notes (Signed)
Utilization Review Completed.   Marchele Decock, RN, BSN Nurse Case Manager  

## 2014-01-31 NOTE — Progress Notes (Signed)
Noted in MD progress notes plan for 2D echo in am.  However, no order in place. Contacted on call NP for 2D echo order.  Will continue to monitor.

## 2014-01-31 NOTE — Progress Notes (Signed)
To clarify, ETT-nuc ordered. We wrote on Coreg rx and nursing care order to hold dose of Coreg tonight and in AM in prep for stress test. Melina Copa PA-C

## 2014-02-01 ENCOUNTER — Observation Stay (HOSPITAL_COMMUNITY): Payer: Medicare Other

## 2014-02-01 DIAGNOSIS — R200 Anesthesia of skin: Secondary | ICD-10-CM

## 2014-02-01 DIAGNOSIS — E119 Type 2 diabetes mellitus without complications: Secondary | ICD-10-CM | POA: Diagnosis not present

## 2014-02-01 DIAGNOSIS — G459 Transient cerebral ischemic attack, unspecified: Secondary | ICD-10-CM

## 2014-02-01 DIAGNOSIS — R531 Weakness: Secondary | ICD-10-CM | POA: Insufficient documentation

## 2014-02-01 DIAGNOSIS — R079 Chest pain, unspecified: Secondary | ICD-10-CM | POA: Insufficient documentation

## 2014-02-01 DIAGNOSIS — E785 Hyperlipidemia, unspecified: Secondary | ICD-10-CM | POA: Diagnosis not present

## 2014-02-01 DIAGNOSIS — R634 Abnormal weight loss: Secondary | ICD-10-CM

## 2014-02-01 DIAGNOSIS — I10 Essential (primary) hypertension: Secondary | ICD-10-CM | POA: Diagnosis not present

## 2014-02-01 DIAGNOSIS — R0789 Other chest pain: Secondary | ICD-10-CM

## 2014-02-01 DIAGNOSIS — M6289 Other specified disorders of muscle: Secondary | ICD-10-CM

## 2014-02-01 HISTORY — DX: Transient cerebral ischemic attack, unspecified: G45.9

## 2014-02-01 HISTORY — DX: Anesthesia of skin: R20.0

## 2014-02-01 LAB — GLUCOSE, CAPILLARY
GLUCOSE-CAPILLARY: 134 mg/dL — AB (ref 70–99)
GLUCOSE-CAPILLARY: 186 mg/dL — AB (ref 70–99)
GLUCOSE-CAPILLARY: 218 mg/dL — AB (ref 70–99)
Glucose-Capillary: 164 mg/dL — ABNORMAL HIGH (ref 70–99)
Glucose-Capillary: 129 mg/dL — ABNORMAL HIGH (ref 70–99)

## 2014-02-01 MED ORDER — REGADENOSON 0.4 MG/5ML IV SOLN
INTRAVENOUS | Status: AC
  Administered 2014-02-01: 0.4 mg via INTRAVENOUS
  Filled 2014-02-01: qty 5

## 2014-02-01 MED ORDER — HYDRALAZINE HCL 20 MG/ML IJ SOLN: 10.0000 mg | Freq: Four times a day (QID) | INTRAMUSCULAR | Status: DC | PRN

## 2014-02-01 MED ORDER — TECHNETIUM TC 99M SESTAMIBI GENERIC - CARDIOLITE
30.0000 | Freq: Once | INTRAVENOUS | Status: AC | PRN
  Administered 2014-02-01: 30 via INTRAVENOUS

## 2014-02-01 MED ORDER — TECHNETIUM TC 99M SESTAMIBI GENERIC - CARDIOLITE
10.0000 | Freq: Once | INTRAVENOUS | Status: AC | PRN
  Administered 2014-02-01: 10 via INTRAVENOUS

## 2014-02-01 MED ORDER — LISINOPRIL 20 MG PO TABS
20.0000 mg | ORAL_TABLET | Freq: Two times a day (BID) | ORAL | Status: DC
  Administered 2014-02-01 – 2014-02-02 (×2): 20 mg via ORAL
  Filled 2014-02-01 (×2): qty 1

## 2014-02-01 MED ORDER — CARVEDILOL 12.5 MG PO TABS
12.5000 mg | ORAL_TABLET | Freq: Two times a day (BID) | ORAL | Status: DC
  Administered 2014-02-01 – 2014-02-02 (×2): 12.5 mg via ORAL
  Filled 2014-02-01 (×2): qty 1

## 2014-02-01 MED ORDER — REGADENOSON 0.4 MG/5ML IV SOLN
0.4000 mg | Freq: Once | INTRAVENOUS | Status: AC
  Administered 2014-02-01: 0.4 mg via INTRAVENOUS

## 2014-02-01 NOTE — Progress Notes (Signed)
[facility] progress note. Chief complaint. Left facial and left arm numbness. History of present illness. This 64 year old male with prior history hypertension, diabetes, hyperlipidemia in hospital with atypical chest pain. He complained of sudden onset left facial and left arm numbness beginning at about 05:00. Nursing notify me and I came up to see the patient at bedside. I found the patient alert and oriented and in no acute distress. He states he felt like something changed in his head and following that he developed the left facial and left arm numbness. He states he is never experienced symptoms similar to this. He denies any chest pain.  physical exam. Vital signs. temperature 97.9, pulse 66, respiration 20, blood pressure 168/100. O2 sats 100%. Cardiac. Regular rate and rhythm. Lungs. Breath sounds clear and equal. Abdomen. Soft with positive bowel sounds. No pain. Neurologic. Cranial nerves II-12 grossly intact. No unilateral or focal defects. Strength 5/5 and equal in all 4 extremities. Impression/plan. Problem #1. Left facial and left arm numbness of unclear etiology. Patient sent for stat CT scan of the head. Results are still pending. I discussed the case with Dr. Doy Mince neurology who is kindly consented to see the patient in consult post CT scan.

## 2014-02-01 NOTE — Progress Notes (Signed)
INITIAL NUTRITION ASSESSMENT  DOCUMENTATION CODES Per approved criteria  -Not Applicable   INTERVENTION:  Diet advancement post-procedure per MD. Recommend regular diet with Ensure Complete PO BID, each supplement provides 350 kcal and 13 grams of protein  NUTRITION DIAGNOSIS: Inadequate oral intake related to poor appetite as evidenced by 8% weight loss.   Goal: Intake to meet >90% of estimated nutrition needs.  Monitor:  PO intake, labs, weight trend.  Reason for Assessment: Malnutrition Screening Tool  64 y.o. male  Admitting Dx: Chest pain  ASSESSMENT: Patient presented to the ED on 1/9 with chest pain. Ruled out for MI. MRI showed no acute abnormality. Work-up ongoing.  Patient not in room at this time. Spoke with patient's family who reports that he has not been eating well for the past few months. He forgets to eat sometimes. He has lost weight, but they are unsure how much weight he has lost. There are two Glucerna Shake supplements in his room that the family says he has not tried.   Patient with 8% weight loss, unsure of the time frame. At nutrition risk due to recent weight loss and poor PO intake.  Height: Ht Readings from Last 1 Encounters:  01/30/14 5\' 8"  (1.727 m)    Weight: Wt Readings from Last 1 Encounters:  02/01/14 134 lb 7.7 oz (61 kg)    Ideal Body Weight: 70 kg  % Ideal Body Weight: 87%  Wt Readings from Last 10 Encounters:  02/01/14 134 lb 7.7 oz (61 kg)  04/27/13 145 lb (65.772 kg)  03/26/13 147 lb 12 oz (67.019 kg)  01/15/13 147 lb (66.679 kg)  07/21/12 146 lb (66.225 kg)  11/29/11 147 lb 1.6 oz (66.724 kg)  01/14/11 142 lb 3.2 oz (64.501 kg)    Usual Body Weight: 145 lb  % Usual Body Weight: 92%  BMI:  Body mass index is 20.45 kg/(m^2).  Estimated Nutritional Needs: Kcal: 1800-2000 Protein: 85-100 gm Fluid: 1.8-2 L  Skin: WDL  Diet Order: Diet NPO time specified Except for: Sips with Meds  EDUCATION NEEDS: -Education  not appropriate at this time   Intake/Output Summary (Last 24 hours) at 02/01/14 1508 Last data filed at 02/01/14 1245  Gross per 24 hour  Intake    366 ml  Output      0 ml  Net    366 ml    Last BM: 1/9   Labs:   Recent Labs Lab 01/30/14 1315 01/31/14 0740  NA 132* 132*  K 3.9 4.5  CL 99 100  CO2 24 25  BUN 12 11  CREATININE 0.71 0.82  CALCIUM 9.2 8.7  GLUCOSE 103* 107*    CBG (last 3)   Recent Labs  02/01/14 0520 02/01/14 0738 02/01/14 1234  GLUCAP 134* 164* 129*    Scheduled Meds: . aspirin EC  325 mg Oral Daily  . carvedilol  6.25 mg Oral BID WC  . enoxaparin (LOVENOX) injection  40 mg Subcutaneous QHS  . insulin aspart  0-5 Units Subcutaneous QHS  . insulin aspart  0-9 Units Subcutaneous TID WC  . lisinopril  10 mg Oral BID  . nitroGLYCERIN  0.5 inch Topical 4 times per day  . pantoprazole  20 mg Oral Daily  . pravastatin  40 mg Oral Daily  . sertraline  50 mg Oral Daily  . sodium chloride  3 mL Intravenous Q12H  . sodium chloride  3 mL Intravenous Q12H    Continuous Infusions:   Past Medical History  Diagnosis Date  . 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 Hyperlipidemia   . GERD (gastroesophageal reflux disease) 01/13/2011  . Depression   . Diabetes mellitus type 2, noninsulin dependent 01/13/2011  . Essential hypertension   . Esophageal spasm   . History of cardiac cath     a. 2005: normal cors, normal EF. b. Nuc 12/2010: normal, EF 61%. 2D Echo 12/2010: EF normal (% not given), no RWMA, trivial AI, aortic root trivially dilated, mild MR.    Past Surgical History  Procedure Laterality Date  . Prostatectomy    . Cardiac catheterization  2005    No CAD    Molli Barrows, RD, LDN, [place] Pager [phone] After Hours Pager [phone]

## 2014-02-01 NOTE — Progress Notes (Signed)
PROGRESS NOTE  [name]:[id number] DOB: 08/22/1950 DOA: 01/30/2014 PCP: Cari Caraway, MD  Assessment/Plan: Chest pain Telemetry Monitoring Cycle Troponins Nitropaste, O2 ASA, Continue Lisinopril and Pravochol Rx Fasting Lipids  LDL: 85 and HbA1C: 6.6 2D ECHO Repeat Stress Testing   Diabetes mellitus type 2, noninsulin dependent Holding Metformin and Amaryl Rx SSI coverage PRN Check HbA1C in AM  Hyperlipidemia Continue Pravachol RX Check Fasting Lipdis in AM   High blood pressure Continue Lisinopril Rx Monitor BPs  GERD (gastroesophageal reflux disease) Continue PPI- Omeprazole Rx  Facial and arm numbness -MRI Carotids -neuro consult  Weight loss- Defer to PCP for further management and GI referral  Code Status: full Family Communication: patient and family Disposition Plan:    Consultants:  cards  Procedures:    HPI/Subjective: Says he developed b/l facial numbness and b/l arm numbness last PM- first left, then right   Objective: Filed Vitals:   02/01/14 0739  BP:   Pulse:   Temp: 98.2 F (36.8 C)  Resp:     Intake/Output Summary (Last 24 hours) at 02/01/14 1127 Last data filed at 01/31/14 2130  Gross per 24 hour  Intake    246 ml  Output      0 ml  Net    246 ml   Filed Weights   01/30/14 1822 01/31/14 0400 02/01/14 0400  Weight: 61.1 kg (134 lb 11.2 oz) 60.963 kg (134 lb 6.4 oz) 61 kg (134 lb 7.7 oz)    Exam:   General:  A+Ox3, NAD  Cardiovascular: rrr  Respiratory: clear  Abdomen: +BS, soft  Musculoskeletal: no edema   Data Reviewed: Basic Metabolic  Panel:  Recent Labs Lab 01/30/14 1315 01/31/14 0740  NA 132* 132*  K 3.9 4.5  CL 99 100  CO2 24 25  GLUCOSE 103* 107*  BUN 12 11  CREATININE 0.71 0.82  CALCIUM 9.2 8.7   Liver Function Tests: No results for input(s): AST, ALT, ALKPHOS, BILITOT, PROT, ALBUMIN in the last 168 hours. No results for input(s): LIPASE, AMYLASE in the last 168 hours. No results for input(s): AMMONIA in the last 168 hours. CBC:  Recent Labs Lab 01/30/14 1315 01/31/14 0740  WBC 6.4 4.8  HGB 15.4 14.9  HCT 43.8 42.2  MCV 89.9 89.0  PLT 210 201   Cardiac Enzymes:  Recent Labs Lab 01/30/14 1315 01/30/14 2020 01/31/14 0138 01/31/14 0740  TROPONINI <0.03 <0.03 <0.03 <0.03   BNP (last 3 results) No results for input(s): PROBNP in the last 8760 hours. CBG:  Recent Labs Lab 01/31/14 1142 01/31/14 1643 01/31/14 2137 02/01/14 0520 02/01/14 0738  GLUCAP 120* 122* 181* 134* 164*    No results found for this or any previous visit (from the past 240 hour(s)).   Studies: Dg Chest 2 View  01/30/2014   CLINICAL DATA:  One day history of chest pain.  Left arm tingling  EXAM: CHEST  2 VIEW  COMPARISON:  July 21, 2012  FINDINGS: Lungs are clear. Heart size and pulmonary vascularity are normal. No adenopathy. No pneumothorax. No bone lesions.  IMPRESSION: No edema or consolidation.   Electronically Signed   By: Lowella Grip M.D.   On: 01/30/2014 14:29   Ct Head Wo Contrast  02/01/2014   CLINICAL DATA:  Acute onset of left-sided numbness. Initial encounter.  EXAM: CT HEAD WITHOUT CONTRAST  TECHNIQUE: Contiguous axial images were obtained from the base of the skull through the vertex without intravenous contrast.  COMPARISON:  CT of the  head performed 01/15/2013, and MRI of the brain performed 04/09/2013  FINDINGS: There is no evidence of acute infarction, mass lesion, or intra- or extra-axial hemorrhage on CT.  Prominence of the sulci suggests mild cortical volume loss.  The brainstem and fourth  ventricle are within normal limits. The basal ganglia are unremarkable in appearance. The cerebral hemispheres demonstrate grossly normal gray-white differentiation. No mass effect or midline shift is seen.  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 The orbits are within normal limits. The paranasal sinuses and mastoid air cells are well-aerated. No significant soft tissue abnormalities are seen.  IMPRESSION: 1. No acute intracranial pathology seen on CT. 2. Mild cortical volume loss noted. 3. 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   Electronically Signed   By: Garald Balding M.D.   On: 02/01/2014 06:33    Scheduled Meds: . aspirin EC  325 mg Oral Daily  . carvedilol  6.25 mg Oral BID WC  . enoxaparin (LOVENOX) injection  40 mg Subcutaneous QHS  . insulin aspart  0-5 Units Subcutaneous QHS  . insulin aspart  0-9 Units Subcutaneous TID WC  . lisinopril  10 mg Oral BID  . nitroGLYCERIN  0.5 inch Topical 4 times per day  . pantoprazole  20 mg Oral Daily  . pravastatin  40 mg Oral Daily  . sertraline  50 mg Oral Daily  . sodium chloride  3 mL Intravenous Q12H  . sodium chloride  3 mL Intravenous Q12H   Continuous Infusions:  Antibiotics Given (last 72 hours)    None      Principal Problem:   Chest pain Active Problems:   Diabetes mellitus type 2, noninsulin dependent   Hyperlipidemia   GERD (gastroesophageal reflux disease)   Essential hypertension   Lower abdominal pain   Unintentional weight loss   Left sided numbness   TIA (transient ischemic attack)    Time spent: 25 min    Uliana Brinker, [place] [facility] Pager [phone]. If 7PM-7AM, please contact night-coverage at [email or web address], password [email or web address] 02/01/2014, 11:27 AM  LOS: 2 days

## 2014-02-01 NOTE — Progress Notes (Signed)
Subjective: No CP.  Feels better now than earlier  Objective: Vital signs in last 24 hours: Temp:  [97.9 F (36.6 C)-98.5 F (36.9 C)] 98.2 F (36.8 C) (01/11 0739) Pulse Rate:  [62-79] 79 (01/11 0619) Resp:  [14-20] 14 (01/11 0619) BP: (108-168)/(52-100) 163/81 mmHg (01/11 0619) SpO2:  [99 %-100 %] 100 % (01/11 0739) Weight:  [134 lb 7.7 oz (61 kg)] 134 lb 7.7 oz (61 kg) (01/11 0400) Last BM Date: 01/31/14  Intake/Output from previous day: 01/10 0701 - 01/11 0700 In: 246 [P.O.:240; I.V.:6] Out: -  Intake/Output this shift:    Medications Current Facility-Administered Medications  Medication Dose Route Frequency Provider Last Rate Last Dose  . 0.9 %  sodium chloride infusion  250 mL Intravenous PRN Theressa Millard, MD      . acetaminophen (TYLENOL) tablet 650 mg  650 mg Oral Q6H PRN Theressa Millard, MD   650 mg at 02/01/14 2878   Or  . acetaminophen (TYLENOL) suppository 650 mg  650 mg Rectal Q6H PRN Theressa Millard, MD      . alum & mag hydroxide-simeth (MAALOX/MYLANTA) 200-200-20 MG/5ML suspension 30 mL  30 mL Oral Q6H PRN Theressa Millard, MD   30 mL at 01/30/14 2219  . aspirin EC tablet 325 mg  325 mg Oral Daily Theressa Millard, MD   325 mg at 02/01/14 1006  . carvedilol (COREG) tablet 6.25 mg  6.25 mg Oral BID WC Jessica U Vann, DO      . enoxaparin (LOVENOX) injection 40 mg  40 mg Subcutaneous QHS Norva Riffle, RPH   40 mg at 01/31/14 2130  . HYDROmorphone (DILAUDID) injection 0.5-1 mg  0.5-1 mg Intravenous Q3H PRN Theressa Millard, MD      . insulin aspart (novoLOG) injection 0-5 Units  0-5 Units Subcutaneous QHS Theressa Millard, MD   0 Units at 01/30/14 2232  . insulin aspart (novoLOG) injection 0-9 Units  0-9 Units Subcutaneous TID WC Theressa Millard, MD   0 Units at 01/31/14 0800  . lisinopril (PRINIVIL,ZESTRIL) tablet 10 mg  10 mg Oral BID Theressa Millard, MD   10 mg at 02/01/14 6767  . nitroGLYCERIN (NITROGLYN) 2 % ointment 0.5  inch  0.5 inch Topical 4 times per day Theressa Millard, MD   0.5 inch at 02/01/14 0600  . nitroGLYCERIN (NITROSTAT) SL tablet 0.4 mg  0.4 mg Sublingual Q5 min PRN Malvin Johns, MD   0.4 mg at 01/30/14 1401  . ondansetron (ZOFRAN) tablet 4 mg  4 mg Oral Q6H PRN Theressa Millard, MD       Or  . ondansetron (ZOFRAN) injection 4 mg  4 mg Intravenous Q6H PRN Theressa Millard, MD      . oxyCODONE (Oxy IR/ROXICODONE) immediate release tablet 5 mg  5 mg Oral Q4H PRN Theressa Millard, MD   5 mg at 01/31/14 1503  . pantoprazole (PROTONIX) EC tablet 20 mg  20 mg Oral Daily Geradine Girt, DO   20 mg at 02/01/14 2094  . pravastatin (PRAVACHOL) tablet 40 mg  40 mg Oral Daily Theressa Millard, MD   40 mg at 02/01/14 1005  . sertraline (ZOLOFT) tablet 50 mg  50 mg Oral Daily Theressa Millard, MD   50 mg at 02/01/14 1005  . sodium chloride 0.9 % injection 3 mL  3 mL Intravenous Q12H Theressa Millard, MD   0 mL at 01/30/14 2200  .  sodium chloride 0.9 % injection 3 mL  3 mL Intravenous Q12H Theressa Millard, MD   3 mL at 01/31/14 2129  . sodium chloride 0.9 % injection 3 mL  3 mL Intravenous PRN Theressa Millard, MD   3 mL at 02/01/14 1009    PE: General appearance: alert and cooperative Lungs: clear to auscultation bilaterally Heart: regular rate and rhythm, S1, S2 normal, no murmur, click, rub or gallop Extremities: No LEE Pulses: 2+ and symmetric Skin: WArm and dry Neurologic: Cranial nerves: normal  Lab Results:   Recent Labs  01/30/14 1315 01/31/14 0740  WBC 6.4 4.8  HGB 15.4 14.9  HCT 43.8 42.2  PLT 210 201   BMET  Recent Labs  01/30/14 1315 01/31/14 0740  NA 132* 132*  K 3.9 4.5  CL 99 100  CO2 24 25  GLUCOSE 103* 107*  BUN 12 11  CREATININE 0.71 0.82  CALCIUM 9.2 8.7   PT/INR No results for input(s): LABPROT, INR in the last 72 hours. Cholesterol  Recent Labs  01/31/14 0138  CHOL 130   Lipid Panel     Component Value Date/Time   CHOL 130  01/31/2014 0138   TRIG 84 01/31/2014 0138   HDL 28* 01/31/2014 0138   CHOLHDL 4.6 01/31/2014 0138   VLDL 17 01/31/2014 0138   LDLCALC 85 01/31/2014 0138    Cardiac Panel (last 3 results)  Recent Labs  01/30/14 2020 01/31/14 0138 01/31/14 0740  TROPONINI <0.03 <0.03 <0.03    Assessment/Plan     Chest pain Ruled out for MI.   ASA, coreg 6.25bid.  He tolerated the lexiscan stress test very well.  Results pending.    Left sided numbness(arm/face) Code stroke was initiated. Work-up per neuro.  MRI: No acute intracranial abnormality or significant interval change    Diabetes mellitus type 2, noninsulin dependent  Per primary   Hyperlipidemia  Statin   GERD (gastroesophageal reflux disease)   Essential hypertension  BP has been well controlled until now.  Recheck.   Also on ACE   Lower abdominal pain   Unintentional weight loss   TIA (transient ischemic attack)       LOS: 2 days    HAGER, BRYAN PA-C 02/01/2014 12:48 PM  Personally seen and examined. Agree with above. Awaiting NUC stress result. He states that he has had a rough morning. CT and NUC.  Neuro Candee Furbish, MD

## 2014-02-01 NOTE — Progress Notes (Signed)
UR completed 

## 2014-02-01 NOTE — Progress Notes (Signed)
STROKE TEAM PROGRESS NOTE   HISTORY [name] is an 64 y.o. male admitted with chest pain who this morning 02/01/2014 at 0500 had the acute onset of numbness around his mouth and numbness of his left upper extremity while in the hospital (admitted yesterday for CP). There was no associated weakness of difficulty with speech. Patient made his nurse aware. Head CT was ordered. By the completion of his head CT his symptoms had resolved. Consulted to evaluate the patient. Current NIHSS of 1. Patient was not administered TPA secondary to resolution of symptoms.   SUBJECTIVE (INTERVAL HISTORY) His entire family is at the bedside.  Overall he feels his condition is gradually improving, though he is concerned about his symptoms that he had. Reports numbness around his mouth that he has been reporting to MDs for some time prior to admission.   OBJECTIVE Temp:  [97.9 F (36.6 C)-98.5 F (36.9 C)] 98.2 F (36.8 C) (01/11 0739) Pulse Rate:  [62-79] 79 (01/11 0619) Cardiac Rhythm:  [-]  Resp:  [14-20] 14 (01/11 0619) BP: (103-168)/(52-100) 163/81 mmHg (01/11 0619) SpO2:  [99 %-100 %] 100 % (01/11 0739) Weight:  [61 kg (134 lb 7.7 oz)] 61 kg (134 lb 7.7 oz) (01/11 0400)   Recent Labs Lab 01/31/14 1142 01/31/14 1643 01/31/14 2137 02/01/14 0520 02/01/14 0738  GLUCAP 120* 122* 181* 134* 164*    Recent Labs Lab 01/30/14 1315 01/31/14 0740  NA 132* 132*  K 3.9 4.5  CL 99 100  CO2 24 25  GLUCOSE 103* 107*  BUN 12 11  CREATININE 0.71 0.82  CALCIUM 9.2 8.7   No results for input(s): AST, ALT, ALKPHOS, BILITOT, PROT, ALBUMIN in the last 168 hours.  Recent Labs Lab 01/30/14 1315 01/31/14 0740  WBC 6.4 4.8  HGB 15.4 14.9  HCT 43.8 42.2  MCV 89.9 89.0  PLT 210 201    Recent Labs Lab 01/30/14 1315 01/30/14 2020 01/31/14 0138 01/31/14 0740  TROPONINI <0.03 <0.03 <0.03 <0.03   No results for input(s): LABPROT, INR in the last 72 hours. No results for input(s):  COLORURINE, LABSPEC, [place], GLUCOSEU, HGBUR, BILIRUBINUR, KETONESUR, PROTEINUR, UROBILINOGEN, NITRITE, LEUKOCYTESUR in the last 72 hours.  Invalid input(s): APPERANCEUR     Component Value Date/Time   CHOL 130 01/31/2014 0138   TRIG 84 01/31/2014 0138   HDL 28* 01/31/2014 0138   CHOLHDL 4.6 01/31/2014 0138   VLDL 17 01/31/2014 0138   LDLCALC 85 01/31/2014 0138   Lab Results  Component Value Date   HGBA1C 6.6* 01/31/2014   No results found for: LABOPIA, COCAINSCRNUR, LABBENZ, AMPHETMU, THCU, LABBARB  No results for input(s): ETH in the last 168 hours.  Dg Chest 2 View  01/30/2014   CLINICAL DATA:  One day history of chest pain.  Left arm tingling  EXAM: CHEST  2 VIEW  COMPARISON:  July 21, 2012  FINDINGS: Lungs are clear. Heart size and pulmonary vascularity are normal. No adenopathy. No pneumothorax. No bone lesions.  IMPRESSION: No edema or consolidation.   Electronically Signed   By: Lowella Grip M.D.   On: 01/30/2014 14:29   Ct Head Wo Contrast  02/01/2014   CLINICAL DATA:  Acute onset of left-sided numbness. Initial encounter.  EXAM: CT HEAD WITHOUT CONTRAST  TECHNIQUE: Contiguous axial images were obtained from the base of the skull through the vertex without intravenous contrast.  COMPARISON:  CT of the head performed 01/15/2013, and MRI of the brain performed 04/09/2013  FINDINGS: There is no  evidence of acute infarction, mass lesion, or intra- or extra-axial hemorrhage on CT.  Prominence of the sulci suggests mild cortical volume loss.  The brainstem and fourth ventricle are within normal limits. The basal ganglia are unremarkable in appearance. The cerebral hemispheres demonstrate grossly normal gray-white differentiation. No mass effect or midline shift is seen.  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 The orbits are within normal limits. The paranasal  sinuses and mastoid air cells are well-aerated. No significant soft tissue abnormalities are seen.  IMPRESSION: 1. No acute intracranial pathology seen on CT. 2. Mild cortical volume loss noted. 3. 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   Electronically Signed   By: Garald Balding M.D.   On: 02/01/2014 06:33     PHYSICAL EXAM Pleasant middle aged obese caucasian male not in distress.Awake alert. Afebrile. Head is nontraumatic. Neck is supple without bruit. Hearing is normal. Cardiac exam no murmur or gallop. Lungs are clear to auscultation. Distal pulses are well felt. Neurological Exam ;  Awake  Alert oriented x 3. Normal speech and language.eye movements full without nystagmus.fundi were not visualized. Vision acuity and fields appear normal. Hearing is normal. Palatal movements are normal. Face symmetric. Tongue midline. Normal strength, tone, reflexes and coordination. Normal sensation. Gait deferred. ASSESSMENT/PLAN [name] is a 64 y.o. male who developed left sided weakness in the hospital after being admitted for chest pain. He did not receive IV t-PA due to resolution of symptoms.   Possible right brain Stroke/TIA  Resultant  Neuro deficits resolved  MRI  pending   MRA  Not ordered, have addded  Carotid Doppler  Not ordered, have added  2D Echo  pending   Lovenox 40 mg sq daily for VTE prophylaxis  Diet NPO time specified Except for: Sips with Meds  aspirin 81 mg orally every day prior to admission, now on aspirin 325 mg orally every day  Ongoing aggressive stroke risk factor management  Therapy recommendations:  No therapy needs  Will get MRI asap, may be able to consider for  POINT Trial - POINT is a randomized, double-blind, multicenter clinical trial to determine whether clopidogrel 75mg /day (after a loading dose of 600mg ) is effective in improving survival free from major  ischemic vascular events (ischemic stroke, myocardial infarction, and ischemic vascular death) at 90 days when initiated within 12 hours time last known free of new ischemic symptoms of TIA or minor ischemic stroke in subjects receiving aspirin 50-325mg /day.   Disposition:  Return home  Hypertension  Stable  Hyperlipidemia  Home meds:  poravachol 40, resumed in hospital  LDL 85, goal < 70  Continue statin at discharge  Diabetes  HgbA1c 6.6, goal < 7.0  Controlled  Other Stroke Risk Factors  ?Family hx stroke (brother )  Other Active Problems  lower abdominal pain - this is his salient complaint right now.  unintentional weight loss (145->134lb in 6 months) but also admits to skipping meals at times.   hyponatremia   GERD  Other Pertinent History  Prostate cancer  Hospital day # [place] for Pager information 02/01/2014 9:11 AM  I have personally examined this patient, reviewed notes, independently viewed imaging studies, participated in medical decision making and plan of care. I have made any additions or  clarifications directly to the above note. Agree with note above. 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 Prior history of transient episodes of memory and naming difficulties over a year and a half may also have been TIAs. Continue stroke workup.  Antony Contras, MD Medical Director [facility] Pager: [phone] 02/01/2014 3:48 PM   To contact Stroke Continuity provider, please refer to [email or web address] After hours, contact General Neurology

## 2014-02-01 NOTE — Progress Notes (Signed)
Pt reports that he had left cheek numbness radiating to left arm / hand that start at approximately 0510.  EKG obtained with no acute changes.  BP at 168/100, NSR 80, 100% room air.  Scheduled ntg paste applied per orders.  CBG WNL.  MD notified and rapid response notified.  CT en route.

## 2014-02-01 NOTE — Consult Note (Signed)
Referring Physician: Eliseo Squires    Chief Complaint: Left sided numbness  HPI: [name] is an 64 y.o. male admitted with chest pain who this morning had the acute onset of numbness around his mouth and numbness of his left upper extremity.  There was no associated weakness of difficulty with speech.  Patient made his nurse aware.  Head CT was ordered.  By the completion of his head CT his symptoms had resolved.  Consulted to evaluate the patient.  Current NIHSS of 1.    Date last known well: Date: 02/01/2014 Time last known well: Time: 05:00 tPA Given: No: Resolution of symptoms  Past Medical History  Diagnosis Date  . 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 Hyperlipidemia   . GERD (gastroesophageal reflux disease) 01/13/2011  . Depression   . Diabetes mellitus type 2, noninsulin dependent 01/13/2011  . Essential hypertension   . Esophageal spasm   . History of cardiac cath     a. 2005: normal cors, normal EF. b. Nuc 12/2010: normal, EF 61%. 2D Echo 12/2010: EF normal (% not given), no RWMA, trivial AI, aortic root trivially dilated, mild MR.    Past Surgical History  Procedure Laterality Date  . Prostatectomy    . Cardiac catheterization  2005    No CAD    Family History  Problem Relation Age of Onset  . Colon cancer Father   . Cancer Mother   . Stroke Brother     [place] and hit head, ? stroke  . CAD Brother     Bypass in his 78's.  [name] COPD Sister   . Hypertension Sister    Social History:  reports that he has never smoked. He has never used smokeless tobacco. He reports that he does not drink alcohol or use illicit drugs.  Allergies:  Allergies  Allergen Reactions  . Iohexol Nausea And Vomiting    Vomits immediately after IV contrast    Medications:  I have reviewed the patient's current medications. Prior to Admission:  Prescriptions prior to admission  Medication Sig Dispense Refill Last Dose  . aspirin EC 81 MG tablet Take 81 mg  by mouth daily.   01/29/2014 at Unknown time  . Aspirin-Acetaminophen-Caffeine (GOODY HEADACHE PO) Take 1 packet by mouth daily as needed (headache).   week ago  . carvedilol (COREG) 6.25 MG tablet Take 6.25 mg by mouth 2 (two) times daily with a meal.   01/29/2014 at 1900  . dicyclomine (BENTYL) 20 MG tablet Take 20 mg by mouth every 6 (six) hours as needed for spasms (stomach gurgling).   0 01/29/2014 at Unknown time  . glimepiride (AMARYL) 2 MG tablet Take 2 mg by mouth 2 (two) times daily.    01/30/2014 at am  . lisinopril (PRINIVIL,ZESTRIL) 10 MG tablet Take 10 mg by mouth 2 (two) times daily.   0 01/29/2014 at Unknown time  . metFORMIN (GLUCOPHAGE) 1000 MG tablet Take 1,000 mg by mouth 2 (two) times daily with a meal.     01/30/2014 at am  . Multiple Vitamin (MULTIVITAMIN WITH MINERALS) TABS tablet Take 2 tablets by mouth daily.   01/30/2014 at Unknown time  . naproxen sodium (ALEVE) 220 MG tablet Take 440 mg by mouth 2 (two) times daily as needed (pain).   week ago  . omeprazole (PRILOSEC OTC) 20 MG tablet Take 20 mg by mouth daily.   couple days ago  . pravastatin (PRAVACHOL) 40 MG tablet Take  40 mg by mouth daily.   0 01/29/2014 at Unknown time  . sertraline (ZOLOFT) 50 MG tablet Take 50 mg by mouth daily.  0 01/29/2014 at Unknown time  . HYDROcodone-acetaminophen (NORCO/VICODIN) 5-325 MG per tablet Take 1-2 tablets by mouth every 6 hours as needed for pain. (Patient not taking: Reported on 01/30/2014) 15 tablet 0 Not Taking at Unknown time  . nortriptyline (PAMELOR) 10 MG capsule One tablet every night for one week, then 2 tablets every night (Patient not taking: Reported on 01/30/2014) 60 capsule 12 Not Taking at Unknown time   Scheduled: . aspirin EC  325 mg Oral Daily  . carvedilol  6.25 mg Oral BID WC  . enoxaparin (LOVENOX) injection  40 mg Subcutaneous QHS  . insulin aspart  0-5 Units Subcutaneous QHS  . insulin aspart  0-9 Units Subcutaneous TID WC  . lisinopril  10 mg Oral BID  . nitroGLYCERIN  0.5  inch Topical 4 times per day  . nitroGLYCERIN  1 inch Topical 4 times per day  . pantoprazole  20 mg Oral Daily  . pravastatin  40 mg Oral Daily  . sertraline  50 mg Oral Daily  . sodium chloride  3 mL Intravenous Q12H  . sodium chloride  3 mL Intravenous Q12H    ROS: History obtained from the patient  General ROS: negative for - chills, fatigue, fever, night sweats, weight gain or weight loss Psychological ROS: negative for - behavioral disorder, hallucinations, memory difficulties, mood swings or suicidal ideation Ophthalmic ROS: negative for - blurry vision, double vision, eye pain or loss of vision ENT ROS: negative for - epistaxis, nasal discharge, oral lesions, sore throat, tinnitus or vertigo Allergy and Immunology ROS: negative for - hives or itchy/watery eyes Hematological and Lymphatic ROS: negative for - bleeding problems, bruising or swollen lymph nodes Endocrine ROS: negative for - galactorrhea, hair pattern changes, polydipsia/polyuria or temperature intolerance Respiratory ROS: negative for - cough, hemoptysis, shortness of breath or wheezing Cardiovascular ROS: as noted in HPI Gastrointestinal ROS: negative for - abdominal pain, diarrhea, hematemesis, nausea/vomiting or stool incontinence Genito-Urinary ROS: negative for - dysuria, hematuria, incontinence or urinary frequency/urgency Musculoskeletal ROS: negative for - joint swelling or muscular weakness Neurological ROS: as noted in HPI Dermatological ROS: negative for rash and skin lesion changes  Physical Examination: Blood pressure 168/100, pulse 66, temperature 97.9 F (36.6 C), temperature source Oral, resp. rate 20, height 5\' 8"  (1.727 m), weight 61 kg (134 lb 7.7 oz), SpO2 100 %.  HEENT-  Normocephalic, no lesions, without obvious abnormality.  Normal external eye and conjunctiva.  Normal TM's bilaterally.  Normal auditory canals and external ears. Normal external nose, mucus membranes and septum.  Normal  pharynx. Cardiovascular- S1, S2 normal, pulses palpable throughout   Lungs- chest clear, no wheezing, rales, normal symmetric air entry Abdomen- soft, non-tender; bowel sounds normal; no masses,  no organomegaly Extremities- no edema Lymph-no adenopathy palpable Musculoskeletal-no joint tenderness, deformity or swelling Skin-warm and dry, no hyperpigmentation, vitiligo, or suspicious lesions  Neurological Examination Mental Status: Alert, oriented, thought content appropriate.  Speech fluent without evidence of aphasia.  Able to follow 3 step commands without difficulty. Cranial Nerves: II: Discs flat bilaterally; Visual fields grossly normal, pupils equal, round, reactive to light and accommodation III,IV, VI: ptosis not present, extra-ocular motions intact bilaterally V,VII: mild left facial droop, facial light touch sensation normal bilaterally VIII: hearing normal bilaterally IX,X: gag reflex present XI: bilateral shoulder shrug XII: midline tongue extension Motor: Right :  Upper extremity   5/5    Left:     Upper extremity   5/5  Lower extremity   5/5     Lower extremity   5/5 Tone and bulk:normal tone throughout; no atrophy noted Sensory: Pinprick and light touch intact throughout, bilaterally Deep Tendon Reflexes: 2+ and symmetric throughout Plantars: Right: downgoing   Left: downgoing Cerebellar: normal finger-to-nose and normal heel-to-shin testing bilaterally   Laboratory Studies:  Basic Metabolic Panel:  Recent Labs Lab 01/30/14 1315 01/31/14 0740  NA 132* 132*  K 3.9 4.5  CL 99 100  CO2 24 25  GLUCOSE 103* 107*  BUN 12 11  CREATININE 0.71 0.82  CALCIUM 9.2 8.7    Liver Function Tests: No results for input(s): AST, ALT, ALKPHOS, BILITOT, PROT, ALBUMIN in the last 168 hours. No results for input(s): LIPASE, AMYLASE in the last 168 hours. No results for input(s): AMMONIA in the last 168 hours.  CBC:  Recent Labs Lab 01/30/14 1315 01/31/14 0740  WBC  6.4 4.8  HGB 15.4 14.9  HCT 43.8 42.2  MCV 89.9 89.0  PLT 210 201    Cardiac Enzymes:  Recent Labs Lab 01/30/14 1315 01/30/14 2020 01/31/14 0138 01/31/14 0740  TROPONINI <0.03 <0.03 <0.03 <0.03    BNP: Invalid input(s): POCBNP  CBG:  Recent Labs Lab 01/31/14 0731 01/31/14 1142 01/31/14 1643 01/31/14 2137 02/01/14 0520  GLUCAP 108* 120* 122* 181* 134*    Microbiology: Results for orders placed or performed during the hospital encounter of 04/27/13  Urine culture     Status: None   Collection Time: 04/27/13  9:45 AM  Result Value Ref Range Status   Specimen Description URINE, CLEAN CATCH  Final   Special Requests NONE  Final   Culture  Setup Time   Final    04/27/2013 16:17 Performed at [place] Performed at [facility]  Final   Culture NO GROWTH Performed at [facility]  Final   Report Status 04/28/2013 FINAL  Final    Coagulation Studies: No results for input(s): LABPROT, INR in the last 72 hours.  Urinalysis: No results for input(s): COLORURINE, LABSPEC, PHURINE, GLUCOSEU, HGBUR, BILIRUBINUR, KETONESUR, PROTEINUR, UROBILINOGEN, NITRITE, LEUKOCYTESUR in the last 168 hours.  Invalid input(s): APPERANCEUR  Lipid Panel:    Component Value Date/Time   CHOL 130 01/31/2014 0138   TRIG 84 01/31/2014 0138   HDL 28* 01/31/2014 0138   CHOLHDL 4.6 01/31/2014 0138   VLDL 17 01/31/2014 0138   LDLCALC 85 01/31/2014 0138    HgbA1C:  Lab Results  Component Value Date   HGBA1C 6.6* 01/31/2014    Urine Drug Screen:  No results found for: LABOPIA, COCAINSCRNUR, LABBENZ, AMPHETMU, THCU, LABBARB  Alcohol Level: No results for input(s): ETH in the last 168 hours.  Other results: EKG: normal sinus rhythm at 85 bpm.  Imaging: Dg Chest 2 View  01/30/2014   CLINICAL DATA:  One day history of chest pain.  Left arm tingling  EXAM: CHEST  2 VIEW  COMPARISON:  July 21, 2012  FINDINGS: Lungs are clear. Heart size  and pulmonary vascularity are normal. No adenopathy. No pneumothorax. No bone lesions.  IMPRESSION: No edema or consolidation.   Electronically Signed   By: Lowella Grip M.D.   On: 01/30/2014 14:29    Assessment: 64 y.o. male admitted with chest pain who now complains of left facial and left upper extremity numbness.  On neurological examination there is  some mild left facial asymmetry but no other neurological findings.  Head CT personally reviewed and shows no acute changes. Subjective symptoms have resolved.  Can not rule out the possibility of a TIA.  Patient with multiple vascular risk factors.   Further work up recommended. A1c 6.6.  LDL 85.      Stroke Risk Factors - diabetes mellitus, hyperlipidemia and hypertension  Plan: 1. MRI, MRA  of the brain without contrast 2. Echocardiogram pending 3. Carotid dopplers 4. Prophylactic therapy-Continue ASA 5. Telemetry monitoring 6. Frequent neuro checks 7. To call if recurrent neurological symptoms   Alexis Goodell, MD [facility] [phone] 02/01/2014, 6:31 AM

## 2014-02-02 DIAGNOSIS — I369 Nonrheumatic tricuspid valve disorder, unspecified: Secondary | ICD-10-CM

## 2014-02-02 DIAGNOSIS — R200 Anesthesia of skin: Secondary | ICD-10-CM | POA: Insufficient documentation

## 2014-02-02 DIAGNOSIS — R208 Other disturbances of skin sensation: Secondary | ICD-10-CM

## 2014-02-02 LAB — GLUCOSE, CAPILLARY
GLUCOSE-CAPILLARY: 160 mg/dL — AB (ref 70–99)
Glucose-Capillary: 153 mg/dL — ABNORMAL HIGH (ref 70–99)

## 2014-02-02 MED ORDER — CLOPIDOGREL BISULFATE 75 MG PO TABS: 75.0000 mg | ORAL_TABLET | Freq: Every day | ORAL | Status: DC

## 2014-02-02 MED ORDER — ASPIRIN 325 MG PO TBEC: 325.0000 mg | DELAYED_RELEASE_TABLET | Freq: Every day | ORAL | Status: DC

## 2014-02-02 MED ORDER — ENSURE COMPLETE PO LIQD: 237.0000 mL | Freq: Three times a day (TID) | ORAL | Status: DC

## 2014-02-02 MED ORDER — CARVEDILOL 12.5 MG PO TABS: 12.5000 mg | ORAL_TABLET | Freq: Two times a day (BID) | ORAL | Status: DC

## 2014-02-02 NOTE — Progress Notes (Signed)
VASCULAR LAB PRELIMINARY  PRELIMINARY  PRELIMINARY  PRELIMINARY  Carotid duplex completed.    Preliminary report:  Bilateral:  1-39% ICA stenosis.  Vertebral artery flow is antegrade.     [name], RVS 02/02/2014, 9:01 AM

## 2014-02-02 NOTE — Progress Notes (Addendum)
Subjective: No CP.  Feels better now than earlier  Objective: Vital signs in last 24 hours: Temp:  [97.5 F (36.4 C)-99 F (37.2 C)] 97.7 F (36.5 C) (01/12 0405) Pulse Rate:  [70-94] 70 (01/12 0005) Resp:  [12-16] 16 (01/12 0405) BP: (99-180)/(63-96) 102/63 mmHg (01/12 0405) SpO2:  [98 %-100 %] 100 % (01/12 0405) Weight:  [134 lb 4.8 oz (60.918 kg)] 134 lb 4.8 oz (60.918 kg) (01/12 0405) Last BM Date: 02/01/14  Intake/Output from previous day: 01/11 0701 - 01/12 0700 In: 600 [P.O.:600] Out: 250 [Urine:250] Intake/Output this shift:    Medications Current Facility-Administered Medications  Medication Dose Route Frequency Provider Last Rate Last Dose  . 0.9 %  sodium chloride infusion  250 mL Intravenous PRN Theressa Millard, MD      . acetaminophen (TYLENOL) tablet 650 mg  650 mg Oral Q6H PRN Theressa Millard, MD   650 mg at 02/01/14 9562   Or  . acetaminophen (TYLENOL) suppository 650 mg  650 mg Rectal Q6H PRN Theressa Millard, MD      . alum & mag hydroxide-simeth (MAALOX/MYLANTA) 200-200-20 MG/5ML suspension 30 mL  30 mL Oral Q6H PRN Theressa Millard, MD   30 mL at 01/30/14 2219  . aspirin EC tablet 325 mg  325 mg Oral Daily Theressa Millard, MD   325 mg at 02/02/14 0910  . carvedilol (COREG) tablet 12.5 mg  12.5 mg Oral BID WC Geradine Girt, DO   12.5 mg at 02/02/14 0910  . enoxaparin (LOVENOX) injection 40 mg  40 mg Subcutaneous QHS Norva Riffle, RPH   40 mg at 02/01/14 2143  . hydrALAZINE (APRESOLINE) injection 10 mg  10 mg Intravenous Q6H PRN Geradine Girt, DO      . HYDROmorphone (DILAUDID) injection 0.5-1 mg  0.5-1 mg Intravenous Q3H PRN Theressa Millard, MD      . insulin aspart (novoLOG) injection 0-5 Units  0-5 Units Subcutaneous QHS Theressa Millard, MD   2 Units at 02/01/14 2144  . insulin aspart (novoLOG) injection 0-9 Units  0-9 Units Subcutaneous TID WC Theressa Millard, MD   2 Units at 02/02/14 [phone]  . lisinopril (PRINIVIL,ZESTRIL)  tablet 20 mg  20 mg Oral BID Geradine Girt, DO   20 mg at 02/02/14 0910  . nitroGLYCERIN (NITROGLYN) 2 % ointment 0.5 inch  0.5 inch Topical 4 times per day Theressa Millard, MD   0.5 inch at 02/01/14 1744  . nitroGLYCERIN (NITROSTAT) SL tablet 0.4 mg  0.4 mg Sublingual Q5 min PRN Malvin Johns, MD   0.4 mg at 01/30/14 1401  . ondansetron (ZOFRAN) tablet 4 mg  4 mg Oral Q6H PRN Theressa Millard, MD       Or  . ondansetron (ZOFRAN) injection 4 mg  4 mg Intravenous Q6H PRN Theressa Millard, MD      . oxyCODONE (Oxy IR/ROXICODONE) immediate release tablet 5 mg  5 mg Oral Q4H PRN Theressa Millard, MD   5 mg at 01/31/14 1503  . pantoprazole (PROTONIX) EC tablet 20 mg  20 mg Oral Daily Geradine Girt, DO   20 mg at 02/01/14 6578  . pravastatin (PRAVACHOL) tablet 40 mg  40 mg Oral Daily Theressa Millard, MD   40 mg at 02/02/14 0910  . sertraline (ZOLOFT) tablet 50 mg  50 mg Oral Daily Theressa Millard, MD   50 mg at 02/02/14 0910  . sodium chloride  0.9 % injection 3 mL  3 mL Intravenous Q12H Theressa Millard, MD   3 mL at 02/02/14 0911  . sodium chloride 0.9 % injection 3 mL  3 mL Intravenous Q12H Theressa Millard, MD   3 mL at 02/02/14 0911  . sodium chloride 0.9 % injection 3 mL  3 mL Intravenous PRN Theressa Millard, MD   3 mL at 02/01/14 1009    PE: General appearance: alert and cooperative Lungs: clear to auscultation bilaterally Heart: regular rate and rhythm, S1, S2 normal, no murmur, click, rub or gallop Extremities: No LEE Pulses: 2+ and symmetric Skin: WArm and dry Neurologic: Cranial nerves: normal  Lab Results:   Recent Labs  01/30/14 1315 01/31/14 0740  WBC 6.4 4.8  HGB 15.4 14.9  HCT 43.8 42.2  PLT 210 201   BMET  Recent Labs  01/30/14 1315 01/31/14 0740  NA 132* 132*  K 3.9 4.5  CL 99 100  CO2 24 25  GLUCOSE 103* 107*  BUN 12 11  CREATININE 0.71 0.82  CALCIUM 9.2 8.7   PT/INR No results for input(s): LABPROT, INR in the last 72  hours. Cholesterol  Recent Labs  01/31/14 0138  CHOL 130   Lipid Panel     Component Value Date/Time   CHOL 130 01/31/2014 0138   TRIG 84 01/31/2014 0138   HDL 28* 01/31/2014 0138   CHOLHDL 4.6 01/31/2014 0138   VLDL 17 01/31/2014 0138   LDLCALC 85 01/31/2014 0138    Cardiac Panel (last 3 results)  Recent Labs  01/30/14 2020 01/31/14 0138 01/31/14 0740  TROPONINI <0.03 <0.03 <0.03    Assessment/Plan     Chest pain  NUC stress low risk. Normal EF.  ECHO normal EF 65% Ruled out for MI.   ASA, coreg 6.25bid.     Left sided numbness(arm/face) Code stroke was initiated. Work-up per neuro.  MRI: No acute intracranial abnormality or significant interval change    Diabetes mellitus type 2, noninsulin dependent  Per primary    Hyperlipidemia  Statin    GERD (gastroesophageal reflux disease)    Essential hypertension  BP has been well controlled     Lower abdominal pain   Unintentional weight loss   TIA (transient ischemic attack)   Per primary team    Will sign off. Reassuring workup.  No need for cardiology follow up.  Worked at [employer] for years   LOS: 3 days    Candee Furbish MD 02/02/2014 12:00 PM

## 2014-02-02 NOTE — Progress Notes (Signed)
  Echocardiogram 2D Echocardiogram has been performed.  [name] FRANCES 02/02/2014, 8:58 AM

## 2014-02-02 NOTE — Discharge Summary (Signed)
Physician Discharge Summary  [name]:[id number] DOB: 10-02-1950 DOA: 01/30/2014  PCP: Cari Caraway, MD  Admit date: 01/30/2014 Discharge date: 02/02/2014  Time spent: 35 minutes  Recommendations for Outpatient Follow-up:  1. GI eval as outaptient  Discharge Diagnoses:  Principal Problem:   Chest pain Active Problems:   Diabetes mellitus type 2, noninsulin dependent   Hyperlipidemia   GERD (gastroesophageal reflux disease)   Essential hypertension   Lower abdominal pain   Unintentional weight loss   Left sided numbness   TIA (transient ischemic attack)   Left-sided weakness   Pain in the chest   Left facial numbness   Discharge Condition: improved  Diet recommendation: cardiac/diabetic  Filed Weights   01/31/14 0400 02/01/14 0400 02/02/14 0405  Weight: 60.963 kg (134 lb 6.4 oz) 61 kg (134 lb 7.7 oz) 60.918 kg (134 lb 4.8 oz)    History of present illness:  [name] is a 64 y.o. male with a history of DM2, HTN, and Hyperlipidemia who presented to the [facility] ED with complaints of Nagging, constant chest pain since the AM. He rated the pain at a 2/10 located in the mid and left chest area. He denies any SOB, nausea or vomiting or diaphoresis. His initial workup has been negative and he was referred for further evaluation . He reports having a negative Stress Test 3 years ago.  Hospital Course:  Chest pain NUC stress low risk. Normal EF. ECHO normal EF 65%    Left sided numbness(arm/face)  MRI: No acute intracranial abnormality or significant interval change Echo/carotid   Diabetes mellitus type 2, noninsulin dependent Per primary   Hyperlipidemia Statin   GERD (gastroesophageal reflux disease)   Essential hypertension BP has been well controlled    Lower abdominal pain  Unintentional weight loss- GI work up as outpatient   TIA (transient ischemic  attack) ASA  Procedures:    Consultations:  Cards  neuro  Discharge Exam: Filed Vitals:   02/02/14 0405  BP: 102/63  Pulse:   Temp: 97.7 F (36.5 C)  Resp: 16    General: A+Ox3, NAD Cardiovascular: rrr Respiratory: clear  Discharge Instructions   Discharge Instructions    Diet - low sodium heart healthy    Complete by:  As directed      Diet Carb Modified    Complete by:  As directed      Discharge instructions    Complete by:  As directed   Need GI follow up (Megod) for possible UGD PCP 1 week for BP check     Increase activity slowly    Complete by:  As directed           Current Discharge Medication List    CONTINUE these medications which have CHANGED   Details  aspirin EC 325 MG EC tablet Take 1 tablet (325 mg total) by mouth daily. Qty: 30 tablet, Refills: 0    carvedilol (COREG) 12.5 MG tablet Take 1 tablet (12.5 mg total) by mouth 2 (two) times daily with a meal. Qty: 60 tablet, Refills: 0      CONTINUE these medications which have NOT CHANGED   Details  dicyclomine (BENTYL) 20 MG tablet Take 20 mg by mouth every 6 (six) hours as needed for spasms (stomach gurgling).  Refills: 0    glimepiride (AMARYL) 2 MG tablet Take 2 mg by mouth 2 (two) times daily.     lisinopril (PRINIVIL,ZESTRIL) 10 MG tablet Take 10 mg by mouth 2 (two) times daily.  Refills: 0    metFORMIN (GLUCOPHAGE) 1000 MG tablet Take 1,000 mg by mouth 2 (two) times daily with a meal.      Multiple Vitamin (MULTIVITAMIN WITH MINERALS) TABS tablet Take 2 tablets by mouth daily.    omeprazole (PRILOSEC OTC) 20 MG tablet Take 20 mg by mouth daily.    pravastatin (PRAVACHOL) 40 MG tablet Take 40 mg by mouth daily.  Refills: 0    sertraline (ZOLOFT) 50 MG tablet Take 50 mg by mouth daily. Refills: 0      STOP taking these medications     Aspirin-Acetaminophen-Caffeine (GOODY HEADACHE PO)      naproxen sodium (ALEVE) 220 MG tablet       HYDROcodone-acetaminophen (NORCO/VICODIN) 5-325 MG per tablet      nortriptyline (PAMELOR) 10 MG capsule        Allergies  Allergen Reactions  . Iohexol Nausea And Vomiting    Vomits immediately after IV contrast   Follow-up Information    Follow up with MCNEILL,WENDY, MD In 1 week.   Specialty:  Family Medicine   Contact information:   [place] [address] [phone]       Follow up with [facility], MD.   Specialty:  Gastroenterology   Why:  for EGD   Contact information:   [address], [address] [place] [address] [phone]       Follow up with SETHI,PRAMOD, MD In 2 months.   Specialties:  Neurology, Radiology   Contact information:   [address] [place] [address] [phone]        The results of significant diagnostics from this hospitalization (including imaging, microbiology, ancillary and laboratory) are listed below for reference.    Significant Diagnostic Studies: Dg Chest 2 View  01/30/2014   CLINICAL DATA:  One day history of chest pain.  Left arm tingling  EXAM: CHEST  2 VIEW  COMPARISON:  July 21, 2012  FINDINGS: Lungs are clear. Heart size and pulmonary vascularity are normal. No adenopathy. No pneumothorax. No bone lesions.  IMPRESSION: No edema or consolidation.   Electronically Signed   By: Lowella Grip M.D.   On: 01/30/2014 14:29   Ct Head Wo Contrast  02/01/2014   CLINICAL DATA:  Acute onset of left-sided numbness. Initial encounter.  EXAM: CT HEAD WITHOUT CONTRAST  TECHNIQUE: Contiguous axial images were obtained from the base of the skull through the vertex without intravenous contrast.  COMPARISON:  CT of the head performed 01/15/2013, and MRI of the brain performed 04/09/2013  FINDINGS: There is no evidence of acute infarction, mass lesion, or intra- or extra-axial hemorrhage on CT.  Prominence of the sulci suggests mild cortical volume loss.  The brainstem and fourth ventricle are within  normal limits. The basal ganglia are unremarkable in appearance. The cerebral hemispheres demonstrate grossly normal gray-white differentiation. No mass effect or midline shift is seen.  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 The orbits are within normal limits. The paranasal sinuses and mastoid air cells are well-aerated. No significant soft tissue abnormalities are seen.  IMPRESSION: 1. No acute intracranial pathology seen on CT. 2. Mild cortical volume loss noted. 3. 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   Electronically Signed   By: Garald Balding M.D.   On: 02/01/2014  06:33   [name] Head Wo Contrast  02/01/2014   CLINICAL DATA:  Left-sided weakness. Chest pain with acute onset of numbness and is mouth and numbness in the left upper extremity.  EXAM: MRI HEAD WITHOUT CONTRAST  MRA HEAD WITHOUT CONTRAST  TECHNIQUE: Multiplanar, multiecho pulse sequences of the brain and surrounding structures were obtained without intravenous contrast. Angiographic images of the head were obtained using MRA technique without contrast.  COMPARISON:  CT head without contrast 02/01/2014  FINDINGS: MRI HEAD FINDINGS  Chronic changes in the anterior clivus are stable. The cerebellar tonsils are again noted to slightly extend below the foramen magnum. The posterior fossa is otherwise unremarkable. Midline structures are otherwise within normal limits.  The diffusion-weighted images demonstrate no evidence for acute or subacute infarction. Scattered periventricular and subcortical T2 hyperintensities bilaterally are similar to prior MRI.  No acute hemorrhage or mass lesion is present. The ventricles are proportionate to the degree of atrophy. No significant extra-axial fluid collection is present.  Flow is present in the major  intracranial arteries. The a scratch the bilateral lens replacements are noted. 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 There is no significant interval change. No focal mucosal thickening or fluid levels are present.  MRA HEAD FINDINGS  The internal carotid arteries are within normal limits from the high cervical segments through the ICA termini. 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 A small posterior communicating artery is visible, suggesting this is an infundibulum. The A1 and M1 segments are normal. The anterior communicating artery is patent. The ACA and MCA branch vessels are within normal limits.  The left vertebral artery is slightly dominant to the right. The PICA origins are visualized and normal bilaterally. A prominent left AICA is noted. The basilar artery is normal. Both posterior cerebral arteries originate from the basilar tip. The PCA branch vessels are normal.  IMPRESSION: 1. 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 2. Scattered subcortical T2 hyperintensities bilaterally are stable. 3. No acute intracranial abnormality or significant interval change. 4. 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 5. No other significant proximal stenosis, aneurysm, or branch vessel occlusion within the circle of Willis.   Electronically Signed   By: Lawrence Santiago M.D.   On: 02/01/2014 12:30   Mr Brain Wo Contrast  02/01/2014   CLINICAL DATA:  Left-sided weakness. Chest pain with acute onset of numbness and is mouth and numbness in the left upper extremity.  EXAM: MRI HEAD WITHOUT CONTRAST  MRA HEAD WITHOUT CONTRAST  TECHNIQUE: Multiplanar, multiecho pulse sequences of the brain and surrounding structures were obtained without intravenous contrast. Angiographic images of the head were obtained using MRA technique  without contrast.  COMPARISON:  CT head without contrast 02/01/2014  FINDINGS: MRI HEAD FINDINGS  Chronic changes in the anterior clivus are stable. The cerebellar tonsils are again noted to slightly extend below the foramen magnum. The posterior fossa is otherwise unremarkable. Midline structures are otherwise within normal limits.  The diffusion-weighted images demonstrate no evidence for acute or subacute infarction. Scattered periventricular and subcortical T2 hyperintensities bilaterally are similar to prior MRI.  No acute hemorrhage or mass lesion is present. The ventricles are proportionate to the degree of atrophy. No significant extra-axial fluid collection is present.  Flow is present in the major intracranial arteries. The a scratch the bilateral lens replacements are noted.  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 There is no significant interval change. No focal mucosal thickening or fluid levels are present.  MRA HEAD FINDINGS  The internal carotid arteries are within normal limits from the high cervical segments through the ICA termini. 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 A small posterior communicating artery is visible, suggesting this is an infundibulum. The A1 and M1 segments are normal. The anterior communicating artery is patent. The ACA and MCA branch vessels are within normal limits.  The left vertebral artery is slightly dominant to the right. The PICA origins are visualized and normal bilaterally. A prominent left AICA is noted. The basilar artery is normal. Both posterior cerebral arteries originate from the basilar tip. The PCA branch vessels are normal.  IMPRESSION: 1. 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 2. Scattered subcortical T2 hyperintensities bilaterally are stable. 3. No acute intracranial  abnormality or significant interval change. 4. 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 5. No other significant proximal stenosis, aneurysm, or branch vessel occlusion within the circle of Willis.   Electronically Signed   By: Lawrence Santiago M.D.   On: 02/01/2014 12:30   Nm Myocar Multi W/spect W/wall Motion / Ef  02/01/2014   CLINICAL DATA:  Chest pain.  Unspecified chest pain.  EXAM: MYOCARDIAL IMAGING WITH SPECT (REST AND PHARMACOLOGIC-STRESS)  GATED LEFT VENTRICULAR WALL MOTION STUDY  LEFT VENTRICULAR EJECTION FRACTION  TECHNIQUE: Standard myocardial SPECT imaging was performed after resting intravenous injection of 10 mCi Tc-53m sestamibi. Subsequently, intravenous infusion of Lexiscan was performed under the supervision of the Cardiology staff. At peak effect of the drug, 30 mCi Tc-6m sestamibi was injected intravenously and standard myocardial SPECT imaging was performed. Quantitative gated imaging was also performed to evaluate left ventricular wall motion, and estimate left ventricular ejection fraction.  COMPARISON:  None.  FINDINGS: Perfusion: No decreased activity in the left ventricle on stress imaging to suggest reversible ischemia or infarction. Diaphragmatic attenuation over the inferior wall.  Wall Motion: Normal left ventricular wall motion. No left ventricular dilation.  Left Ventricular Ejection Fraction: 71 %  End diastolic volume 49 ml  End systolic volume 14 ml  IMPRESSION: 1. No reversible ischemia or infarction.  2. Normal left ventricular wall motion.  3. Left ventricular ejection fraction 71%. Previously ejection fraction was 61% compatible with no interval change.  4. Low-risk stress test findings*.  *2012 Appropriate Use Criteria for Coronary Revascularization Focused Update: J Am Coll Cardiol. 6063;01(6):010-932. http://content.airportbarriers.com.aspx?articleid=[email or web address]   Electronically Signed   By: Dereck Ligas M.D.   On: 02/01/2014 16:04     Microbiology: No results found for this or any previous visit (from the past 240 hour(s)).   Labs: Basic Metabolic Panel:  Recent Labs Lab 01/30/14 1315 01/31/14 0740  NA 132* 132*  K 3.9 4.5  CL 99 100  CO2 24 25  GLUCOSE 103* 107*  BUN 12 11  CREATININE 0.71 0.82  CALCIUM 9.2 8.7   Liver Function Tests: No results for input(s): AST, ALT, ALKPHOS, BILITOT, PROT, ALBUMIN in the last 168 hours. No results for input(s): LIPASE, AMYLASE in the last 168 hours. No results for input(s): AMMONIA in the last 168 hours. CBC:  Recent Labs Lab 01/30/14 1315 01/31/14 0740  WBC 6.4 4.8  HGB 15.4 14.9  HCT 43.8 42.2  MCV 89.9 89.0  PLT 210  201   Cardiac Enzymes:  Recent Labs Lab 01/30/14 1315 01/30/14 2020 01/31/14 0138 01/31/14 0740  TROPONINI <0.03 <0.03 <0.03 <0.03   BNP: BNP (last 3 results) No results for input(s): PROBNP in the last 8760 hours. CBG:  Recent Labs Lab 02/01/14 1234 02/01/14 1627 02/01/14 2104 02/02/14 0735 02/02/14 1128  GLUCAP 129* 186* 218* 153* 160*       Signed:  Alexica Schlossberg  [facility] 02/02/2014, 2:07 PM

## 2014-02-02 NOTE — Progress Notes (Signed)
STROKE TEAM PROGRESS NOTE   HISTORY [name] is an 64 y.o. male admitted with chest pain who this morning 02/01/2014 at 0500 had the acute onset of numbness around his mouth and numbness of his left upper extremity while in the hospital (admitted yesterday for CP). There was no associated weakness of difficulty with speech. Patient made his nurse aware. Head CT was ordered. By the completion of his head CT his symptoms had resolved. Consulted to evaluate the patient. Current NIHSS of 1. Patient was not administered TPA secondary to resolution of symptoms.   SUBJECTIVE (INTERVAL HISTORY) His   family is not at the bedside.  Overall he feels his condition is gradually improving,   OBJECTIVE Temp:  [97.5 F (36.4 C)-99 F (37.2 C)] 97.7 F (36.5 C) (01/12 0405) Pulse Rate:  [70-94] 70 (01/12 0005) Cardiac Rhythm:  [-]  Resp:  [12-16] 16 (01/12 0405) BP: (99-180)/(63-96) 102/63 mmHg (01/12 0405) SpO2:  [98 %-100 %] 100 % (01/12 0405) Weight:  [134 lb 4.8 oz (60.918 kg)] 134 lb 4.8 oz (60.918 kg) (01/12 0405)   Recent Labs Lab 02/01/14 1234 02/01/14 1627 02/01/14 2104 02/02/14 0735 02/02/14 1128  GLUCAP 129* 186* 218* 153* 160*    Recent Labs Lab 01/30/14 1315 01/31/14 0740  NA 132* 132*  K 3.9 4.5  CL 99 100  CO2 24 25  GLUCOSE 103* 107*  BUN 12 11  CREATININE 0.71 0.82  CALCIUM 9.2 8.7   No results for input(s): AST, ALT, ALKPHOS, BILITOT, PROT, ALBUMIN in the last 168 hours.  Recent Labs Lab 01/30/14 1315 01/31/14 0740  WBC 6.4 4.8  HGB 15.4 14.9  HCT 43.8 42.2  MCV 89.9 89.0  PLT 210 201    Recent Labs Lab 01/30/14 1315 01/30/14 2020 01/31/14 0138 01/31/14 0740  TROPONINI <0.03 <0.03 <0.03 <0.03   No results for input(s): LABPROT, INR in the last 72 hours. No results for input(s): COLORURINE, LABSPEC, [place], GLUCOSEU, HGBUR, BILIRUBINUR, KETONESUR, PROTEINUR, UROBILINOGEN, NITRITE, LEUKOCYTESUR in the last 72 hours.  Invalid input(s):  APPERANCEUR     Component Value Date/Time   CHOL 130 01/31/2014 0138   TRIG 84 01/31/2014 0138   HDL 28* 01/31/2014 0138   CHOLHDL 4.6 01/31/2014 0138   VLDL 17 01/31/2014 0138   LDLCALC 85 01/31/2014 0138   Lab Results  Component Value Date   HGBA1C 6.6* 01/31/2014   No results found for: LABOPIA, COCAINSCRNUR, LABBENZ, AMPHETMU, THCU, LABBARB  No results for input(s): ETH in the last 168 hours.  Ct Head Wo Contrast  02/01/2014   CLINICAL DATA:  Acute onset of left-sided numbness. Initial encounter.  EXAM: CT HEAD WITHOUT CONTRAST  TECHNIQUE: Contiguous axial images were obtained from the base of the skull through the vertex without intravenous contrast.  COMPARISON:  CT of the head performed 01/15/2013, and MRI of the brain performed 04/09/2013  FINDINGS: There is no evidence of acute infarction, mass lesion, or intra- or extra-axial hemorrhage on CT.  Prominence of the sulci suggests mild cortical volume loss.  The brainstem and fourth ventricle are within normal limits. The basal ganglia are unremarkable in appearance. The cerebral hemispheres demonstrate grossly normal gray-white differentiation. No mass effect or midline shift is seen.  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 The orbits are within normal limits. The paranasal sinuses and mastoid air cells are well-aerated. No significant soft tissue  abnormalities are seen.  IMPRESSION: 1. No acute intracranial pathology seen on CT. 2. Mild cortical volume loss noted. 3. 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   Electronically Signed   By: Garald Balding M.D.   On: 02/01/2014 06:33   [name] Head Wo Contrast  02/01/2014   CLINICAL DATA:  Left-sided weakness. Chest pain with acute onset of numbness and is mouth and numbness in the left upper  extremity.  EXAM: MRI HEAD WITHOUT CONTRAST  MRA HEAD WITHOUT CONTRAST  TECHNIQUE: Multiplanar, multiecho pulse sequences of the brain and surrounding structures were obtained without intravenous contrast. Angiographic images of the head were obtained using MRA technique without contrast.  COMPARISON:  CT head without contrast 02/01/2014  FINDINGS: MRI HEAD FINDINGS  Chronic changes in the anterior clivus are stable. The cerebellar tonsils are again noted to slightly extend below the foramen magnum. The posterior fossa is otherwise unremarkable. Midline structures are otherwise within normal limits.  The diffusion-weighted images demonstrate no evidence for acute or subacute infarction. Scattered periventricular and subcortical T2 hyperintensities bilaterally are similar to prior MRI.  No acute hemorrhage or mass lesion is present. The ventricles are proportionate to the degree of atrophy. No significant extra-axial fluid collection is present.  Flow is present in the major intracranial arteries. The a scratch the bilateral lens replacements are noted. 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 There is no significant interval change. No focal mucosal thickening or fluid levels are present.  MRA HEAD FINDINGS  The internal carotid arteries are within normal limits from the high cervical segments through the ICA termini. 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 A small posterior communicating artery is visible, suggesting this is an infundibulum. The A1 and M1 segments are normal. The anterior communicating artery is patent. The ACA and MCA branch vessels are within normal limits.  The left vertebral artery is slightly dominant to the right. The PICA origins are visualized and normal bilaterally. A prominent left AICA is noted. The basilar artery is normal. Both posterior cerebral  arteries originate from the basilar tip. The PCA branch vessels are normal.  IMPRESSION: 1. 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 2. Scattered subcortical T2 hyperintensities bilaterally are stable. 3. No acute intracranial abnormality or significant interval change. 4. 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 5. No other significant proximal stenosis, aneurysm, or branch vessel occlusion within the circle of Willis.   Electronically Signed   By: Lawrence Santiago M.D.   On: 02/01/2014 12:30   Mr Brain Wo Contrast  02/01/2014   CLINICAL DATA:  Left-sided weakness. Chest pain with acute onset of numbness and is mouth and numbness in the left upper extremity.  EXAM: MRI HEAD WITHOUT CONTRAST  MRA HEAD WITHOUT CONTRAST  TECHNIQUE: Multiplanar, multiecho pulse sequences of the brain and surrounding structures were obtained without intravenous contrast. Angiographic images of the head were obtained using MRA technique without contrast.  COMPARISON:  CT head without contrast 02/01/2014  FINDINGS: MRI HEAD FINDINGS  Chronic changes in the anterior clivus are stable. The cerebellar tonsils are again noted to slightly extend below the foramen magnum. The posterior fossa is otherwise unremarkable. Midline structures are otherwise within normal limits.  The diffusion-weighted images demonstrate no evidence for acute or subacute infarction. Scattered periventricular  and subcortical T2 hyperintensities bilaterally are similar to prior MRI.  No acute hemorrhage or mass lesion is present. The ventricles are proportionate to the degree of atrophy. No significant extra-axial fluid collection is present.  Flow is present in the major intracranial arteries. The a scratch the bilateral lens replacements are noted. 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 There is no significant interval change. No focal mucosal  thickening or fluid levels are present.  MRA HEAD FINDINGS  The internal carotid arteries are within normal limits from the high cervical segments through the ICA termini. 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 A small posterior communicating artery is visible, suggesting this is an infundibulum. The A1 and M1 segments are normal. The anterior communicating artery is patent. The ACA and MCA branch vessels are within normal limits.  The left vertebral artery is slightly dominant to the right. The PICA origins are visualized and normal bilaterally. A prominent left AICA is noted. The basilar artery is normal. Both posterior cerebral arteries originate from the basilar tip. The PCA branch vessels are normal.  IMPRESSION: 1. 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 2. Scattered subcortical T2 hyperintensities bilaterally are stable. 3. No acute intracranial abnormality or significant interval change. 4. 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 5. No other significant proximal stenosis, aneurysm, or branch vessel occlusion within the circle of Willis.   Electronically Signed   By: Lawrence Santiago M.D.   On: 02/01/2014 12:30   Nm Myocar Multi W/spect W/wall Motion / Ef  02/01/2014   CLINICAL DATA:  Chest pain.  Unspecified chest pain.  EXAM: MYOCARDIAL IMAGING WITH SPECT (REST AND PHARMACOLOGIC-STRESS)  GATED LEFT VENTRICULAR WALL MOTION STUDY  LEFT VENTRICULAR EJECTION FRACTION  TECHNIQUE: Standard myocardial SPECT imaging was performed after resting intravenous injection of 10 mCi Tc-85m sestamibi. Subsequently, intravenous infusion of Lexiscan was performed under the supervision of the Cardiology staff. At peak effect of the drug, 30 mCi Tc-74m sestamibi was injected intravenously and standard myocardial SPECT imaging was performed. Quantitative gated imaging was also  performed to evaluate left ventricular wall motion, and estimate left ventricular ejection fraction.  COMPARISON:  None.  FINDINGS: Perfusion: No decreased activity in the left ventricle on stress imaging to suggest reversible ischemia or infarction. Diaphragmatic attenuation over the inferior wall.  Wall Motion: Normal left ventricular wall motion. No left ventricular dilation.  Left Ventricular Ejection Fraction: 71 %  End diastolic volume 49 ml  End systolic volume 14 ml  IMPRESSION: 1. No reversible ischemia or infarction.  2. Normal left ventricular wall motion.  3. Left ventricular ejection fraction 71%. Previously ejection fraction was 61% compatible with no interval change.  4. Low-risk stress test findings*.  *2012 Appropriate Use Criteria for Coronary Revascularization Focused Update: J Am Coll Cardiol. 6283;66(2):947-654. http://content.airportbarriers.com.aspx?articleid=[email or web address]   Electronically Signed   By: Dereck Ligas M.D.   On: 02/01/2014 16:04     PHYSICAL EXAM Pleasant middle aged obese caucasian male not in distress.Awake alert. Afebrile. Head is nontraumatic. Neck is supple without bruit. Hearing is normal. Cardiac exam no murmur or gallop. Lungs are clear to auscultation. Distal pulses are well felt. Neurological Exam ;  Awake  Alert oriented x 3. Normal speech and language.eye movements full without nystagmus.fundi were not visualized. Vision acuity and fields appear normal. Hearing is normal. Palatal movements are normal. Face symmetric.  Tongue midline. Normal strength, tone, reflexes and coordination. Normal sensation. Gait deferred. ASSESSMENT/PLAN [name] is a 64 y.o. male who developed left sided weakness in the hospital after being admitted for chest pain. He did not receive IV t-PA due to resolution of symptoms.   Possible right brain Stroke/TIA  Resultant  Neuro deficits resolved  MRI  No acute infarct. Stable mild tonsillar ectopia MRA  1 mm  infundibulum of the right posterior communicating artery. 5. No other significant proximal stenosis, aneurysm, or branch  vessel occlusion within the circle of Willis  Carotid Doppler  Bilateral - 1% to 39% ICA stenosis. Vertebral artery flow is antegrade.  2D Echo  Left ventricle: The cavity size was normal. Systolic function was normal. The estimated ejection fraction was in the range of 60% to 65%. Wall motion was normal; there were no regional wall motion abnormalities.  Lovenox 40 mg sq daily for VTE prophylaxis Diet Heart Diet - low sodium heart healthy Diet Carb Modified  aspirin 81 mg orally every day prior to admission, now on aspirin 325 mg orally every day  Ongoing aggressive stroke risk factor management  Therapy recommendations:  No therapy needs   Disposition:  Return home  Hypertension  Stable  Hyperlipidemia  Home meds:  poravachol 40, resumed in hospital  LDL 85, goal < 70  Continue statin at discharge  Diabetes  HgbA1c 6.6, goal < 7.0  Controlled  Other Stroke Risk Factors  ?Family hx stroke (brother )  Other Active Problems  lower abdominal pain - this is his salient complaint right now.  unintentional weight loss (145->134lb in 6 months) but also admits to skipping meals at times.   hyponatremia   GERD  Other Pertinent History  Prostate cancer  Hospital day # [place] for Pager information 02/02/2014 1:17 PM   Continue plavix for stroke prevention and aggressive risk factor modification. Follow up as outpatient in 2 months. Stroke team will sign off. Call for questions.Antony Contras, MD Medical Director [facility] Pager: [phone] 02/02/2014 1:17 PM   To contact Stroke Continuity provider, please refer to [email or web address] After hours, contact General Neurology

## 2014-03-08 ENCOUNTER — Other Ambulatory Visit: Payer: Self-pay | Admitting: Gastroenterology

## 2014-05-12 ENCOUNTER — Encounter (HOSPITAL_COMMUNITY): Payer: Self-pay

## 2014-05-12 ENCOUNTER — Emergency Department (HOSPITAL_COMMUNITY): Payer: Medicare Other

## 2014-05-12 ENCOUNTER — Emergency Department (HOSPITAL_COMMUNITY)
Admission: EM | Admit: 2014-05-12 | Discharge: 2014-05-12 | Disposition: A | Discharge status: Home / Self Care | Payer: Medicare Other | Attending: Emergency Medicine | Admitting: Emergency Medicine

## 2014-05-12 DIAGNOSIS — Z8546 Personal history of malignant neoplasm of prostate: Secondary | ICD-10-CM | POA: Insufficient documentation

## 2014-05-12 DIAGNOSIS — F329 Major depressive disorder, single episode, unspecified: Secondary | ICD-10-CM | POA: Diagnosis not present

## 2014-05-12 DIAGNOSIS — E860 Dehydration: Secondary | ICD-10-CM

## 2014-05-12 DIAGNOSIS — E1165 Type 2 diabetes mellitus with hyperglycemia: Secondary | ICD-10-CM | POA: Insufficient documentation

## 2014-05-12 DIAGNOSIS — Z7982 Long term (current) use of aspirin: Secondary | ICD-10-CM | POA: Diagnosis not present

## 2014-05-12 DIAGNOSIS — R5381 Other malaise: Secondary | ICD-10-CM

## 2014-05-12 DIAGNOSIS — E785 Hyperlipidemia, unspecified: Secondary | ICD-10-CM | POA: Insufficient documentation

## 2014-05-12 DIAGNOSIS — I10 Essential (primary) hypertension: Secondary | ICD-10-CM | POA: Diagnosis not present

## 2014-05-12 DIAGNOSIS — Z79899 Other long term (current) drug therapy: Secondary | ICD-10-CM | POA: Insufficient documentation

## 2014-05-12 DIAGNOSIS — K219 Gastro-esophageal reflux disease without esophagitis: Secondary | ICD-10-CM | POA: Insufficient documentation

## 2014-05-12 DIAGNOSIS — R739 Hyperglycemia, unspecified: Secondary | ICD-10-CM

## 2014-05-12 DIAGNOSIS — G4489 Other headache syndrome: Secondary | ICD-10-CM | POA: Insufficient documentation

## 2014-05-12 DIAGNOSIS — R51 Headache: Secondary | ICD-10-CM | POA: Diagnosis present

## 2014-05-12 DIAGNOSIS — Z9889 Other specified postprocedural states: Secondary | ICD-10-CM | POA: Insufficient documentation

## 2014-05-12 LAB — I-STAT CHEM 8, ED
BUN: 14 mg/dL (ref 6–23)
CREATININE: 0.9 mg/dL (ref 0.50–1.35)
Calcium, Ion: 1.14 mmol/L (ref 1.13–1.30)
Chloride: 98 mmol/L (ref 96–112)
GLUCOSE: 196 mg/dL — AB (ref 70–99)
HEMATOCRIT: 46 % (ref 39.0–52.0)
Hemoglobin: 15.6 g/dL (ref 13.0–17.0)
POTASSIUM: 4.3 mmol/L (ref 3.5–5.1)
SODIUM: 135 mmol/L (ref 135–145)
TCO2: 23 mmol/L (ref 0–100)

## 2014-05-12 LAB — COMPREHENSIVE METABOLIC PANEL
ALT: 31 U/L (ref 0–53)
AST: 22 U/L (ref 0–37)
Albumin: 4.2 g/dL (ref 3.5–5.2)
Alkaline Phosphatase: 85 U/L (ref 39–117)
Anion gap: 10 (ref 5–15)
BUN: 12 mg/dL (ref 6–23)
CALCIUM: 8.7 mg/dL (ref 8.4–10.5)
CHLORIDE: 98 mmol/L (ref 96–112)
CO2: 25 mmol/L (ref 19–32)
Creatinine, Ser: 0.97 mg/dL (ref 0.50–1.35)
GFR calc non Af Amer: 84 mL/min — ABNORMAL LOW (ref >90)
Glucose, Bld: 194 mg/dL — ABNORMAL HIGH (ref 70–99)
Potassium: 4.3 mmol/L (ref 3.5–5.1)
Sodium: 133 mmol/L — ABNORMAL LOW (ref 135–145)
TOTAL PROTEIN: 6.8 g/dL (ref 6.0–8.3)
Total Bilirubin: 1.1 mg/dL (ref 0.3–1.2)

## 2014-05-12 LAB — URINALYSIS, ROUTINE W REFLEX MICROSCOPIC
Bilirubin Urine: NEGATIVE
Glucose, UA: >1000 mg/dL — AB
Hgb urine dipstick: NEGATIVE
Ketones, ur: 15 mg/dL — AB
Leukocytes, UA: NEGATIVE
NITRITE: NEGATIVE
Protein, ur: NEGATIVE mg/dL
Specific Gravity, Urine: 1.028 (ref 1.005–1.030)
UROBILINOGEN UA: 1 mg/dL (ref 0.0–1.0)
pH: 5.5 (ref 5.0–8.0)

## 2014-05-12 LAB — DIFFERENTIAL
BASOS PCT: 0 % (ref 0–1)
Basophils Absolute: 0 10*3/uL (ref 0.0–0.1)
Eosinophils Absolute: 0.1 10*3/uL (ref 0.0–0.7)
Eosinophils Relative: 2 % (ref 0–5)
Lymphocytes Relative: 20 % (ref 12–46)
Lymphs Abs: 1.4 10*3/uL (ref 0.7–4.0)
Monocytes Absolute: 0.4 10*3/uL (ref 0.1–1.0)
Monocytes Relative: 6 % (ref 3–12)
NEUTROS ABS: 4.7 10*3/uL (ref 1.7–7.7)
NEUTROS PCT: 72 % (ref 43–77)

## 2014-05-12 LAB — RAPID URINE DRUG SCREEN, HOSP PERFORMED
AMPHETAMINES: NOT DETECTED
BARBITURATES: NOT DETECTED
Benzodiazepines: NOT DETECTED
Cocaine: NOT DETECTED
OPIATES: NOT DETECTED
TETRAHYDROCANNABINOL: NOT DETECTED

## 2014-05-12 LAB — CBC
HCT: 43.9 % (ref 39.0–52.0)
HEMOGLOBIN: 15.1 g/dL (ref 13.0–17.0)
MCH: 31.9 pg (ref 26.0–34.0)
MCHC: 34.4 g/dL (ref 30.0–36.0)
MCV: 92.8 fL (ref 78.0–100.0)
Platelets: 216 10*3/uL (ref 150–400)
RBC: 4.73 MIL/uL (ref 4.22–5.81)
RDW: 12.5 % (ref 11.5–15.5)
WBC: 6.6 10*3/uL (ref 4.0–10.5)

## 2014-05-12 LAB — APTT: aPTT: 27 seconds (ref 24–37)

## 2014-05-12 LAB — PROTIME-INR
INR: 1.02 (ref 0.00–1.49)
PROTHROMBIN TIME: 13.5 s (ref 11.6–15.2)

## 2014-05-12 LAB — I-STAT TROPONIN, ED: TROPONIN I, POC: 0.01 ng/mL (ref 0.00–0.08)

## 2014-05-12 LAB — URINE MICROSCOPIC-ADD ON

## 2014-05-12 LAB — ETHANOL: Alcohol, Ethyl (B): <5 mg/dL (ref 0–9)

## 2014-05-12 MED ORDER — OXYCODONE-ACETAMINOPHEN 5-325 MG PO TABS
1.0000 | ORAL_TABLET | Freq: Once | ORAL | Status: AC
  Administered 2014-05-12: 1 via ORAL
  Filled 2014-05-12: qty 1

## 2014-05-12 NOTE — ED Notes (Signed)
Phlebotomy at bedside.

## 2014-05-12 NOTE — ED Provider Notes (Signed)
CSN: [id number]     Arrival date & time 05/12/14  6967 History   First MD Initiated Contact with Patient 05/12/14 [phone]     Chief Complaint  Patient presents with  . Headache     (Consider location/radiation/quality/duration/timing/severity/associated sxs/prior Treatment) HPI   [name] is a 64 y.o. male who was awakened this morning with a "funny feeling in the head". He also has a headache. He has nausea without vomiting. His blood pressure was elevated when checked at home, and by EMS. He did not eat this morning. He has not taken his medications yet. His never had this before. He admits to stress recently over the death of friend several months ago. He is here with his sister. He works part-time. There are no other known modifying factors.   Past Medical History  Diagnosis Date  . 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 Hyperlipidemia   . GERD (gastroesophageal reflux disease) 01/13/2011  . Depression   . Diabetes mellitus type 2, noninsulin dependent 01/13/2011  . Essential hypertension   . Esophageal spasm   . History of cardiac cath     a. 2005: normal cors, normal EF. b. Nuc 12/2010: normal, EF 61%. 2D Echo 12/2010: EF normal (% not given), no RWMA, trivial AI, aortic root trivially dilated, mild MR.   Past Surgical History  Procedure Laterality Date  . Prostatectomy    . Cardiac catheterization  2005    No CAD   Family History  Problem Relation Age of Onset  . Colon cancer Father   . Cancer Mother   . Stroke Brother     [place] and hit head, ? stroke  . CAD Brother     Bypass in his 59's.  [name] COPD Sister   . Hypertension Sister    History  Substance Use Topics  . Smoking status: Never Smoker   . Smokeless tobacco: Never Used  . Alcohol Use: No    Review of Systems  All other systems reviewed and are negative.     Allergies  Iohexol  Home Medications   Prior to Admission medications   Medication Sig Start Date  End Date Taking? Authorizing Provider  aspirin EC 325 MG EC tablet Take 1 tablet (325 mg total) by mouth daily. 02/02/14  Yes Geradine Girt, DO  carvedilol (COREG) 12.5 MG tablet Take 1 tablet (12.5 mg total) by mouth 2 (two) times daily with a meal. 02/02/14  Yes Geradine Girt, DO  dicyclomine (BENTYL) 20 MG tablet Take 20 mg by mouth 2 (two) times daily.  01/19/14  Yes Historical Provider, MD  glimepiride (AMARYL) 4 MG tablet Take 4 mg by mouth 2 (two) times daily.   Yes Historical Provider, MD  lisinopril (PRINIVIL,ZESTRIL) 10 MG tablet Take 10 mg by mouth 2 (two) times daily.  11/20/13  Yes Historical Provider, MD  metFORMIN (GLUCOPHAGE) 1000 MG tablet Take 1,000 mg by mouth 2 (two) times daily with a meal.     Yes Historical Provider, MD  pantoprazole (PROTONIX) 40 MG tablet Take 40 mg by mouth daily.   Yes Historical Provider, MD  pravastatin (PRAVACHOL) 40 MG tablet Take 40 mg by mouth daily.  12/28/13  Yes Historical Provider, MD  sertraline (ZOLOFT) 50 MG tablet Take 50 mg by mouth daily. 01/06/14  Yes Historical Provider, MD  Vitamin D, Ergocalciferol, (DRISDOL) 50000 UNITS CAPS capsule Take 50,000 Units by mouth every 7 (seven) days.   Yes  Historical Provider, MD  glimepiride (AMARYL) 2 MG tablet Take 2 mg by mouth 2 (two) times daily.     Historical Provider, MD  Multiple Vitamin (MULTIVITAMIN WITH MINERALS) TABS tablet Take 2 tablets by mouth daily.    Historical Provider, MD  omeprazole (PRILOSEC OTC) 20 MG tablet Take 20 mg by mouth daily.    Historical Provider, MD   BP 142/73 mmHg  Pulse 70  Temp(Src) 98.2 F (36.8 C) (Oral)  Resp 15  Ht 5\' 6"  (1.676 m)  Wt 145 lb (65.772 kg)  BMI 23.41 kg/m2  SpO2 98% Physical Exam  Constitutional: He is oriented to person, place, and time. He appears well-developed and well-nourished.  HENT:  Head: Normocephalic and atraumatic.  Right Ear: External ear normal.  Left Ear: External ear normal.  Eyes: Conjunctivae and EOM are normal.  Pupils are equal, round, and reactive to light.  Neck: Normal range of motion and phonation normal. Neck supple.  Cardiovascular: Normal rate, regular rhythm and normal heart sounds.   Pulmonary/Chest: Effort normal and breath sounds normal. He exhibits no bony tenderness.  Abdominal: Soft. There is no tenderness.  Musculoskeletal: Normal range of motion.  Neurological: He is alert and oriented to person, place, and time. No cranial nerve deficit or sensory deficit. He exhibits normal muscle tone. Coordination normal.  No dysphasia, dysarthria or nystagmus. No ataxia. Normal finger-to-nose and heel-to-shin, bilaterally.  Skin: Skin is warm, dry and intact.  Psychiatric: He has a normal mood and affect. His behavior is normal. Judgment and thought content normal.  Nursing note and vitals reviewed.   ED Course  Procedures (including critical care time)  Medications  oxyCODONE-acetaminophen (PERCOCET/ROXICET) 5-325 MG per tablet 1 tablet (1 tablet Oral Given 05/12/14 0934)    Patient Vitals for the past 24 hrs:  BP Temp Temp src Pulse Resp SpO2 Height Weight  05/12/14 1115 142/73 mmHg - - 70 15 98 % - -  05/12/14 1056 147/76 mmHg - - 71 16 97 % - -  05/12/14 1030 129/69 mmHg - - 64 12 97 % - -  05/12/14 1015 133/71 mmHg - - 64 17 98 % - -  05/12/14 1000 129/72 mmHg - - 65 13 98 % - -  05/12/14 0930 144/73 mmHg - - 68 10 98 % - -  05/12/14 0915 134/74 mmHg - - 68 15 98 % - -  05/12/14 0900 147/77 mmHg - - 63 18 100 % - -  05/12/14 0830 141/76 mmHg - - 65 15 99 % - -  05/12/14 0814 173/82 mmHg - - 64 12 98 % - -  05/12/14 0752 - 98.2 F (36.8 C) - - - - - -  05/12/14 0745 147/91 mmHg - - 65 - 100 % - -  05/12/14 0730 149/78 mmHg - - 64 - 99 % - -  05/12/14 0715 169/83 mmHg - - 65 - 100 % - -  05/12/14 0701 163/86 mmHg 98.4 F (36.9 C) Oral 69 16 100 % 5\' 6"  (1.676 m) 145 lb (65.772 kg)  05/12/14 0700 - - - - - 100 % - -    11:29 AM Reevaluation with update and discussion. After  initial assessment and treatment, an updated evaluation reveals patient is comfortable now. He's been able ambulate without problems. Findings discussed with the patient, all questions were answered.Daleen Bo L    Labs Review Labs Reviewed  COMPREHENSIVE METABOLIC PANEL - Abnormal; Notable for the following:    Sodium 133 (*)  Glucose, Bld 194 (*)    GFR calc non Af Amer 84 (*)    All other components within normal limits  URINALYSIS, ROUTINE W REFLEX MICROSCOPIC - Abnormal; Notable for the following:    Glucose, UA >1000 (*)    Ketones, ur 15 (*)    All other components within normal limits  URINE MICROSCOPIC-ADD ON - Abnormal; Notable for the following:    Casts HYALINE CASTS (*)    All other components within normal limits  I-STAT CHEM 8, ED - Abnormal; Notable for the following:    Glucose, Bld 196 (*)    All other components within normal limits  ETHANOL  PROTIME-INR  APTT  CBC  DIFFERENTIAL  URINE RAPID DRUG SCREEN (HOSP PERFORMED)  I-STAT TROPOININ, ED  I-STAT TROPOININ, ED    Imaging Review Ct Head Wo Contrast  05/12/2014   CLINICAL DATA:  64 year old male with confusion and headache. Initial encounter.  EXAM: CT HEAD WITHOUT CONTRAST  TECHNIQUE: Contiguous axial images were obtained from the base of the skull through the vertex without intravenous contrast.  COMPARISON:  Brain MRI 02/01/2014 and earlier.  FINDINGS: Stable visible paranasal sinuses and mastoids. Stable visualized osseous structures. Negative orbit and scalp soft tissues.  Cerebral volume remains normal for age. No ventriculomegaly. No midline shift, ventriculomegaly, mass effect, evidence of mass lesion, intracranial hemorrhage or evidence of cortically based acute infarction. Gray-white matter differentiation is within normal limits throughout the brain. No suspicious intracranial vascular hyperdensity.  IMPRESSION: Stable and normal for age non contrast CT appearance of the brain.   Electronically  Signed   By: Genevie Ann M.D.   On: 05/12/2014 08:12     EKG Interpretation   Date/Time:  Wednesday May 12 2014 07:48:23 EDT Ventricular Rate:  65 PR Interval:  167 QRS Duration: 89 QT Interval:  395 QTC Calculation: 411 R Axis:   60 Text Interpretation:  Sinus rhythm Abnormal R-wave progression, early  transition since last tracing no significant change Confirmed by Eulis Foster   MD, Zonnique Norkus ([id number]) on 05/12/2014 7:54:48 AM      MDM   Final diagnoses:  Malaise  Dehydration  Hyperglycemia  Other headache syndrome    Nonspecific malaise with mild headache. Doubt hypertensive urgency, CVA, metabolic instability, serious bacterial infection. Mild incidental hyperglycemia, and dehydration. These may be contributory to his symptoms.  Nursing Notes Reviewed/ Care Coordinated Applicable Imaging Reviewed Interpretation of Laboratory Data incorporated into ED treatment  The patient appears reasonably screened and/or stabilized for discharge and I doubt any other medical condition or other [facility] requiring further screening, evaluation, or treatment in the ED at this time prior to discharge.  Plan: Home Medications- usual; Home Treatments- increase fluids; return here if the recommended treatment, does not improve the symptoms; Recommended follow up- PCP 1 week and prn     Daleen Bo, MD 05/12/14 1134

## 2014-05-12 NOTE — ED Notes (Signed)
Per EMS, pt woke up this morning at 0500 with a headache. Pt states that "he did not feel like himself" Pt complaining of headache of 2/10. Pt denies blurred vision or dizziness. Pt ambulatory unassisted with ems. Neuro fully in tact. Pt in no acute distress.

## 2014-05-12 NOTE — ED Notes (Signed)
Patient did well ambulating.  Patient stated he felt a little dizzy while we were ambulating but not bad.  Patient 02 Stats started at 100 on RA.  Dropped to 96% and then went back to 98% at RA.  Ambulated around [place] three times.

## 2014-05-12 NOTE — Discharge Instructions (Signed)
Try to drink more fluids. Be careful about your carbohydrate intake. Use Tylenol or Motrin, if needed for headache.     Dehydration, Adult Dehydration is when you lose more fluids from the body than you take in. Vital organs like the kidneys, brain, and heart cannot function without a proper amount of fluids and salt. Any loss of fluids from the body can cause dehydration.  CAUSES   Vomiting.  Diarrhea.  Excessive sweating.  Excessive urine output.  Fever. SYMPTOMS  Mild dehydration  Thirst.  Dry lips.  Slightly dry mouth. Moderate dehydration  Very dry mouth.  Sunken eyes.  Skin does not bounce back quickly when lightly pinched and released.  Dark urine and decreased urine production.  Decreased tear production.  Headache. Severe dehydration  Very dry mouth.  Extreme thirst.  Rapid, weak pulse (more than 100 beats per minute at rest).  Cold hands and feet.  Not able to sweat in spite of heat and temperature.  Rapid breathing.  Blue lips.  Confusion and lethargy.  Difficulty being awakened.  Minimal urine production.  No tears. DIAGNOSIS  Your caregiver will diagnose dehydration based on your symptoms and your exam. Blood and urine tests will help confirm the diagnosis. The diagnostic evaluation should also identify the cause of dehydration. TREATMENT  Treatment of mild or moderate dehydration can often be done at home by increasing the amount of fluids that you drink. It is best to drink small amounts of fluid more often. Drinking too much at one time can make vomiting worse. Refer to the home care instructions below. Severe dehydration needs to be treated at the hospital where you will probably be given intravenous (IV) fluids that contain water and electrolytes. HOME CARE INSTRUCTIONS   Ask your caregiver about specific rehydration instructions.  Drink enough fluids to keep your urine clear or pale yellow.  Drink small amounts frequently  if you have nausea and vomiting.  Eat as you normally do.  Avoid:  Foods or drinks high in sugar.  Carbonated drinks.  Juice.  Extremely hot or cold fluids.  Drinks with caffeine.  Fatty, greasy foods.  Alcohol.  Tobacco.  Overeating.  Gelatin desserts.  Wash your hands well to avoid spreading bacteria and viruses.  Only take over-the-counter or prescription medicines for pain, discomfort, or fever as directed by your caregiver.  Ask your caregiver if you should continue all prescribed and over-the-counter medicines.  Keep all follow-up appointments with your caregiver. SEEK MEDICAL CARE IF:  You have abdominal pain and it increases or stays in one area (localizes).  You have a rash, stiff neck, or severe headache.  You are irritable, sleepy, or difficult to awaken.  You are weak, dizzy, or extremely thirsty. SEEK IMMEDIATE MEDICAL CARE IF:   You are unable to keep fluids down or you get worse despite treatment.  You have frequent episodes of vomiting or diarrhea.  You have blood or green matter (bile) in your vomit.  You have blood in your stool or your stool looks black and tarry.  You have not urinated in 6 to 8 hours, or you have only urinated a small amount of very dark urine.  You have a fever.  You faint. MAKE SURE YOU:   Understand these instructions.  Will watch your condition.  Will get help right away if you are not doing well or get worse. Document Released: 01/08/2005 Document Revised: 04/02/2011 Document Reviewed: 08/28/2010 [facility] Patient Information 2015 [place], [place]. This information is not intended  to replace advice given to you by your health care provider. Make sure you discuss any questions you have with your health care provider.  General Headache Without Cause A general headache is pain or discomfort felt around the head or neck area. The cause may not be found.  HOME CARE   Keep all doctor visits.  Only take  medicines as told by your doctor.  Lie down in a dark, quiet room when you have a headache.  Keep a journal to find out if certain things bring on headaches. For example, write down:  What you eat and drink.  How much sleep you get.  Any change to your diet or medicines.  Relax by getting a massage or doing other relaxing activities.  Put ice or heat packs on the head and neck area as told by your doctor.  Lessen stress.  Sit up straight. Do not tighten (tense) your muscles.  Quit smoking if you smoke.  Lessen how much alcohol you drink.  Lessen how much caffeine you drink, or stop drinking caffeine.  Eat and sleep on a regular schedule.  Get 7 to 9 hours of sleep, or as told by your doctor.  Keep lights dim if bright lights bother you or make your headaches worse. GET HELP RIGHT AWAY IF:   Your headache becomes really bad.  You have a fever.  You have a stiff neck.  You have trouble seeing.  Your muscles are weak, or you lose muscle control.  You lose your balance or have trouble walking.  You feel like you will pass out (faint), or you pass out.  You have really bad symptoms that are different than your first symptoms.  You have problems with the medicines given to you by your doctor.  Your medicines do not work.  Your headache feels different than the other headaches.  You feel sick to your stomach (nauseous) or throw up (vomit). MAKE SURE YOU:   Understand these instructions.  Will watch your condition.  Will get help right away if you are not doing well or get worse. Document Released: 10/18/2007 Document Revised: 04/02/2011 Document Reviewed: 12/29/2010 [facility] Patient Information 2015 [place], [place]. This information is not intended to replace advice given to you by your health care provider. Make sure you discuss any questions you have with your health care provider.  High Blood Sugar High blood sugar (hyperglycemia) means that the level of  sugar in your blood is higher than it should be. Signs of high blood sugar include:  Feeling thirsty.  Frequent peeing (urinating).  Feeling tired or sleepy.  Dry mouth.  Vision changes.  Feeling weak.  Feeling hungry but losing weight.  Numbness and tingling in your hands or feet.  Headache. When you ignore these signs, your blood sugar may keep going up. These problems may get worse, and other problems may begin. HOME CARE  Check your blood sugars as told by your doctor. Write down the numbers with the date and time.  Take the right amount of insulin or diabetes pills at the right time. Write down the dose with date and time.  Refill your insulin or diabetes pills before running out.  Watch what you eat. Follow your meal plan.  Drink liquids without sugar, such as water. Check with your doctor if you have kidney or heart disease.  Follow your doctor's orders for exercise. Exercise at the same time of day.  Keep your doctor's appointments. GET HELP RIGHT AWAY IF:  You have trouble thinking or are confused.  You have fast breathing with fruity smelling breath.  You pass out (faint).  You have 2 to 3 days of high blood sugars and you do not know why.  You have chest pain.  You are feeling sick to your stomach (nauseous) or throwing up (vomiting).  You have sudden vision changes. MAKE SURE YOU:   Understand these instructions.  Will watch your condition.  Will get help right away if you are not doing well or get worse. Document Released: 11/05/2008 Document Revised: 04/02/2011 Document Reviewed: 11/05/2008 [facility] Patient Information 2015 [place], [place]. This information is not intended to replace advice given to you by your health care provider. Make sure you discuss any questions you have with your health care provider.

## 2015-04-13 ENCOUNTER — Ambulatory Visit: Payer: Commercial Managed Care - PPO | Admitting: *Deleted

## 2016-02-13 IMAGING — MR MR HEAD W/O CM
9 of 12 series · 30 of 48 positions shown · non-contrast
Comparison: CT head without contrast 02/01/2014

CLINICAL DATA: Left-sided weakness. Chest pain with acute onset of
numbness and is mouth and numbness in the left upper extremity.

EXAM:
MRI HEAD WITHOUT CONTRAST
MRA HEAD WITHOUT CONTRAST
TECHNIQUE: Multiplanar, multiecho pulse sequences of the brain and surrounding
structures were obtained without intravenous contrast. Angiographic
images of the head were obtained using MRA technique without
contrast.

[Series 2: FLAIR · sagittal · 5.0mm · 0.47mm/px · 2 of 23 slices shown (1 of 2)]
[im 1/23]
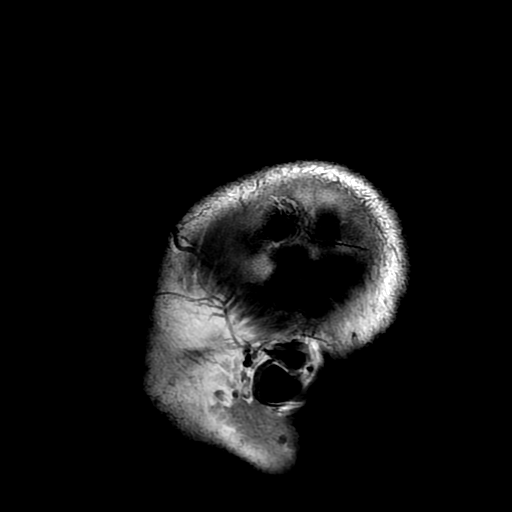
[im 23/23]
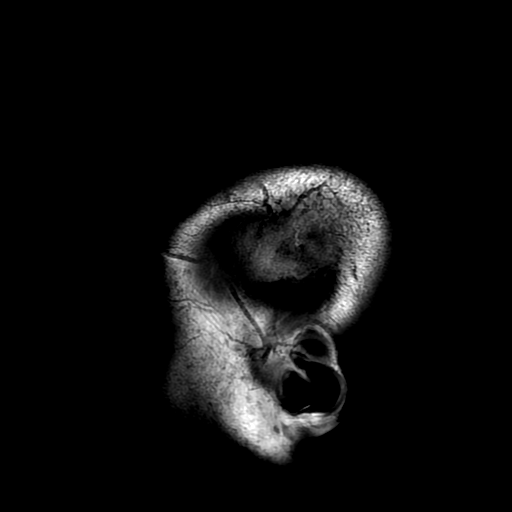

[Series 4: DWI · axial · 3.0mm · 0.94mm/px · z∈[-71,+66]mm · 7 of 94 slices shown (1 of 4)]
[im 1/94]
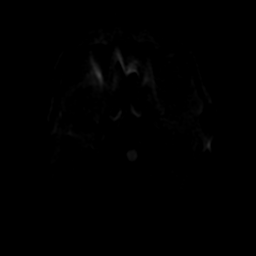
[im 16/94]
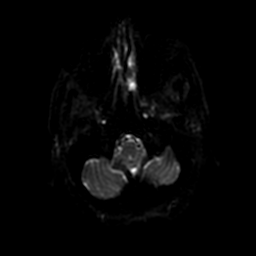
[im 32/94]
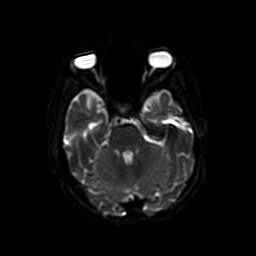
[im 47/94]
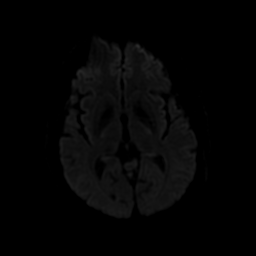
[im 63/94]
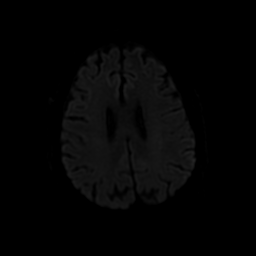
[im 78/94]
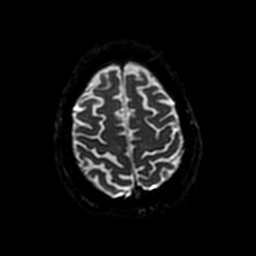
[im 94/94]
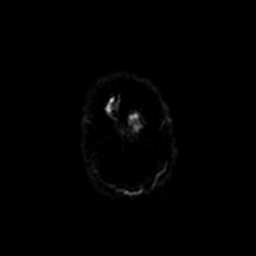

[Series 5: ax (id) 2 · axial · 1.2mm · 0.43mm/px · z∈[-66,-10]mm · 5 of 174 slices shown]
[im 1/174]
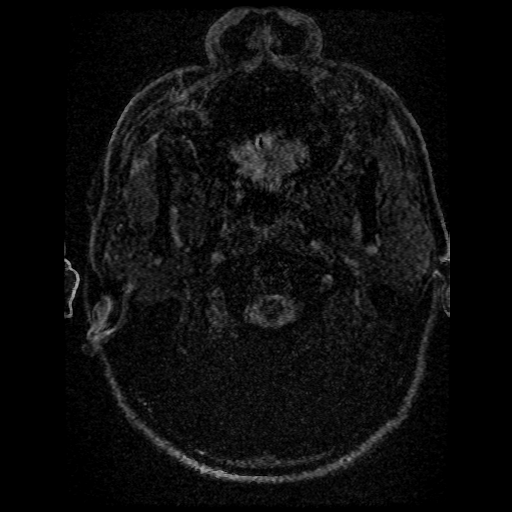
[im 32/174]
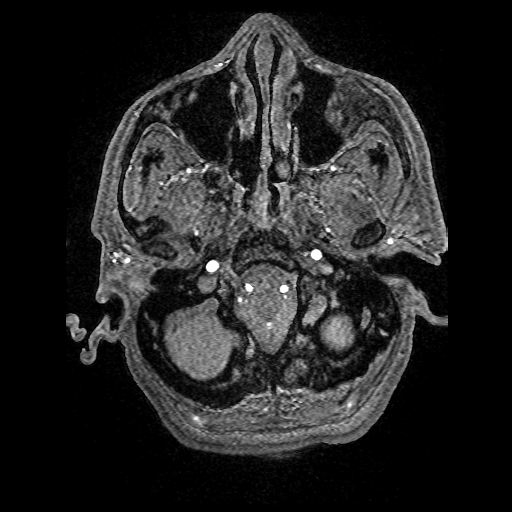
[im 48/174]
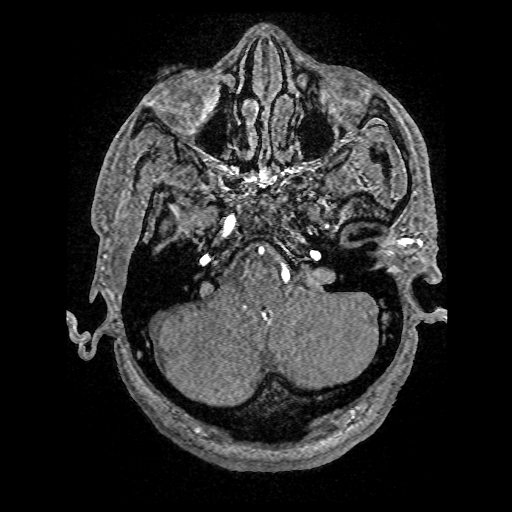
[im 79/174]
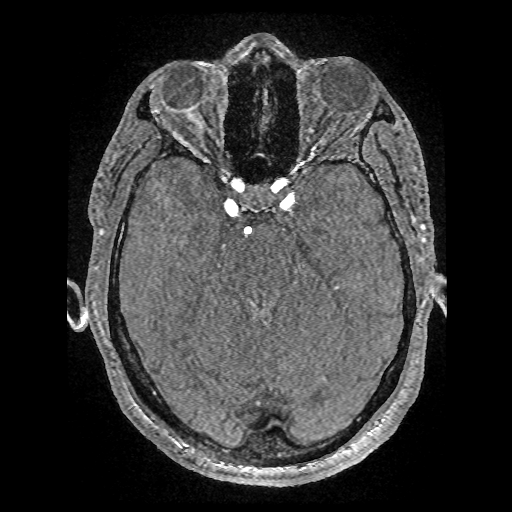
[im 95/174]
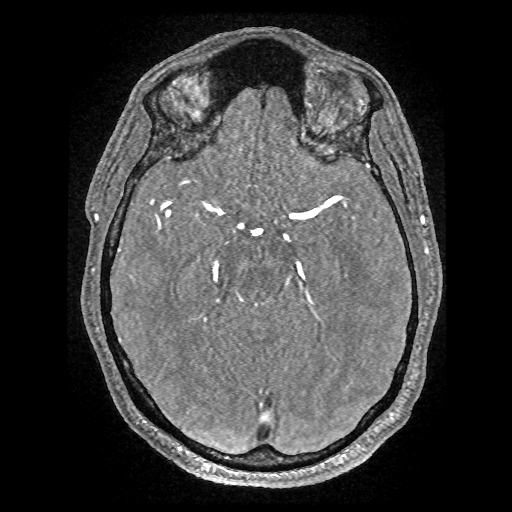

[Series 6: T2 · axial · 5.0mm · 0.43mm/px · z∈[-70,+67]mm · 2 of 24 slices shown (1 of 2)]
[im 1/24]
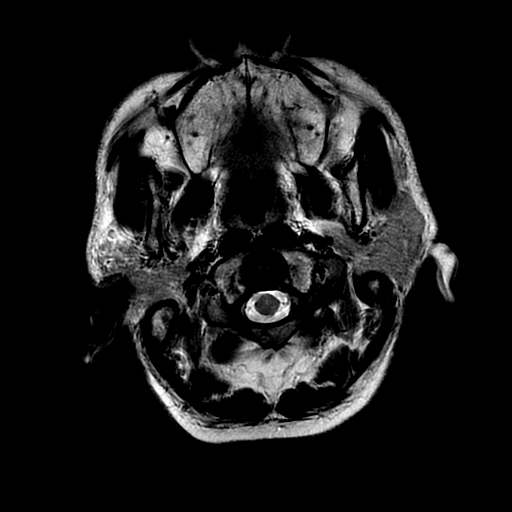
[im 24/24]
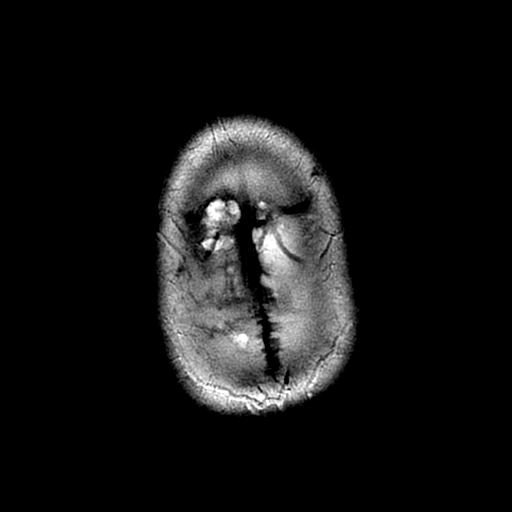

[Series 7: FLAIR · axial · 5.0mm · 0.43mm/px · z∈[-70,+67]mm · 2 of 24 slices shown (2 of 2)]
[im 1/24]
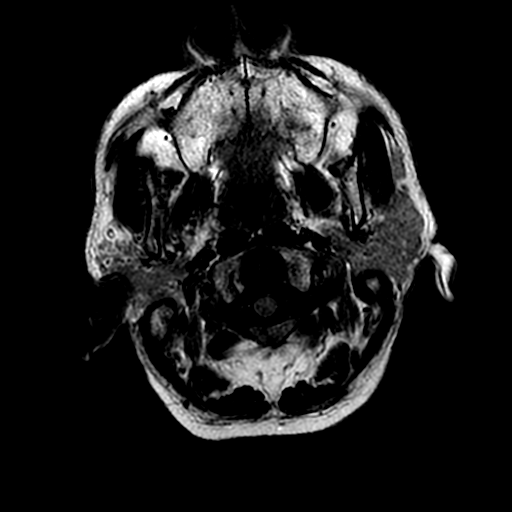
[im 24/24]
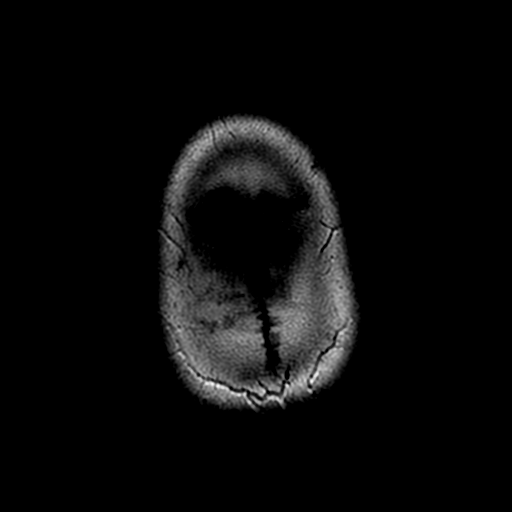

[Series 8: DWI · coronal · 5.0mm · 0.94mm/px · 5 of 70 slices shown (2 of 4)]
[im 1/70]
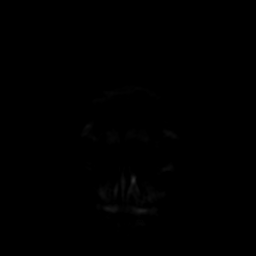
[im 18/70]
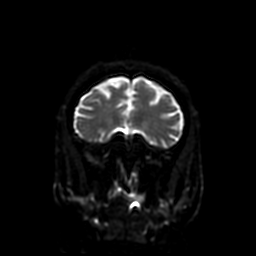
[im 35/70]
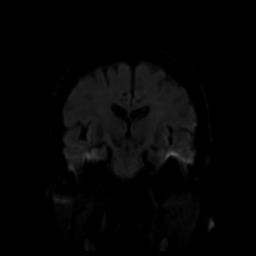
[im 52/70]
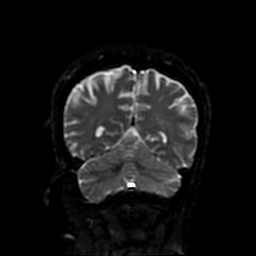
[im 70/70]
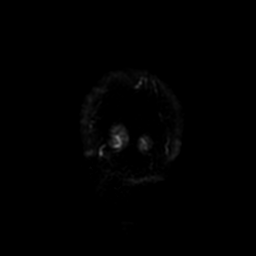

[Series 11: T2 · coronal · 5.0mm · 0.47mm/px · 2 of 30 slices shown (2 of 2)]
[im 1/30]
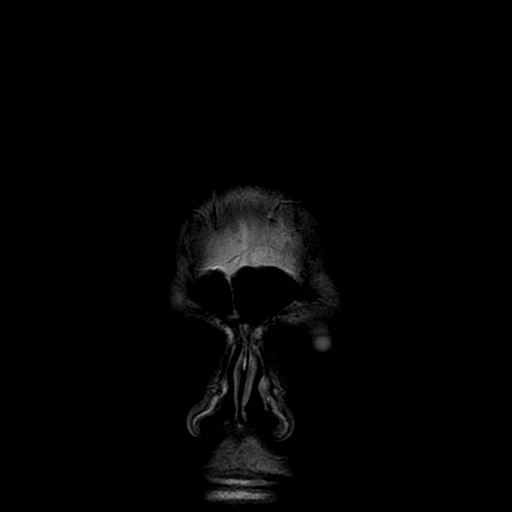
[im 30/30]
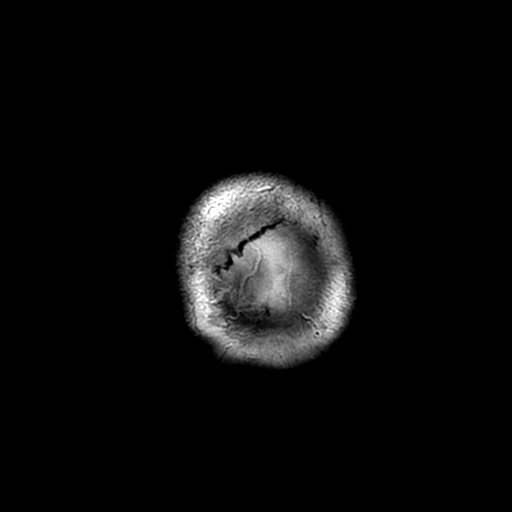

[Series 400: DWI · axial · 3.0mm · 0.94mm/px · z∈[-71,+66]mm · 3 of 47 slices shown (3 of 4)]
[im 1/47]
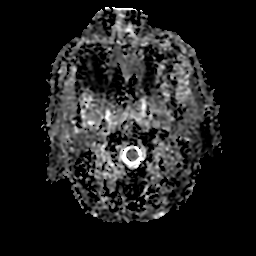
[im 24/47]
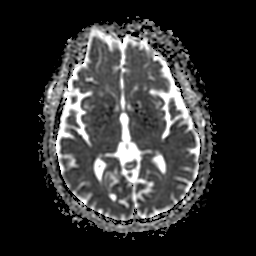
[im 47/47]
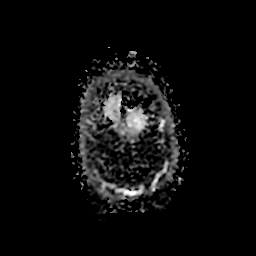

[Series 800: DWI · coronal · 5.0mm · 0.94mm/px · 2 of 35 slices shown (4 of 4)]
[im 1/35]
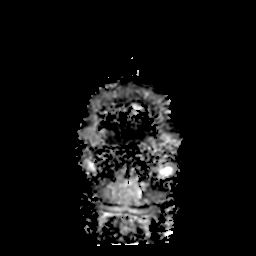
[im 35/35]
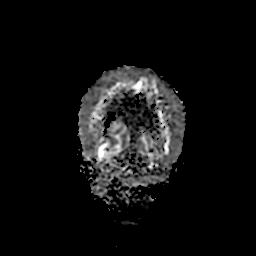

[30 of 48 positions shown; findings below may reference images not displayed]

FINDINGS: MRI HEAD FINDINGS

Chronic changes in the anterior clivus are stable. The cerebellar
tonsils are again noted to slightly extend below the foramen magnum.
The posterior fossa is otherwise unremarkable. Midline structures
are otherwise within normal limits.

The diffusion-weighted images demonstrate no evidence for acute or
subacute infarction. Scattered periventricular and subcortical T2
hyperintensities bilaterally are similar to prior MRI.

No acute hemorrhage or mass lesion is present. The ventricles are
proportionate to the degree of atrophy. No significant extra-axial
fluid collection is present.

Flow is present in the major intracranial arteries. The a scratch
the bilateral lens replacements are noted. The paranasal sinuses
again demonstrate a chronic osteoma in the anterior left maxillary
sinus. There is no significant interval change. No focal mucosal
thickening or fluid levels are present.

MRA HEAD FINDINGS

The internal carotid arteries are within normal limits from the high
cervical segments through the ICA termini. A 1 mm right posterior
communicating artery aneurysm or infundibulum may then obscured by
patient motion on the prior study. A small posterior communicating
artery is visible, suggesting this is an infundibulum. The A1 and M1
segments are normal. The anterior communicating artery is patent.
The ACA and MCA branch vessels are within normal limits.

The left vertebral artery is slightly dominant to the right. The
PICA origins are visualized and normal bilaterally. A prominent left
AICA is noted. The basilar artery is normal. Both posterior cerebral
arteries originate from the basilar tip. The PCA branch vessels are
normal.
IMPRESSION: 1. Stable cerebellar tonsillar ectopia without a definitive Chiari
malformation.
2. Scattered subcortical T2 hyperintensities bilaterally are stable.
3. No acute intracranial abnormality or significant interval change.
4. 1 mm infundibulum of the right posterior communicating artery.
5. No other significant proximal stenosis, aneurysm, or branch
vessel occlusion within the circle of Willis.

## 2016-07-31 ENCOUNTER — Emergency Department (HOSPITAL_COMMUNITY)
Admission: EM | Admit: 2016-07-31 | Discharge: 2016-07-31 | Disposition: A | Discharge status: Home / Self Care | Payer: Medicare Other | Attending: Emergency Medicine | Admitting: Emergency Medicine

## 2016-07-31 ENCOUNTER — Emergency Department (HOSPITAL_COMMUNITY): Payer: Medicare Other

## 2016-07-31 ENCOUNTER — Encounter (HOSPITAL_COMMUNITY): Payer: Self-pay | Admitting: Emergency Medicine

## 2016-07-31 DIAGNOSIS — Z7984 Long term (current) use of oral hypoglycemic drugs: Secondary | ICD-10-CM | POA: Diagnosis not present

## 2016-07-31 DIAGNOSIS — R51 Headache: Secondary | ICD-10-CM | POA: Insufficient documentation

## 2016-07-31 DIAGNOSIS — Z8673 Personal history of transient ischemic attack (TIA), and cerebral infarction without residual deficits: Secondary | ICD-10-CM | POA: Diagnosis not present

## 2016-07-31 DIAGNOSIS — E119 Type 2 diabetes mellitus without complications: Secondary | ICD-10-CM | POA: Diagnosis not present

## 2016-07-31 DIAGNOSIS — Z79899 Other long term (current) drug therapy: Secondary | ICD-10-CM | POA: Insufficient documentation

## 2016-07-31 DIAGNOSIS — R42 Dizziness and giddiness: Secondary | ICD-10-CM | POA: Diagnosis present

## 2016-07-31 DIAGNOSIS — I10 Essential (primary) hypertension: Secondary | ICD-10-CM | POA: Insufficient documentation

## 2016-07-31 DIAGNOSIS — Z8546 Personal history of malignant neoplasm of prostate: Secondary | ICD-10-CM | POA: Diagnosis not present

## 2016-07-31 DIAGNOSIS — R519 Headache, unspecified: Secondary | ICD-10-CM

## 2016-07-31 DIAGNOSIS — Z7982 Long term (current) use of aspirin: Secondary | ICD-10-CM | POA: Insufficient documentation

## 2016-07-31 LAB — URINALYSIS, ROUTINE W REFLEX MICROSCOPIC
Bacteria, UA: NONE SEEN
Bilirubin Urine: NEGATIVE
Hgb urine dipstick: NEGATIVE
Ketones, ur: NEGATIVE mg/dL
Leukocytes, UA: NEGATIVE
Nitrite: NEGATIVE
PROTEIN: NEGATIVE mg/dL
Specific Gravity, Urine: 1.01 (ref 1.005–1.030)
Squamous Epithelial / LPF: NONE SEEN
pH: 6 (ref 5.0–8.0)

## 2016-07-31 LAB — BASIC METABOLIC PANEL
Anion gap: 8 (ref 5–15)
BUN: 12 mg/dL (ref 6–20)
CALCIUM: 8.9 mg/dL (ref 8.9–10.3)
CO2: 25 mmol/L (ref 22–32)
Chloride: 99 mmol/L — ABNORMAL LOW (ref 101–111)
Creatinine, Ser: 1.01 mg/dL (ref 0.61–1.24)
GFR calc non Af Amer: >60 mL/min (ref >60)
GLUCOSE: 165 mg/dL — AB (ref 65–99)
Potassium: 4.4 mmol/L (ref 3.5–5.1)
SODIUM: 132 mmol/L — AB (ref 135–145)

## 2016-07-31 LAB — CBC
HCT: 47.4 % (ref 39.0–52.0)
Hemoglobin: 15.9 g/dL (ref 13.0–17.0)
MCH: 31.1 pg (ref 26.0–34.0)
MCHC: 33.5 g/dL (ref 30.0–36.0)
MCV: 92.6 fL (ref 78.0–100.0)
Platelets: 190 10*3/uL (ref 150–400)
RBC: 5.12 MIL/uL (ref 4.22–5.81)
RDW: 12.7 % (ref 11.5–15.5)
WBC: 4.5 10*3/uL (ref 4.0–10.5)

## 2016-07-31 LAB — I-STAT TROPONIN, ED: Troponin i, poc: 0 ng/mL (ref 0.00–0.08)

## 2016-07-31 NOTE — ED Provider Notes (Signed)
Medical screening examination/treatment/procedure(s) were conducted as a shared visit with non-physician practitioner(s) and myself.  I personally evaluated the patient during the encounter.   EKG Interpretation  Date/Time:  Tuesday July 31 2016 07:40:03 EDT Ventricular Rate:  65 PR Interval:  174 QRS Duration: 84 QT Interval:  362 QTC Calculation: 376 R Axis:   76 Text Interpretation:  Normal sinus rhythm Normal ECG Confirmed by Lacretia Leigh ([id number]) on 07/31/2016 7:57:54 AM     66 year old male complains of intermittent dizziness 2 weeks with a mild headache. No focal neurological deficits. Head CT was negative. Secondary complaint of pain to his right lower abdomen 1 week that is persistent. No urinary symptoms. No fever or chills. Nothing makes the pain better or worse. On exam his abdomen is soft nontender. Labs are Reassuring. Return precautions given   Lacretia Leigh, MD 07/31/16 [phone]

## 2016-07-31 NOTE — Discharge Instructions (Signed)
It was my pleasure taking care of you today!   Thankfully, your labs and imaging today were very reassuring. However, with that being said, I would still like you to follow up with your regular physician for further discussion of today's visit.   Please return to the ER for new or worsening symptoms, fever, trouble with your speech, weakness in your arms/legs, any additional concerns.

## 2016-07-31 NOTE — ED Notes (Signed)
Patient transported to CT 

## 2016-07-31 NOTE — ED Triage Notes (Signed)
Pt. Stated, For about 2 weeks off and on I've been dizzy but this morning I felt really different with dizziness and a headache on my way to work.

## 2016-07-31 NOTE — ED Provider Notes (Signed)
[facility] DEPT Provider Note   CSN: [id number] Arrival date & time: 07/31/16  9390     History   Chief Complaint Chief Complaint  Patient presents with  . Dizziness  . Headache    HPI [name] is a 66 y.o. male.  The history is provided by the patient and medical records. No language interpreter was used.  Dizziness  Associated symptoms: headaches   Associated symptoms: no weakness   Headache     [name] is a 66 y.o. male  with a PMH of DM, HTN, HLD, depression who presents to the Emergency Department after awakening this morning with headache to the top of his head. He has been having similar headaches off and on for about 2 weeks, but today he noted associated with tingling around his lips. Both headache and tingling have been constant since awakening. He notes that his arms and his legs both feel "different than they normally do", however denies feeling numbness/weakness to the extremities. No slurred speech or difficulty finding words either. Patient says he will occasionally have discomfort in his chest due to his gerd / esophageal spasms. He had a similar discomfort this morning, but given his other symptoms, he decided to take a baby aspirin just in case. Chest discomfort now resolved. No other medications taken prior to arrival. No alleviating or aggravating factors noted. Patient does note that a very close friend passed away this week suddenly and unexpectedly. He has been very shaken up about this as well. He denies any trouble breathing, back pain, fevers. It appears he initially told triage that he was dizzy, however he states to me that he is not dizzy but just feels "different than I usually do in my head". Does not feel unsteady when he was walking and does not feel as if the room is spinning.  Past Medical History:  Diagnosis Date  . Depression   . Diabetes mellitus type 2, noninsulin dependent ([place]) 01/13/2011  . Esophageal spasm   . Essential  hypertension   . GERD (gastroesophageal reflux disease) 01/13/2011  . History of cardiac cath    a. 2005: normal cors, normal EF. b. Nuc 12/2010: normal, EF 61%. 2D Echo 12/2010: EF normal (% not given), no RWMA, trivial AI, aortic root trivially dilated, mild MR.  [name] Hyperlipidemia   . 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 Left facial numbness   . Left sided numbness 02/01/2014  . TIA (transient ischemic attack) 02/01/2014  . Left-sided weakness   . Pain in the chest   . Lower abdominal pain 01/31/2014  . Unintentional weight loss 01/31/2014  . Chest pain 01/30/2014  . Headache 03/26/2013  . Essential hypertension   . Chest pain 01/13/2011  . Diabetes mellitus type 2, noninsulin dependent ([place]) 01/13/2011  . GERD (gastroesophageal reflux disease) 01/13/2011  . Hypertensive heart disease without CHF   . Hyperlipidemia   . Depression   . History of prostate cancer 08/22/2004    Past Surgical History:  Procedure Laterality Date  . CARDIAC CATHETERIZATION  2005   No CAD  . PROSTATECTOMY         Home Medications    Prior to Admission medications   Medication Sig Start Date End Date Taking? Authorizing Provider  acetaminophen (TYLENOL) 325 MG tablet Take 650 mg by mouth every 6 (six) hours as needed for mild pain.  Yes [provider]  aspirin EC 325 MG EC tablet Take 1 tablet (325 mg total) by mouth daily. Patient taking differently: Take 325 mg by mouth every 6 (six) hours as needed for pain.  02/02/14  Yes Vann, Jessica U, DO  Aspirin-Acetaminophen-Caffeine (GOODY HEADACHE PO) Take 1 packet by mouth daily as needed (headache).   Yes [provider]  Aspirin-Salicylamide-Caffeine (BC HEADACHE POWDER PO) Take 1 packet by mouth daily as needed (headache).   Yes [provider]  carvedilol (COREG) 12.5 MG tablet Take 1 tablet (12.5 mg total) by mouth 2  (two) times daily with a meal. 02/02/14  Yes Vann, Jessica U, DO  cholecalciferol (VITAMIN D) 1000 units tablet Take 1,000 Units by mouth daily.   Yes [provider]  dicyclomine (BENTYL) 20 MG tablet Take 20 mg by mouth 3 (three) times daily.  01/19/14  Yes [provider]  glimepiride (AMARYL) 4 MG tablet Take 4 mg by mouth daily.    Yes [provider]  JARDIANCE 25 MG TABS tablet Take 25 mg by mouth every morning. 06/11/16  Yes [provider]  lisinopril (PRINIVIL,ZESTRIL) 10 MG tablet Take 10 mg by mouth daily.  11/20/13  Yes [provider]  metFORMIN (GLUCOPHAGE-XR) 500 MG 24 hr tablet Take 500 mg by mouth daily. 04/26/16  Yes [provider]  Multiple Vitamin (MULTIVITAMIN) tablet Take 1 tablet by mouth 2 (two) times a week.   Yes [provider]  omeprazole (PRILOSEC OTC) 20 MG tablet Take 20 mg by mouth daily.   Yes [provider]  pantoprazole (PROTONIX) 40 MG tablet Take 40 mg by mouth daily.   Yes [provider]  pravastatin (PRAVACHOL) 40 MG tablet Take 40 mg by mouth daily.  12/28/13  Yes [provider]    Family History Family History  Problem Relation Age of Onset  . Colon cancer Father   . Cancer Mother   . Stroke Brother        Loda and hit head, ? stroke  . CAD Brother        Bypass in his 38's.  [name] COPD Sister   . Hypertension Sister     Social History Social History  Substance Use Topics  . Smoking status: Never Smoker  . Smokeless tobacco: Never Used  . Alcohol use No     Allergies   Iohexol   Review of Systems Review of Systems  Neurological: Positive for dizziness, numbness and headaches. Negative for syncope, speech difficulty, weakness and light-headedness.       + tingling  All other systems reviewed and are negative.    Physical Exam Updated Vital Signs BP 119/81   Pulse 61   Temp 98.6 F (37 C) (Oral)   Resp 11   Ht 5\' 8"  (1.727 m)   Wt 64.4 kg  (142 lb)   SpO2 (!) 2%   BMI 21.59 kg/m   Physical Exam  Constitutional: He is oriented to person, place, and time. He appears well-developed and well-nourished. No distress.  HENT:  Head: Normocephalic and atraumatic.  Cardiovascular: Normal rate, regular rhythm and normal heart sounds.   No murmur heard. Pulmonary/Chest: Effort normal and breath sounds normal. No respiratory distress. He has no wheezes. He has no rales.  Abdominal: Soft. He exhibits no distension. There is no tenderness.  Musculoskeletal: Normal range of motion. He exhibits no edema.  Neurological: He is alert and oriented to person, place, and time.  Alert, oriented, thought  content appropriate, able to give a coherent history. Speech is clear and goal oriented, able to follow commands.  Cranial Nerves:  II:  Peripheral visual fields grossly normal, pupils equal, round, reactive to light III, IV, VI: EOM intact bilaterally, ptosis not present V,VII: smile symmetric, eyes kept closed tightly against resistance, facial light touch sensation equal VIII: hearing grossly normal IX, X: symmetric soft palate movement, uvula elevates symmetrically  XI: bilateral shoulder shrug symmetric and strong XII: midline tongue extension 5/5 muscle strength in upper and lower extremities bilaterally including strong and equal grip strength and dorsiflexion/plantar flexion Sensory to light touch normal in all four extremities.  Normal finger-to-nose and rapid alternating movements; no drift.  Skin: Skin is warm and dry.  Nursing note and vitals reviewed.    ED Treatments / Results  Labs (all labs ordered are listed, but only abnormal results are displayed) Labs Reviewed  BASIC METABOLIC PANEL - Abnormal; Notable for the following:       Result Value   Sodium 132 (*)    Chloride 99 (*)    Glucose, Bld 165 (*)    All other components within normal limits  URINALYSIS, ROUTINE W REFLEX MICROSCOPIC - Abnormal; Notable for the  following:    Glucose, UA >=500 (*)    All other components within normal limits  CBC  CBG MONITORING, ED  I-STAT TROPOININ, ED    EKG  EKG Interpretation  Date/Time:  Tuesday July 31 2016 07:40:03 EDT Ventricular Rate:  65 PR Interval:  174 QRS Duration: 84 QT Interval:  362 QTC Calculation: 376 R Axis:   76 Text Interpretation:  Normal sinus rhythm Normal ECG Confirmed by Lacretia Leigh ([id number]) on 07/31/2016 7:57:38 AM       Radiology Ct Head Wo Contrast  Result Date: 07/31/2016 CLINICAL DATA:  Frontal headache, lightheaded. EXAM: CT HEAD WITHOUT CONTRAST TECHNIQUE: Contiguous axial images were obtained from the base of the skull through the vertex without intravenous contrast. COMPARISON:  05/12/2014 FINDINGS: Brain: No acute intracranial abnormality. Specifically, no hemorrhage, hydrocephalus, mass lesion, acute infarction, or significant intracranial injury. Vascular: No hyperdense vessel or unexpected calcification. Skull: No acute calvarial abnormality. 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 Sinuses/Orbits: Visualized paranasal sinuses and mastoids clear. Orbital soft tissues unremarkable. Other: None IMPRESSION: No acute intracranial abnormality. Electronically Signed   By: Rolm Baptise M.D.   On: 07/31/2016 09:04    Procedures Procedures (including critical care time)  Medications Ordered in ED Medications - No data to display   Initial Impression / Assessment and Plan / ED Course  I have reviewed the triage vital signs and the nursing notes.  Pertinent labs & imaging results that were available during my care of the patient were reviewed by me and considered in my medical decision making (see chart for details).    [name] is a 66 y.o. male who presents to ED for headache and perioral tingling. Headache has been off-and-on for approx. 2 weeks with perioral tingling occurring for  the first time this morning. On exam, patient with no focal neuro deficits. Afebrile, hemodynamically stable with full muscle strength. Labs, urine and CT head negative. EKG non-ischemic. I ambulated patient and he has steady gait. Evaluation does not show pathology that would require ongoing emergent intervention or inpatient treatment. Patient has PCP whom he has follow up with next week. He understands reasons to return to ED in the meantime. All  questions answered.   Patient seen by and discussed with Dr. Zenia Resides who agrees with treatment plan.   Final Clinical Impressions(s) / ED Diagnoses   Final diagnoses:  Bad headache    New Prescriptions Discharge Medication List as of 07/31/2016 11:12 AM         Ward, Ozella Almond, PA-C 07/31/16 1134

## 2016-10-15 ENCOUNTER — Emergency Department (HOSPITAL_BASED_OUTPATIENT_CLINIC_OR_DEPARTMENT_OTHER): Payer: Medicare Other

## 2016-10-15 ENCOUNTER — Emergency Department (HOSPITAL_BASED_OUTPATIENT_CLINIC_OR_DEPARTMENT_OTHER)
Admission: EM | Admit: 2016-10-15 | Discharge: 2016-10-15 | Disposition: A | Discharge status: Home / Self Care | Payer: Medicare Other | Attending: Emergency Medicine | Admitting: Emergency Medicine

## 2016-10-15 ENCOUNTER — Encounter (HOSPITAL_BASED_OUTPATIENT_CLINIC_OR_DEPARTMENT_OTHER): Payer: Self-pay | Admitting: *Deleted

## 2016-10-15 DIAGNOSIS — E119 Type 2 diabetes mellitus without complications: Secondary | ICD-10-CM | POA: Insufficient documentation

## 2016-10-15 DIAGNOSIS — R0602 Shortness of breath: Secondary | ICD-10-CM | POA: Diagnosis present

## 2016-10-15 DIAGNOSIS — R072 Precordial pain: Secondary | ICD-10-CM

## 2016-10-15 DIAGNOSIS — R0789 Other chest pain: Secondary | ICD-10-CM | POA: Insufficient documentation

## 2016-10-15 DIAGNOSIS — Z7982 Long term (current) use of aspirin: Secondary | ICD-10-CM | POA: Insufficient documentation

## 2016-10-15 DIAGNOSIS — Z8673 Personal history of transient ischemic attack (TIA), and cerebral infarction without residual deficits: Secondary | ICD-10-CM | POA: Insufficient documentation

## 2016-10-15 DIAGNOSIS — I10 Essential (primary) hypertension: Secondary | ICD-10-CM | POA: Diagnosis not present

## 2016-10-15 DIAGNOSIS — Z79899 Other long term (current) drug therapy: Secondary | ICD-10-CM | POA: Insufficient documentation

## 2016-10-15 DIAGNOSIS — Z7984 Long term (current) use of oral hypoglycemic drugs: Secondary | ICD-10-CM | POA: Insufficient documentation

## 2016-10-15 LAB — COMPREHENSIVE METABOLIC PANEL
ALT: 20 U/L (ref 17–63)
ANION GAP: 6 (ref 5–15)
AST: 19 U/L (ref 15–41)
Albumin: 3.9 g/dL (ref 3.5–5.0)
Alkaline Phosphatase: 83 U/L (ref 38–126)
BILIRUBIN TOTAL: 0.7 mg/dL (ref 0.3–1.2)
BUN: 9 mg/dL (ref 6–20)
CO2: 28 mmol/L (ref 22–32)
Calcium: 8.8 mg/dL — ABNORMAL LOW (ref 8.9–10.3)
Chloride: 101 mmol/L (ref 101–111)
Creatinine, Ser: 0.74 mg/dL (ref 0.61–1.24)
GFR calc Af Amer: >60 mL/min (ref >60)
GFR calc non Af Amer: >60 mL/min (ref >60)
GLUCOSE: 137 mg/dL — AB (ref 65–99)
POTASSIUM: 4 mmol/L (ref 3.5–5.1)
SODIUM: 135 mmol/L (ref 135–145)
TOTAL PROTEIN: 7.1 g/dL (ref 6.5–8.1)

## 2016-10-15 LAB — CBC
HCT: 45.8 % (ref 39.0–52.0)
Hemoglobin: 15.9 g/dL (ref 13.0–17.0)
MCH: 31.1 pg (ref 26.0–34.0)
MCHC: 34.7 g/dL (ref 30.0–36.0)
MCV: 89.6 fL (ref 78.0–100.0)
Platelets: 179 10*3/uL (ref 150–400)
RBC: 5.11 MIL/uL (ref 4.22–5.81)
RDW: 13.1 % (ref 11.5–15.5)
WBC: 5.7 10*3/uL (ref 4.0–10.5)

## 2016-10-15 LAB — LIPASE, BLOOD: Lipase: 29 U/L (ref 11–51)

## 2016-10-15 LAB — TROPONIN I: Troponin I: <0.03 ng/mL (ref <0.03)

## 2016-10-15 NOTE — ED Notes (Signed)
Patient transported to X-ray 

## 2016-10-15 NOTE — ED Triage Notes (Signed)
He went to UC this am for not feeling well x 5 days. He is having SOB and chest pain. Drove himself here. Ambulatory without difficulty.

## 2016-10-15 NOTE — ED Provider Notes (Signed)
[facility] DEPT [facility] Provider Note   CSN: [id number] Arrival date & time: 10/15/16  1131     History   Chief Complaint Chief Complaint  Patient presents with  . Shortness of Breath    HPI [name] is a 66 y.o. male.  HPI Patient is a 66 year old male who presents to emergency department complaining of shortness of breath and chest discomfort.  Chest discomfort is constant and located across his anterior chest without other radiation.  They drove himself to the emergency department for further evaluation.  He does have a history of non-insulin-dependent type 2 diabetes as well as esophageal spasm.  He also had normal coronary arteries noted on heart catheterization in 2005 as well as a negative nuclear stress in 2012    Past Medical History:  Diagnosis Date  . Depression   . Diabetes mellitus type 2, noninsulin dependent ([place]) 01/13/2011  . Esophageal spasm   . Essential hypertension   . GERD (gastroesophageal reflux disease) 01/13/2011  . History of cardiac cath    a. 2005: normal cors, normal EF. b. Nuc 12/2010: normal, EF 61%. 2D Echo 12/2010: EF normal (% not given), no RWMA, trivial AI, aortic root trivially dilated, mild MR.  [name] Hyperlipidemia   . 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 Left facial numbness   . Left sided numbness 02/01/2014  . TIA (transient ischemic attack) 02/01/2014  . Left-sided weakness   . Pain in the chest   . Lower abdominal pain 01/31/2014  . Unintentional weight loss 01/31/2014  . Chest pain 01/30/2014  . Headache 03/26/2013  . Essential hypertension   . Chest pain 01/13/2011  . Diabetes mellitus type 2, noninsulin dependent ([place]) 01/13/2011  . GERD (gastroesophageal reflux disease) 01/13/2011  . Hypertensive heart disease without CHF   . Hyperlipidemia   . Depression   . History of prostate cancer 08/22/2004    Past Surgical  History:  Procedure Laterality Date  . CARDIAC CATHETERIZATION  2005   No CAD  . PROSTATECTOMY         Home Medications    Prior to Admission medications   Medication Sig Start Date End Date Taking? Authorizing Provider  acetaminophen (TYLENOL) 325 MG tablet Take 650 mg by mouth every 6 (six) hours as needed for mild pain.    [provider]  aspirin EC 325 MG EC tablet Take 1 tablet (325 mg total) by mouth daily. Patient taking differently: Take 325 mg by mouth every 6 (six) hours as needed for pain.  02/02/14   Geradine Girt, DO  Aspirin-Acetaminophen-Caffeine (GOODY HEADACHE PO) Take 1 packet by mouth daily as needed (headache).    [provider]  Aspirin-Salicylamide-Caffeine (BC HEADACHE POWDER PO) Take 1 packet by mouth daily as needed (headache).    [provider]  carvedilol (COREG) 12.5 MG tablet Take 1 tablet (12.5 mg total) by mouth 2 (two) times daily with a meal. 02/02/14   Geradine Girt, DO  cholecalciferol (VITAMIN D) 1000 units tablet Take 1,000 Units by mouth daily.    [provider]  dicyclomine (BENTYL) 20 MG tablet Take 20 mg by mouth 3 (three) times daily.  01/19/14   [provider]  glimepiride (AMARYL) 4 MG tablet Take 4 mg by mouth daily.     [provider]  JARDIANCE 25 MG TABS tablet Take 25 mg by mouth  every morning. 06/11/16   [provider]  lisinopril (PRINIVIL,ZESTRIL) 10 MG tablet Take 10 mg by mouth daily.  11/20/13   [provider]  metFORMIN (GLUCOPHAGE-XR) 500 MG 24 hr tablet Take 500 mg by mouth daily. 04/26/16   [provider]  Multiple Vitamin (MULTIVITAMIN) tablet Take 1 tablet by mouth 2 (two) times a week.    [provider]  omeprazole (PRILOSEC OTC) 20 MG tablet Take 20 mg by mouth daily.    [provider]  pantoprazole (PROTONIX) 40 MG tablet Take 40 mg by mouth daily.    [provider]  pravastatin (PRAVACHOL) 40 MG tablet Take  40 mg by mouth daily.  12/28/13   [provider]    Family History Family History  Problem Relation Age of Onset  . Colon cancer Father   . Cancer Mother   . Stroke Brother        [place] and hit head, ? stroke  . CAD Brother        Bypass in his 37's.  [name] COPD Sister   . Hypertension Sister     Social History Social History  Substance Use Topics  . Smoking status: Never Smoker  . Smokeless tobacco: Never Used  . Alcohol use No     Allergies   Iohexol   Review of Systems Review of Systems  All other systems reviewed and are negative.    Physical Exam Updated Vital Signs BP 114/79   Pulse 65   Temp 98.1 F (36.7 C) (Oral)   Resp 18   Ht 5' 7.5" (1.715 m)   Wt 65.3 kg (144 lb)   SpO2 100%   BMI 22.22 kg/m   Physical Exam  Constitutional: He is oriented to person, place, and time.  HENT:  Head: Normocephalic and atraumatic.  Eyes: EOM are normal.  Neck: Normal range of motion.  Cardiovascular: Normal rate, regular rhythm and intact distal pulses.   Pulmonary/Chest: Effort normal and breath sounds normal. No respiratory distress.  Abdominal: He exhibits no distension. There is no tenderness.  Musculoskeletal: Normal range of motion.  Neurological: He is alert and oriented to person, place, and time.  Skin: Skin is warm and dry.  Nursing note and vitals reviewed.    ED Treatments / Results  Labs (all labs ordered are listed, but only abnormal results are displayed) Labs Reviewed  COMPREHENSIVE METABOLIC PANEL - Abnormal; Notable for the following:       Result Value   Glucose, Bld 137 (*)    Calcium 8.8 (*)    All other components within normal limits  CBC  LIPASE, BLOOD  TROPONIN I    EKG  EKG Interpretation  Date/Time:  Monday October 15 2016 11:41:57 EDT Ventricular Rate:  64 PR Interval:    QRS Duration: 92 QT Interval:  387 QTC Calculation: 400 R Axis:   54 Text Interpretation:  Sinus rhythm Probable left atrial  enlargement No significant change was found Confirmed by Jola Schmidt [id number]) on 10/15/2016 11:47:24 AM       Radiology Dg Chest 2 View  Result Date: 10/15/2016 CLINICAL DATA:  Chest pain and shortness of breath. EXAM: CHEST  2 VIEW COMPARISON:  01/31/2008 FINDINGS: The heart size and mediastinal contours are within normal limits. Both lungs are clear. The visualized skeletal structures are unremarkable. IMPRESSION: No active cardiopulmonary disease. Electronically Signed   By: Kerby Moors M.D.   On: 10/15/2016 12:04    Procedures Procedures (including critical care  time)  Medications Ordered in ED Medications - No data to display   Initial Impression / Assessment and Plan / ED Course  I have reviewed the triage vital signs and the nursing notes.  Pertinent labs & imaging results that were available during my care of the patient were reviewed by me and considered in my medical decision making (see chart for details).     Workup in the emergency room is without significant abnormality.  Likely musculoskeletal chest pain.  Doubt ACS.  Doubt PE.  EKG without ischemic changes.  Patient understands return the ER for new or worsening symptoms.  Close primary care follow-up  Final Clinical Impressions(s) / ED Diagnoses   Final diagnoses:  Precordial pain    New Prescriptions New Prescriptions   No medications on file     Jola Schmidt, MD 10/17/16 [phone]

## 2017-01-16 ENCOUNTER — Emergency Department (HOSPITAL_COMMUNITY)
Admission: EM | Admit: 2017-01-16 | Discharge: 2017-01-16 | Disposition: A | Discharge status: Home / Self Care | Payer: Medicare Other | Attending: Emergency Medicine | Admitting: Emergency Medicine

## 2017-01-16 ENCOUNTER — Encounter (HOSPITAL_COMMUNITY): Payer: Self-pay | Admitting: Emergency Medicine

## 2017-01-16 ENCOUNTER — Emergency Department (HOSPITAL_COMMUNITY): Payer: Medicare Other

## 2017-01-16 DIAGNOSIS — Z79899 Other long term (current) drug therapy: Secondary | ICD-10-CM | POA: Diagnosis not present

## 2017-01-16 DIAGNOSIS — Z7982 Long term (current) use of aspirin: Secondary | ICD-10-CM | POA: Diagnosis not present

## 2017-01-16 DIAGNOSIS — I10 Essential (primary) hypertension: Secondary | ICD-10-CM | POA: Diagnosis not present

## 2017-01-16 DIAGNOSIS — R072 Precordial pain: Secondary | ICD-10-CM | POA: Diagnosis present

## 2017-01-16 DIAGNOSIS — Z8546 Personal history of malignant neoplasm of prostate: Secondary | ICD-10-CM | POA: Diagnosis not present

## 2017-01-16 DIAGNOSIS — Z8673 Personal history of transient ischemic attack (TIA), and cerebral infarction without residual deficits: Secondary | ICD-10-CM | POA: Diagnosis not present

## 2017-01-16 DIAGNOSIS — Z7984 Long term (current) use of oral hypoglycemic drugs: Secondary | ICD-10-CM | POA: Insufficient documentation

## 2017-01-16 DIAGNOSIS — E119 Type 2 diabetes mellitus without complications: Secondary | ICD-10-CM | POA: Diagnosis not present

## 2017-01-16 LAB — BASIC METABOLIC PANEL
ANION GAP: 9 (ref 5–15)
BUN: 10 mg/dL (ref 6–20)
CALCIUM: 8.7 mg/dL — AB (ref 8.9–10.3)
CHLORIDE: 101 mmol/L (ref 101–111)
CO2: 24 mmol/L (ref 22–32)
Creatinine, Ser: 0.79 mg/dL (ref 0.61–1.24)
GFR calc non Af Amer: >60 mL/min (ref >60)
GLUCOSE: 169 mg/dL — AB (ref 65–99)
POTASSIUM: 3.7 mmol/L (ref 3.5–5.1)
Sodium: 134 mmol/L — ABNORMAL LOW (ref 135–145)

## 2017-01-16 LAB — CBC
HEMATOCRIT: 48.5 % (ref 39.0–52.0)
HEMOGLOBIN: 16.9 g/dL (ref 13.0–17.0)
MCH: 32.2 pg (ref 26.0–34.0)
MCHC: 34.8 g/dL (ref 30.0–36.0)
MCV: 92.4 fL (ref 78.0–100.0)
Platelets: 212 10*3/uL (ref 150–400)
RBC: 5.25 MIL/uL (ref 4.22–5.81)
RDW: 13.3 % (ref 11.5–15.5)
WBC: 6.8 10*3/uL (ref 4.0–10.5)

## 2017-01-16 LAB — I-STAT TROPONIN, ED: TROPONIN I, POC: 0 ng/mL (ref 0.00–0.08)

## 2017-01-16 MED ORDER — IBUPROFEN 400 MG PO TABS
600.0000 mg | ORAL_TABLET | Freq: Once | ORAL | Status: AC
  Administered 2017-01-16: 600 mg via ORAL
  Filled 2017-01-16: qty 1

## 2017-01-16 NOTE — Discharge Instructions (Signed)

## 2017-01-16 NOTE — ED Provider Notes (Signed)
[facility] EMERGENCY DEPARTMENT Provider Note   CSN: [id number] Arrival date & time: 01/16/17  1727     History   Chief Complaint Chief Complaint  Patient presents with  . Chest Pain    HPI [name] is a 66 y.o. male.  The history is provided by the patient.  Chest Pain   This is a new problem. The current episode started 2 days ago. The problem occurs daily. The problem has not changed since onset.The pain is present in the substernal region. The pain is moderate. The pain does not radiate. The symptoms are aggravated by certain positions and deep breathing. Pertinent negatives include no abdominal pain, no cough, no diaphoresis, no fever, no hemoptysis, no lower extremity edema, no nausea, no shortness of breath, no syncope and no vomiting. Risk factors include being elderly.  His past medical history is significant for diabetes and hypertension.   Patient reports central chest pain for over 2 days. Reports it hurts to press on his chest as well as any sort of movement of his upper body. 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 Depression   . Diabetes mellitus type 2, noninsulin dependent ([place]) 01/13/2011  . Esophageal spasm   . Essential hypertension   . GERD (gastroesophageal reflux disease) 01/13/2011  . History of cardiac cath    a. 2005: normal cors, normal EF. b. Nuc 12/2010: normal, EF 61%. 2D Echo 12/2010: EF normal (% not given), no RWMA, trivial AI, aortic root trivially dilated, mild MR.  [name] Hyperlipidemia   . 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 Left  facial numbness   . Left sided numbness 02/01/2014  . TIA (transient ischemic attack) 02/01/2014  . Left-sided weakness   . Pain in the chest   . Lower abdominal pain 01/31/2014  . Unintentional weight loss 01/31/2014  . Chest pain 01/30/2014  . Headache 03/26/2013  . Essential hypertension   . Chest pain 01/13/2011  . Diabetes mellitus type 2, noninsulin dependent ([place]) 01/13/2011  . GERD (gastroesophageal reflux disease) 01/13/2011  . Hypertensive heart disease without CHF   . Hyperlipidemia   . Depression   . History of prostate cancer 08/22/2004    Past Surgical History:  Procedure Laterality Date  . CARDIAC CATHETERIZATION  2005   No CAD  . PROSTATECTOMY         Home Medications    Prior to Admission medications   Medication Sig Start Date End Date Taking? Authorizing Provider  acetaminophen (TYLENOL) 325 MG tablet Take 325-650 mg by mouth every 6 (six) hours as needed for mild pain.    Yes [provider]  aspirin 81 MG chewable tablet Chew 81 mg by mouth daily.   Yes [provider]  Aspirin-Acetaminophen-Caffeine (GOODY HEADACHE PO) Take 1 packet by mouth daily as needed (headache).   Yes [provider]  Aspirin-Salicylamide-Caffeine (BC HEADACHE POWDER PO) Take 1 packet by mouth daily as needed (headache).   Yes [provider]  cholecalciferol (VITAMIN D) 1000 units tablet Take 3,000-4,000 Units by mouth daily as needed (when vitamin  D level is deficient).    Yes [provider]  dicyclomine (BENTYL) 20 MG tablet Take 20 mg by mouth 2 (two) times daily.  01/19/14  Yes [provider]  glimepiride (AMARYL) 4 MG tablet Take 4 mg by mouth daily.    Yes [provider]  JARDIANCE 25 MG TABS tablet Take 25 mg by mouth every morning. 06/11/16  Yes [provider]  lisinopril (PRINIVIL,ZESTRIL) 10 MG tablet Take 10 mg by mouth daily.  11/20/13  Yes [provider]  metFORMIN (GLUCOPHAGE-XR)  500 MG 24 hr tablet Take 500 mg by mouth See admin instructions. 500 mg once a day for elevated BGL when Jardiance is unavailable 04/26/16  Yes [provider]  Multiple Vitamin (MULTIVITAMIN) tablet Take 1 tablet by mouth every other day.    Yes [provider]  omeprazole (PRILOSEC OTC) 20 MG tablet Take 20 mg by mouth daily as needed (for reflux or heartburn).    Yes [provider]  pravastatin (PRAVACHOL) 40 MG tablet Take 40 mg by mouth daily.  12/28/13  Yes [provider]  aspirin EC 325 MG EC tablet Take 1 tablet (325 mg total) by mouth daily. Patient not taking: Reported on 01/16/2017 02/02/14   Geradine Girt, DO  carvedilol (COREG) 12.5 MG tablet Take 1 tablet (12.5 mg total) by mouth 2 (two) times daily with a meal. Patient not taking: Reported on 01/16/2017 02/02/14   Geradine Girt, DO    Family History Family History  Problem Relation Age of Onset  . Colon cancer Father   . Cancer Mother   . Stroke Brother        [place] and hit head, ? stroke  . CAD Brother        Bypass in his 11's.  [name] COPD Sister   . Hypertension Sister     Social History Social History   Tobacco Use  . Smoking status: Never Smoker  . Smokeless tobacco: Never Used  Substance Use Topics  . Alcohol use: No  . Drug use: No     Allergies   Iohexol   Review of Systems Review of Systems  Constitutional: Negative for diaphoresis and fever.  Respiratory: Negative for cough, hemoptysis and shortness of breath.   Cardiovascular: Positive for chest pain. Negative for syncope.  Gastrointestinal: Negative for abdominal pain, nausea and vomiting.  Neurological: Negative for syncope.  All other systems reviewed and are negative.    Physical Exam Updated Vital Signs BP (!) 147/77   Pulse 69   Temp (!) 97.5 F (36.4 C) (Oral)   Resp 15   SpO2 99%   Physical Exam CONSTITUTIONAL: Well developed/well nourished HEAD: Normocephalic/atraumatic EYES:  EOMI/PERRL ENMT: Mucous membranes moist NECK: supple no meningeal signs SPINE/BACK:entire spine nontender CV: S1/S2 noted, no murmurs/rubs/gallops noted LUNGS: Lungs are clear to auscultation bilaterally, no apparent distress Chest - tenderness to central chest, no crepitus or bruising ABDOMEN: soft, nontender, no rebound or guarding, bowel sounds noted throughout abdomen GU:no cva tenderness NEURO: Pt is awake/alert/appropriate, moves all extremitiesx4.  No facial droop.   EXTREMITIES: pulses normal/equal, full ROM, no calf tenderness or edema SKIN: warm, color normal PSYCH: no abnormalities of mood noted, alert and oriented to situation   ED Treatments / Results  Labs (all labs ordered are listed, but only abnormal results are displayed) Labs Reviewed  BASIC METABOLIC PANEL - Abnormal; Notable for the following components:      Result Value   Sodium 134 (*)  Glucose, Bld 169 (*)    Calcium 8.7 (*)    All other components within normal limits  CBC  I-STAT TROPONIN, ED    EKG  EKG Interpretation  Date/Time:  Wednesday January 16 2017 17:32:13 EST Ventricular Rate:  86 PR Interval:  158 QRS Duration: 80 QT Interval:  346 QTC Calculation: 414 R Axis:   63 Text Interpretation:  Sinus rhythm with Premature atrial complexes Otherwise normal ECG No significant change since last tracing Confirmed by Ripley Fraise [phone]) on 01/16/2017 11:10:04 PM       Radiology Dg Chest 2 View  Result Date: 01/16/2017 CLINICAL DATA:  Central chest pain for a while, worsening, history hypertension, diabetes mellitus, prostate cancer EXAM: CHEST  2 VIEW COMPARISON:  10/15/2016 FINDINGS: Normal heart size, mediastinal contours, and pulmonary vascularity. Lungs clear. No pleural effusion or pneumothorax. Bones unremarkable. IMPRESSION: No acute abnormalities. Electronically Signed   By: Lavonia Dana M.D.   On: 01/16/2017 18:07    Procedures Procedures (including critical care  time)  Medications Ordered in ED Medications  ibuprofen (ADVIL,MOTRIN) tablet 600 mg (600 mg Oral Given 01/16/17 2329)     Initial Impression / Assessment and Plan / ED Course  I have reviewed the triage vital signs and the nursing notes.  Pertinent labs & imaging results that were available during my care of the patient were reviewed by me and considered in my medical decision making (see chart for details).     Patient with chest wall pain for over 2 days, worse with palpation and movement He is well-appearing, no distress noted Suspicion for ACS, PE, dissection is low Discussed strict return precautions  Final Clinical Impressions(s) / ED Diagnoses   Final diagnoses:  Precordial pain    ED Discharge Orders    None       Ripley Fraise, MD 01/16/17 2340

## 2017-01-16 NOTE — ED Notes (Signed)
ED Provider at bedside. 

## 2017-01-16 NOTE — ED Notes (Signed)
Pt verbalizes understanding of d/c instructions. Pt ambulatory at d/c with all belongings.   

## 2017-01-16 NOTE — ED Triage Notes (Signed)
Pt to ER for evaluation of 3 days intermittent dull chest pain that is progressively becoming worse. Denies other symptoms. Hx of HTN, hypertensive in triage, all other vitals are stable.

## 2017-02-05 ENCOUNTER — Encounter (INDEPENDENT_AMBULATORY_CARE_PROVIDER_SITE_OTHER): Payer: Medicare Other | Admitting: Ophthalmology

## 2017-02-05 DIAGNOSIS — H43813 Vitreous degeneration, bilateral: Secondary | ICD-10-CM | POA: Diagnosis not present

## 2017-02-05 DIAGNOSIS — I10 Essential (primary) hypertension: Secondary | ICD-10-CM

## 2017-02-05 DIAGNOSIS — H26493 Other secondary cataract, bilateral: Secondary | ICD-10-CM | POA: Diagnosis not present

## 2017-02-05 DIAGNOSIS — H35033 Hypertensive retinopathy, bilateral: Secondary | ICD-10-CM

## 2017-02-05 DIAGNOSIS — H353122 Nonexudative age-related macular degeneration, left eye, intermediate dry stage: Secondary | ICD-10-CM

## 2017-02-23 ENCOUNTER — Other Ambulatory Visit: Payer: Self-pay

## 2017-02-23 ENCOUNTER — Encounter (HOSPITAL_COMMUNITY): Payer: Self-pay | Admitting: Emergency Medicine

## 2017-02-23 ENCOUNTER — Emergency Department (HOSPITAL_COMMUNITY)
Admission: EM | Admit: 2017-02-23 | Discharge: 2017-02-23 | Disposition: A | Discharge status: Home / Self Care | Payer: Medicare Other | Attending: Emergency Medicine | Admitting: Emergency Medicine

## 2017-02-23 ENCOUNTER — Emergency Department (HOSPITAL_COMMUNITY): Payer: Medicare Other

## 2017-02-23 DIAGNOSIS — Z8673 Personal history of transient ischemic attack (TIA), and cerebral infarction without residual deficits: Secondary | ICD-10-CM | POA: Diagnosis not present

## 2017-02-23 DIAGNOSIS — E119 Type 2 diabetes mellitus without complications: Secondary | ICD-10-CM | POA: Insufficient documentation

## 2017-02-23 DIAGNOSIS — Z7982 Long term (current) use of aspirin: Secondary | ICD-10-CM | POA: Diagnosis not present

## 2017-02-23 DIAGNOSIS — Z79899 Other long term (current) drug therapy: Secondary | ICD-10-CM | POA: Diagnosis not present

## 2017-02-23 DIAGNOSIS — Z7984 Long term (current) use of oral hypoglycemic drugs: Secondary | ICD-10-CM | POA: Diagnosis not present

## 2017-02-23 DIAGNOSIS — I10 Essential (primary) hypertension: Secondary | ICD-10-CM | POA: Diagnosis not present

## 2017-02-23 DIAGNOSIS — R0789 Other chest pain: Secondary | ICD-10-CM | POA: Diagnosis not present

## 2017-02-23 DIAGNOSIS — R079 Chest pain, unspecified: Secondary | ICD-10-CM | POA: Diagnosis present

## 2017-02-23 DIAGNOSIS — E785 Hyperlipidemia, unspecified: Secondary | ICD-10-CM | POA: Insufficient documentation

## 2017-02-23 LAB — BASIC METABOLIC PANEL
ANION GAP: 9 (ref 5–15)
BUN: 12 mg/dL (ref 6–20)
CO2: 24 mmol/L (ref 22–32)
Calcium: 8.6 mg/dL — ABNORMAL LOW (ref 8.9–10.3)
Chloride: 103 mmol/L (ref 101–111)
Creatinine, Ser: 0.87 mg/dL (ref 0.61–1.24)
GFR calc Af Amer: >60 mL/min (ref >60)
GLUCOSE: 166 mg/dL — AB (ref 65–99)
POTASSIUM: 5 mmol/L (ref 3.5–5.1)
Sodium: 136 mmol/L (ref 135–145)

## 2017-02-23 LAB — CBC
HCT: 46.3 % (ref 39.0–52.0)
HEMOGLOBIN: 16.1 g/dL (ref 13.0–17.0)
MCH: 31.9 pg (ref 26.0–34.0)
MCHC: 34.8 g/dL (ref 30.0–36.0)
MCV: 91.9 fL (ref 78.0–100.0)
Platelets: 210 10*3/uL (ref 150–400)
RBC: 5.04 MIL/uL (ref 4.22–5.81)
RDW: 13.4 % (ref 11.5–15.5)
WBC: 6.6 10*3/uL (ref 4.0–10.5)

## 2017-02-23 LAB — I-STAT TROPONIN, ED: Troponin i, poc: 0 ng/mL (ref 0.00–0.08)

## 2017-02-23 LAB — TROPONIN I: Troponin I: <0.03 ng/mL (ref <0.03)

## 2017-02-23 NOTE — ED Provider Notes (Signed)
The patient reports to me that he has had chest pain that is been going on since overnight, he reports this is been at least 6 hours but noticed it more around 5:00 this morning, states it was left just under his breast but also in the middle of the chest and radiates to his spine, there is no radiation to the shoulder neck or jaw, no associated shortness of breath coughing fever or swelling of the legs diaphoresis.  He has had multiple stress test in the past, I have looked at the medical record and have reviewed some of these tests which have been normal.  There has been no recent cardiology visits but the patient has no history of exertional symptoms, he works in a Nurse, adult position at a school and states that he never gets chest pain or shortness of breath with any exertion.  He does have high blood pressure diabetes and cholesterol but does not smoke cigarettes.  On exam the patient has clear heart and lung sounds, there is a normal rate, normal rhythm, normal pulses, no edema, no JVD.  EKG reviewed and unremarkable, chest x-ray and labs ordered, likely needs a second troponin prior to discharge, needs close follow-up with cardiology.  I specifically told the patient about close follow-up and he is agreeable.  He did request that we give him follow-up information for outpatient cardiology follow-up.   EKG Interpretation  Date/Time:  Saturday February 23 2017 06:06:17 EST Ventricular Rate:  81 PR Interval:    QRS Duration: 85 QT Interval:  370 QTC Calculation: 430 R Axis:   53 Text Interpretation:  Sinus rhythm Consider left atrial enlargement Confirmed by Thayer Jew [phone]) on 02/23/2017 6:09:18 AM      Medical screening examination/treatment/procedure(s) were conducted as a shared visit with non-physician practitioner(s) and myself.  I personally evaluated the patient during the encounter.  Clinical Impression:   Final diagnoses:  None         Noemi Chapel, MD 02/24/17  [phone]

## 2017-02-23 NOTE — Discharge Instructions (Signed)
Continue aspirin every day.  Return if any problems.

## 2017-02-23 NOTE — ED Provider Notes (Signed)
[facility] EMERGENCY DEPARTMENT Provider Note   CSN: [id number] Arrival date & time: 02/23/17  0602     History   Chief Complaint Chief Complaint  Patient presents with  . Chest Pain    HPI [name] is a 67 y.o. male.  The history is provided by the patient. No language interpreter was used.  Chest Pain   This is a new problem. The current episode started 2 days ago. The problem occurs constantly. The problem has been gradually worsening. The pain is associated with rest. The pain is present in the lateral region. The pain is moderate. The pain does not radiate. The symptoms are aggravated by certain positions. He has tried nothing for the symptoms. The treatment provided no relief.  His past medical history is significant for diabetes, hyperlipidemia and hypertension.    Past Medical History:  Diagnosis Date  . Depression   . Diabetes mellitus type 2, noninsulin dependent ([place]) 01/13/2011  . Esophageal spasm   . Essential hypertension   . GERD (gastroesophageal reflux disease) 01/13/2011  . History of cardiac cath    a. 2005: normal cors, normal EF. b. Nuc 12/2010: normal, EF 61%. 2D Echo 12/2010: EF normal (% not given), no RWMA, trivial AI, aortic root trivially dilated, mild MR.  [name] Hyperlipidemia   . 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 Left facial numbness   . Left sided numbness 02/01/2014  . TIA (transient ischemic attack) 02/01/2014  . Left-sided weakness   . Pain in the chest   . Lower abdominal pain 01/31/2014  . Unintentional weight loss 01/31/2014  . Chest pain 01/30/2014  . Headache 03/26/2013  . Essential hypertension   . Chest pain 01/13/2011  . Diabetes mellitus type 2, noninsulin dependent ([place]) 01/13/2011  . GERD (gastroesophageal reflux disease) 01/13/2011  . Hypertensive heart disease without CHF   . Hyperlipidemia   .  Depression   . History of prostate cancer 08/22/2004    Past Surgical History:  Procedure Laterality Date  . CARDIAC CATHETERIZATION  2005   No CAD  . PROSTATECTOMY         Home Medications    Prior to Admission medications   Medication Sig Start Date End Date Taking? Authorizing Provider  acetaminophen (TYLENOL) 325 MG tablet Take 325-650 mg by mouth every 6 (six) hours as needed for mild pain.     [provider]  aspirin 81 MG chewable tablet Chew 81 mg by mouth daily.    [provider]  aspirin EC 325 MG EC tablet Take 1 tablet (325 mg total) by mouth daily. Patient not taking: Reported on 01/16/2017 02/02/14   Geradine Girt, DO  Aspirin-Acetaminophen-Caffeine (GOODY HEADACHE PO) Take 1 packet by mouth daily as needed (headache).    [provider]  Aspirin-Salicylamide-Caffeine (BC HEADACHE POWDER PO) Take 1 packet by mouth daily as needed (headache).    [provider]  carvedilol (COREG) 12.5 MG tablet Take 1 tablet (12.5 mg total) by mouth 2 (two) times daily with a meal. Patient not taking: Reported on 01/16/2017 02/02/14   Geradine Girt, DO  cholecalciferol (VITAMIN D) 1000 units tablet Take 3,000-4,000 Units by mouth daily as needed (when vitamin D level is deficient).     [provider]  dicyclomine (BENTYL) 20 MG tablet Take 20 mg by mouth 2 (two) times daily.  01/19/14   [provider]  glimepiride (AMARYL) 4 MG tablet Take 4 mg by mouth daily.     [provider]  JARDIANCE 25 MG TABS tablet Take 25 mg by mouth every morning. 06/11/16   [provider]  lisinopril (PRINIVIL,ZESTRIL) 10 MG tablet Take 10 mg by mouth daily.  11/20/13   [provider]  metFORMIN (GLUCOPHAGE-XR) 500 MG 24 hr tablet Take 500 mg by mouth See admin instructions. 500 mg once a day for elevated BGL when Jardiance is unavailable 04/26/16   [provider]  Multiple Vitamin (MULTIVITAMIN) tablet Take 1  tablet by mouth every other day.     [provider]  omeprazole (PRILOSEC OTC) 20 MG tablet Take 20 mg by mouth daily as needed (for reflux or heartburn).     [provider]  pravastatin (PRAVACHOL) 40 MG tablet Take 40 mg by mouth daily.  12/28/13   [provider]    Family History Family History  Problem Relation Age of Onset  . Colon cancer Father   . Cancer Mother   . Stroke Brother        Taylor and hit head, ? stroke  . CAD Brother        Bypass in his 63's.  [name] COPD Sister   . Hypertension Sister     Social History Social History   Tobacco Use  . Smoking status: Never Smoker  . Smokeless tobacco: Never Used  Substance Use Topics  . Alcohol use: No  . Drug use: No     Allergies   Iohexol   Review of Systems Review of Systems  Cardiovascular: Positive for chest pain.  All other systems reviewed and are negative.    Physical Exam Updated Vital Signs BP (!) 143/74 (BP Location: Right Arm)   Pulse 83   Temp 98.8 F (37.1 C) (Oral)   SpO2 99%   Physical Exam  Constitutional: He appears well-developed and well-nourished.  HENT:  Head: Normocephalic and atraumatic.  Eyes: Conjunctivae are normal. Pupils are equal, round, and reactive to light.  Neck: Normal range of motion. Neck supple.  Cardiovascular: Normal rate, regular rhythm and normal pulses.  No murmur heard. Pulmonary/Chest: Effort normal and breath sounds normal. No respiratory distress.  Abdominal: Soft. There is no tenderness.  Musculoskeletal: He exhibits no edema.  Neurological: He is alert.  Skin: Skin is warm and dry.  Psychiatric: He has a normal mood and affect.  Nursing note and vitals reviewed.    ED Treatments / Results  Labs (all labs ordered are listed, but only abnormal results are displayed) Labs Reviewed  BASIC METABOLIC PANEL - Abnormal; Notable for the following components:      Result Value   Glucose, Bld 166 (*)    Calcium 8.6 (*)    All  other components within normal limits  CBC  I-STAT TROPONIN, ED    EKG  EKG Interpretation  Date/Time:  Saturday February 23 2017 06:06:17 EST Ventricular Rate:  81 PR Interval:    QRS Duration: 85 QT Interval:  370 QTC Calculation: 430 R Axis:   53 Text Interpretation:  Sinus rhythm Consider left atrial enlargement Confirmed by Thayer Jew [phone]) on 02/23/2017 6:09:18 AM       Radiology Dg Chest 2 View  Result Date: 02/23/2017 CLINICAL DATA:  Left-sided chest pain radiating to the back. General unwell filling for the past 2 days. EXAM: CHEST  2 VIEW COMPARISON:  01/16/2017 FINDINGS: Mild hyperinflation. Normal  heart size and pulmonary vascularity. No focal airspace disease or consolidation in the lungs. No blunting of costophrenic angles. No pneumothorax. Mediastinal contours appear intact. IMPRESSION: No active cardiopulmonary disease. Electronically Signed   By: Lucienne Capers M.D.   On: 02/23/2017 06:44    Procedures Procedures (including critical care time)  Medications Ordered in ED Medications - No data to display   Initial Impression / Assessment and Plan / ED Course  I have reviewed the triage vital signs and the nursing notes.  Pertinent labs & imaging results that were available during my care of the patient were reviewed by me and considered in my medical decision making (see chart for details).    Pt given nitroglycerin and asa. Pt reports resolution of symptoms.   MDM:  EKg no acute abnormality,  Troponin is negative x2. Chest xray is normal  Pt reports he is feeling better.  Pt advised to follow up with primary care.  He is given referral to Cardiology for evaluation.   Final Clinical Impressions(s) / ED Diagnoses   Final diagnoses:  Other chest pain    ED Discharge Orders    None    An After Visit Summary was printed and given to the patient.   Fransico Meadow, [place] 02/23/17 1044    Noemi Chapel, MD 02/24/17 [phone]

## 2017-02-23 NOTE — ED Triage Notes (Signed)
Per EMS pt watching tv at 0500 this morning with 2/10 chest pain. Unable to describe. Left-sided radiating to back Pt has felt "generally unwell" for the past 2 days. One subliingual nitro took pain from 2 to 1.  No N/V.  324 ASA given.

## 2017-03-05 ENCOUNTER — Ambulatory Visit: Payer: Medicare Other | Admitting: Cardiovascular Disease

## 2017-03-05 ENCOUNTER — Encounter: Payer: Self-pay | Admitting: Cardiovascular Disease

## 2017-03-05 ENCOUNTER — Encounter (INDEPENDENT_AMBULATORY_CARE_PROVIDER_SITE_OTHER): Payer: Medicare Other | Admitting: Ophthalmology

## 2017-03-05 VITALS — BP 124/64 | HR 70 | Ht 68.0 in | Wt 141.8 lb

## 2017-03-05 DIAGNOSIS — R079 Chest pain, unspecified: Secondary | ICD-10-CM | POA: Diagnosis not present

## 2017-03-05 DIAGNOSIS — H2701 Aphakia, right eye: Secondary | ICD-10-CM

## 2017-03-05 DIAGNOSIS — I10 Essential (primary) hypertension: Secondary | ICD-10-CM | POA: Diagnosis not present

## 2017-03-05 NOTE — Patient Instructions (Signed)
Medication Instructions:  Your physician recommends that you continue on your current medications as directed. Please refer to the Current Medication list given to you today.   Labwork: None Ordered   Testing/Procedures: Your physician has requested that you have an exercise stress myoview. For further information please visit [email or web address]. Please follow instruction sheet, as given.   Follow-Up: Your physician recommends that you schedule a follow-up appointment in: as needed with Dr. Acie Fredrickson depending on test results   If you need a refill on your cardiac medications before your next appointment, please call your pharmacy.   Thank you for choosing [facility]! Christen Bame, RN [phone]

## 2017-03-05 NOTE — Progress Notes (Signed)
Cardiology Office Note:    Date:  03/05/2017   ID:  [name], DOB 1950-10-12, MRN [id number]  PCP:  Armanda Heritage, NP  Cardiologist:  No primary care provider on file.   Referring MD: Armanda Heritage, NP   Problem list 1.  Chest pain 2.  Hypertension 3.  Diabetes mellitus 4.  Hyperlipidemia  Chief Complaint  Patient presents with  . Chest Pain    History of Present Illness:    [name] is a 67 y.o. male with a hx of hypertension, diabetes mellitus, hyperlipidemia who is referred from the emergency room for further evaluation of some episodes of chest discomfort.  [name] is a 67 yo who has had chest pain for years. Feels like a pressure like sensation  Seems to be left sided,  Radiates to his back  Also has some left arm numbness - but this is not related to the CP  Does not get any regular exercise  Not related to walking up stairs or bringing in the groceries Pains might last as long as a day or so  Not associated with dyspnea or diaphoresis  Past stress myoivew in 2008 with Dr. Verlon Setting was normal  Over the past week or so, he has been eating lots of [place] fish  ( [employer])  [name], Does not like [place]  Works in Maintenance - typically at [employer]    Past Medical History:  Diagnosis Date  . Depression   . Diabetes mellitus type 2, noninsulin dependent ([place]) 01/13/2011  . Esophageal spasm   . Essential hypertension   . GERD (gastroesophageal reflux disease) 01/13/2011  . History of cardiac cath    a. 2005: normal cors, normal EF. b. Nuc 12/2010: normal, EF 61%. 2D Echo 12/2010: EF normal (% not given), no RWMA, trivial AI, aortic root trivially dilated, mild MR.  [name] Hyperlipidemia   . 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 CARDIAC CATHETERIZATION  2005   No CAD  . PROSTATECTOMY      Current Medications: Current Meds  Medication Sig  .  acetaminophen (TYLENOL) 325 MG tablet Take 325-650 mg by mouth every 6 (six) hours as needed for mild pain.   [name] aspirin 81 MG chewable tablet Chew 81 mg by mouth daily.  . Aspirin-Salicylamide-Caffeine (BC HEADACHE POWDER PO) Take 1 packet by mouth daily as needed (headache).  . cholecalciferol (VITAMIN D) 1000 units tablet Take 3,000-4,000 Units by mouth daily as needed (when vitamin D level is deficient).   [name] dicyclomine (BENTYL) 20 MG tablet Take 20 mg by mouth 2 (two) times daily.   [name] glimepiride (AMARYL) 4 MG tablet Take 4 mg by mouth daily.   [name] JARDIANCE 25 MG TABS tablet Take 25 mg by mouth every morning.  [name] lisinopril (PRINIVIL,ZESTRIL) 10 MG tablet Take 10 mg by mouth daily.   . Multiple Vitamin (MULTIVITAMIN) tablet Take 1 tablet by mouth every other day.   [name] omeprazole (PRILOSEC OTC) 20 MG tablet Take 20 mg by mouth daily as needed (for reflux or heartburn).   . pravastatin (PRAVACHOL) 40 MG tablet Take 40 mg by mouth daily.      Allergies:   Iohexol   Social History   Socioeconomic History  . Marital status: Divorced    Spouse name: None  . Number of children: 5  . Years of education: 69  . Highest education level:  None  Social Needs  . Financial resource strain: None  . Food insecurity - worry: None  . Food insecurity - inability: None  . Transportation needs - medical: None  . Transportation needs - non-medical: None  Occupational History  . Occupation: BUS DRIVER    Employer: [employer]  Tobacco Use  . Smoking status: Never Smoker  . Smokeless tobacco: Never Used  Substance and Sexual Activity  . Alcohol use: No  . Drug use: No  . Sexual activity: None  Other Topics Concern  . None  Social History Narrative   Patient is divorced and lives alone.   Patient has five children.   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     Patient has a high school education.   Patient is right-handed.   Patient drinks two cups of tea  daily.     Family History: The patient's family history includes CAD in his brother; COPD in his sister; Cancer in his mother; Colon cancer in his father; Hypertension in his sister; Stroke in his brother.  ROS:   Please see the history of present illness.     All other systems reviewed and are negative.  EKGs/Labs/Other Studies Reviewed:    The following studies were reviewed today:   EKG:    Feb. 2, 2019:   NSR at 81.  No ST or T wave changes   Recent Labs: 10/15/2016: ALT 20 02/23/2017: BUN 12; Creatinine, Ser 0.87; Hemoglobin 16.1; Platelets 210; Potassium 5.0; Sodium 136  Recent Lipid Panel    Component Value Date/Time   CHOL 130 01/31/2014 0138   TRIG 84 01/31/2014 0138   HDL 28 (L) 01/31/2014 0138   CHOLHDL 4.6 01/31/2014 0138   VLDL 17 01/31/2014 0138   LDLCALC 85 01/31/2014 0138    Physical Exam:    VS:  BP 124/64   Pulse 70   Ht 5\' 8"  (1.727 m)   Wt 141 lb 12.8 oz (64.3 kg)   SpO2 98%   BMI 21.56 kg/m     Wt Readings from Last 3 Encounters:  03/05/17 141 lb 12.8 oz (64.3 kg)  10/15/16 144 lb (65.3 kg)  07/31/16 142 lb (64.4 kg)     GEN:  Pleasant middle aged man, Well nourished, well developed in no acute distress HEENT: Normal NECK: No JVD; No carotid bruits LYMPHATICS: No lymphadenopathy CARDIAC: RR , normal S1S2  RESPIRATORY:  Clear to auscultation without rales, wheezing or rhonchi  ABDOMEN: Soft, non-tender, non-distended MUSCULOSKELETAL:  No edema; No deformity  SKIN: Warm and dry NEUROLOGIC:  Alert and oriented x 3 PSYCHIATRIC:  Normal affect   ASSESSMENT:    No diagnosis found. PLAN:    In order of problems listed above:  1. Chest discomfort: [name]  presents for further evaluation of chest discomfort.  The chest pain is described as a pressure/burning sensation.  It tends to radiate through to his back .  He has some left arm tingling that occurs on occasion but does not necessarily seem to be part of that same symptom complex.  He has  several risk factors for coronary artery disease including hyperlipidemia, hypertension.  I would like to schedule him for an exercise Myoview study. He takes aspirin 81 mg a day.  We will see him as needed depending on results of myoview   Medication Adjustments/Labs and Tests Ordered: Current medicines are reviewed at length with the patient today.  Concerns regarding medicines are outlined above.  No orders of the defined types were placed in this encounter.  No orders of the defined types were placed in this encounter.   Signed, Mertie Moores, MD  03/05/2017 2:07 PM    [place]

## 2017-03-06 ENCOUNTER — Telehealth (HOSPITAL_COMMUNITY): Payer: Self-pay | Admitting: *Deleted

## 2017-03-06 NOTE — Telephone Encounter (Signed)
Left message on voicemail in reference to upcoming appointment scheduled for 03/08/17. Phone number given for a call back so details instructions can be given. [name]

## 2017-03-08 ENCOUNTER — Encounter (HOSPITAL_COMMUNITY): Payer: Medicare Other

## 2017-03-08 ENCOUNTER — Telehealth (HOSPITAL_COMMUNITY): Payer: Self-pay | Admitting: Cardiovascular Disease

## 2017-03-11 NOTE — Telephone Encounter (Signed)
03/08/2017 02:22 PM Phone (Outgoing) [name], [name] (Self) [phone] (H)   Left Message - Called pt and lmsg for him to CB to r/s myoview that was missed on 03/08/17.Marland KitchenRG    By [place], [place] pt and spoke with him about rescheduling his appt that was missed on 03/08/17. Patient apologized and was rescheduled to 2/22 at 11:30am.

## 2017-03-12 ENCOUNTER — Telehealth (HOSPITAL_COMMUNITY): Payer: Self-pay | Admitting: *Deleted

## 2017-03-12 NOTE — Telephone Encounter (Signed)
Patient given detailed instructions per Myocardial Perfusion Study Information Sheet for the test on 03/15/17 at 1130. Patient notified to arrive 15 minutes early and that it is imperative to arrive on time for appointment to keep from having the test rescheduled.  If you need to cancel or reschedule your appointment, please call the office within 24 hours of your appointment. . Patient verbalized understanding.[name], [name]

## 2017-03-15 ENCOUNTER — Ambulatory Visit (HOSPITAL_COMMUNITY): Payer: Medicare Other | Attending: Cardiology

## 2017-03-15 DIAGNOSIS — I10 Essential (primary) hypertension: Secondary | ICD-10-CM | POA: Insufficient documentation

## 2017-03-15 DIAGNOSIS — R079 Chest pain, unspecified: Secondary | ICD-10-CM | POA: Diagnosis present

## 2017-03-15 LAB — MYOCARDIAL PERFUSION IMAGING
CHL CUP NUCLEAR SDS: 1
CHL CUP NUCLEAR SRS: 8
CHL CUP RESTING HR STRESS: 78 {beats}/min
CSEPEDS: 30 s
CSEPHR: 96 %
CSEPPHR: 146 {beats}/min
Estimated workload: 12.5 METS
Exercise duration (min): 10 min
LVDIAVOL: 70 mL (ref 62–150)
LVSYSVOL: 29 mL
MPHR: 151 {beats}/min
RATE: 0.29
SSS: 9
TID: 0.87

## 2017-03-15 MED ORDER — TECHNETIUM TC 99M TETROFOSMIN IV KIT
31.9000 | PACK | Freq: Once | INTRAVENOUS | Status: AC | PRN
  Administered 2017-03-15: 31.9 via INTRAVENOUS
  Filled 2017-03-15: qty 32

## 2017-03-15 MED ORDER — TECHNETIUM TC 99M TETROFOSMIN IV KIT
10.3000 | PACK | Freq: Once | INTRAVENOUS | Status: AC | PRN
  Administered 2017-03-15: 10.3 via INTRAVENOUS
  Filled 2017-03-15: qty 11

## 2017-05-09 ENCOUNTER — Ambulatory Visit: Payer: Medicare Other | Admitting: Nurse Practitioner

## 2017-07-12 ENCOUNTER — Other Ambulatory Visit: Payer: Self-pay | Admitting: Internal Medicine

## 2017-07-12 ENCOUNTER — Ambulatory Visit
Admission: RE | Admit: 2017-07-12 | Discharge: 2017-07-12 | Disposition: A | Discharge status: Home / Self Care | Payer: Medicare Other | Source: Ambulatory Visit | Attending: Internal Medicine | Admitting: Internal Medicine

## 2017-07-12 DIAGNOSIS — R109 Unspecified abdominal pain: Secondary | ICD-10-CM

## 2017-07-12 DIAGNOSIS — M549 Dorsalgia, unspecified: Secondary | ICD-10-CM

## 2017-07-16 ENCOUNTER — Other Ambulatory Visit: Payer: Self-pay | Admitting: Internal Medicine

## 2017-09-12 ENCOUNTER — Ambulatory Visit: Payer: Medicare Other | Admitting: Physician Assistant

## 2017-09-12 ENCOUNTER — Encounter: Payer: Self-pay | Admitting: Physician Assistant

## 2017-09-12 ENCOUNTER — Encounter (INDEPENDENT_AMBULATORY_CARE_PROVIDER_SITE_OTHER): Payer: Self-pay

## 2017-09-12 VITALS — BP 146/80 | HR 62 | Ht 68.0 in | Wt 140.4 lb

## 2017-09-12 DIAGNOSIS — I10 Essential (primary) hypertension: Secondary | ICD-10-CM | POA: Diagnosis not present

## 2017-09-12 DIAGNOSIS — R079 Chest pain, unspecified: Secondary | ICD-10-CM

## 2017-09-12 DIAGNOSIS — E782 Mixed hyperlipidemia: Secondary | ICD-10-CM | POA: Diagnosis not present

## 2017-09-12 MED ORDER — LISINOPRIL 20 MG PO TABS: 20.0000 mg | ORAL_TABLET | Freq: Every day | ORAL | 3 refills | Status: DC

## 2017-09-12 NOTE — Patient Instructions (Addendum)
Medication Instructions:  Your physician has recommended you make the following change in your medication:  1.  INCREASE the Lisinopril to 20 mg taking 1 tablet daily  Labwork: None ordered  Testing/Procedures: None ordered  Follow-Up: Your physician recommends that you schedule a follow-up appointment in: AS NEEDED WITH CARDIOLOGY.  YOU WILL NEED TO F/U WITH YOUR PRIMARY CARE PHYSICIAN REGARDING YOUR BLOOD PRESSURE  Any Other Special Instructions Will Be Listed Below (If Applicable).     If you need a refill on your cardiac medications before your next appointment, please call your pharmacy.

## 2017-09-12 NOTE — Progress Notes (Signed)
Cardiology Office Note    Date:  09/12/2017   ID:  [name], DOB 11/23/1950, MRN [id number]  PCP:  Justin Hatchet, MD  Cardiologist:  Dr. Acie Fredrickson  Chief Complaint: Chest pain   History of Present Illness:   [name] is a 67 y.o. male with hx of HTN, HLD, DM and GERD presents of chest pain.   Patient has hx of chronic chest pain with normal prior work up. Normal coronaries by cath in 2005. Normal nuc in 2012 & 2016.  He continued to have chest pain when last seen by Dr. Acie Fredrickson 02/2017. Follow up stress test was low risk.   Seen by PCP 09/06/17 for acute abdominal pain. Felt likely related to GERD and constipation. Started on Omeprazone 40mg  qd. Due to chest pain he referred to cardiologist.   Added to my schedule for chest pain.  Patient has chronic atypical epigastric pain which is likely related to uncontrolled GERD.  His symptoms are improving since taking omeprazole.  No prior EGD.  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  His symptoms never exacerbated with activity.  Denies associated shortness of breath, nausea, vomiting or radiation of pain.  He has separate back pain.   Past Medical History:  Diagnosis Date  . Depression   . Diabetes mellitus type 2, noninsulin dependent ([place]) 01/13/2011  . Esophageal spasm   . Essential hypertension   . GERD (gastroesophageal reflux disease) 01/13/2011  . History of cardiac cath    a. 2005: normal cors, normal EF. b. Nuc 12/2010: normal, EF 61%. 2D Echo 12/2010: EF normal (% not given), no RWMA, trivial AI, aortic root trivially dilated, mild MR.  [name] Hyperlipidemia   . 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 CARDIAC CATHETERIZATION  2005   No CAD  . PROSTATECTOMY      Current Medications:  Prior to Admission medications   Medication Sig Start Date End Date Taking? Authorizing Provider  acetaminophen (TYLENOL) 325 MG  tablet Take 325-650 mg by mouth every 6 (six) hours as needed for mild pain.     [provider]  aspirin 81 MG chewable tablet Chew 81 mg by mouth daily.    [provider]  carvedilol (COREG) 12.5 MG tablet Take 12.5 mg by mouth 2 (two) times daily. 09/10/17   [provider]  cholecalciferol (VITAMIN D) 1000 units tablet Take 3,000-4,000 Units by mouth daily as needed (when vitamin D level is deficient).     [provider]  dicyclomine (BENTYL) 20 MG tablet Take 20 mg by mouth 2 (two) times daily.  01/19/14   [provider]  glimepiride (AMARYL) 4 MG tablet Take 4 mg by mouth daily.     [provider]  JARDIANCE 25 MG TABS tablet Take 25 mg by mouth every morning. 06/11/16   [provider]  lisinopril (PRINIVIL,ZESTRIL) 10 MG tablet Take 10 mg by mouth daily.  11/20/13   [provider]  meloxicam (MOBIC) 15 MG tablet Take 15 mg by mouth daily as needed for pain. 09/05/17   [provider]  Multiple Vitamin (MULTIVITAMIN) tablet Take 1 tablet by mouth every other day.     [provider]  omeprazole (PRILOSEC OTC) 20 MG tablet Take 20 mg by mouth daily as needed (for reflux or heartburn).     [provider]  pravastatin (PRAVACHOL) 40  MG tablet Take 40 mg by mouth daily.  12/28/13   [provider]    Allergies:   Iohexol   Social History   Socioeconomic History  . Marital status: Divorced    Spouse name: Not on file  . Number of children: 5  . Years of education: 9  . Highest education level: Not on file  Occupational History  . Occupation: BUS DRIVER    Employer: [employer]  Social Needs  . Financial resource strain: Not on file  . Food insecurity:    Worry: Not on file    Inability: Not on file  . Transportation needs:    Medical: Not on file    Non-medical: Not on file  Tobacco Use  . Smoking status: Never Smoker  . Smokeless tobacco: Never Used  Substance and  Sexual Activity  . Alcohol use: No  . Drug use: No  . Sexual activity: Not on file  Lifestyle  . Physical activity:    Days per week: Not on file    Minutes per session: Not on file  . Stress: Not on file  Relationships  . Social connections:    Talks on phone: Not on file    Gets together: Not on file    Attends religious service: Not on file    Active member of club or organization: Not on file    Attends meetings of clubs or organizations: Not on file    Relationship status: Not on file  Other Topics Concern  . Not on file  Social History Narrative   Patient is divorced and lives alone.   Patient has five children.   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     Patient has a high school education.   Patient is right-handed.   Patient drinks two cups of tea daily.     Family History:  The patient's family history includes CAD in his brother; COPD in his sister; Cancer in his mother; Colon cancer in his father; Hypertension in his sister; Stroke in his brother.   ROS:   Please see the history of present illness.    ROS All other systems reviewed and are negative.   PHYSICAL EXAM:   VS:  BP (!) 146/80   Pulse 62   Ht 5\' 8"  (1.727 m)   Wt 140 lb 6.4 oz (63.7 kg)   BMI 21.35 kg/m    GEN: Well nourished, well developed, in no acute distress  HEENT: normal  Neck: no JVD, carotid bruits, or masses Cardiac: RRR; no murmurs, rubs, or gallops,no edema  Respiratory:  clear to auscultation bilaterally, normal work of breathing GI: soft, nontender, nondistended, + BS MS: no deformity or atrophy  Skin: warm and dry, no rash Neuro:  Alert and Oriented x 3, Strength and sensation are intact Psych: euthymic mood, full affect  Wt Readings from Last 3 Encounters:  09/12/17 140 lb 6.4 oz (63.7 kg)  03/05/17 141 lb 12.8 oz (64.3 kg)  10/15/16 144 lb (65.3 kg)      Studies/Labs Reviewed:   EKG:  EKG is ordered today.  The ekg ordered today  demonstrates normal sinus rhythm.  Recent Labs: 10/15/2016: ALT 20 02/23/2017: BUN 12; Creatinine, Ser 0.87; Hemoglobin 16.1; Platelets 210; Potassium 5.0; Sodium 136   Lipid Panel    Component Value Date/Time   CHOL 130 01/31/2014 0138   TRIG 84 01/31/2014 0138   HDL 28 (L) 01/31/2014 0138  CHOLHDL 4.6 01/31/2014 0138   VLDL 17 01/31/2014 0138   LDLCALC 85 01/31/2014 0138    Additional studies/ records that were reviewed today include:   Echocardiogram: 01/2014 Study Conclusions  - Left ventricle: The cavity size was normal. Systolic function was normal. The estimated ejection fraction was in the range of 60% to 65%. Wall motion was normal; there were no regional wall motion abnormalities. Doppler parameters are consistent with abnormal left ventricular relaxation (grade 1 diastolic dysfunction). There was no evidence of elevated ventricular filling pressure by Doppler parameters. - Aortic valve: Trileaflet; normal thickness leaflets. There was trivial regurgitation. - Aortic root: The aortic root was normal in size. - Mitral valve: Structurally normal valve. There was no regurgitation. - Left atrium: The atrium was normal in size. - Right ventricle: Systolic function was normal. - Right atrium: The atrium was normal in size. - Tricuspid valve: There was mild regurgitation. - Pulmonic valve: There was no regurgitation. - Pulmonary arteries: Systolic pressure was within the normal range. - Inferior vena cava: The vessel was normal in size. - Pericardium, extracardiac: There was no pericardial effusion.  Impressions:  - Normal biventricular size and systolic function. Impaired relaxation, normal filling pressures. No significant valvular abnormality.  Myoview 02/2017  Nuclear stress EF: 58%.  Blood pressure demonstrated a normal response to exercise.  There was no ST segment deviation noted during stress.  Defect 1: There is a small defect of  severe severity present in the basal anteroseptal location.  The study is normal.  This is a low risk study.  The left ventricular ejection fraction is normal (55-65%).   Low risk, probably normal stress nuclear study with thinning of the basal septum; no ischemia; EF 58 with normal wall motion.     ASSESSMENT & PLAN:   1.  Chronic chest pain -Atypical for angina.  Does not exacerbated with activity.  Likely related to untreated GERD.  Symptoms improving since on omeprazole.  Recommended GI evaluation if no resolution of symptoms.  Prior cardiac work-up reassuring as described above. -Continue aspirin, statin and beta-blocker.  2.  Hypertension -Blood pressure of 146/80 today.  He says his systolic blood pressure always runs in 140s at home.  Continue Coreg.  Increase lisinopril to 20 mg daily.  3.  Hyperlipidemia -Followed by PCP.  Continue pravastatin.  4.  Diabetes -Per PCP.   Medication Adjustments/Labs and Tests Ordered: Current medicines are reviewed at length with the patient today.  Concerns regarding medicines are outlined above.  Medication changes, Labs and Tests ordered today are listed in the Patient Instructions below. Patient Instructions  Medication Instructions:  Your physician has recommended you make the following change in your medication:  1.  INCREASE the Lisinopril to 20 mg taking 1 tablet daily  Labwork: None ordered  Testing/Procedures: None ordered  Follow-Up: Your physician recommends that you schedule a follow-up appointment in: AS NEEDED WITH CARDIOLOGY.  YOU WILL NEED TO F/U WITH YOUR PRIMARY CARE PHYSICIAN REGARDING YOUR BLOOD PRESSURE  Any Other Special Instructions Will Be Listed Below (If Applicable).     If you need a refill on your cardiac medications before your next appointment, please call your pharmacy.      Jarrett Soho, [place]  09/12/2017 10:36 AM    [facility] [address],  [place], [place]  [address] Phone: [phone]; Fax: [phone]

## 2018-02-25 ENCOUNTER — Telehealth: Payer: Self-pay | Admitting: Physician Assistant

## 2018-02-25 NOTE — Telephone Encounter (Signed)
Spoke with the patient, he stated he has been feeling chest discomfort and burning. The pain is very similar to his symptoms during his OV on 8/22. The patient states that he believes this is his acid reflux. The discomfort usually occurs after eating or a little while after. His PCP has been managing his acid reflux. I advised the patient to call his PCP for recommendations. The patient has a previous appointment on 02/28/18.

## 2018-02-25 NOTE — Telephone Encounter (Signed)
Pt c/o of Chest Pain: STAT if CP now or developed within 24 hours  1. Are you having CP right now? No- pt describes it as tightness, burning and a little pain sometime  2. Are you experiencing any other symptoms (ex. SOB, nausea, vomiting, sweating)? no 3. How long have you been experiencing CP?  About a week  4. Is your CP continuous or coming and going? Coming and going  5. Have you taken Nitroglycerin? Never had any Nitroglycerin- takes an Aspirin- Pt has an appt for this Friday(02-28-18)  With [name] ?

## 2018-02-25 NOTE — Telephone Encounter (Signed)
Agree that this sounds like reflux and that he should call his primary MD

## 2018-02-28 ENCOUNTER — Encounter: Payer: Self-pay | Admitting: Cardiology

## 2018-02-28 ENCOUNTER — Ambulatory Visit: Payer: Medicare Other | Admitting: Cardiology

## 2018-02-28 VITALS — BP 138/70 | HR 67 | Ht 68.0 in | Wt 145.1 lb

## 2018-02-28 DIAGNOSIS — E119 Type 2 diabetes mellitus without complications: Secondary | ICD-10-CM | POA: Diagnosis not present

## 2018-02-28 DIAGNOSIS — I10 Essential (primary) hypertension: Secondary | ICD-10-CM

## 2018-02-28 DIAGNOSIS — E785 Hyperlipidemia, unspecified: Secondary | ICD-10-CM

## 2018-02-28 DIAGNOSIS — R0789 Other chest pain: Secondary | ICD-10-CM

## 2018-02-28 NOTE — Patient Instructions (Signed)
Medication Instructions:  Your physician recommends that you continue on your current medications as directed. Please refer to the Current Medication list given to you today.  If you need a refill on your cardiac medications before your next appointment, please call your pharmacy.   Lab work: None  If you have labs (blood work) drawn today and your tests are completely normal, you will receive your results only by: . MyChart Message (if you have MyChart) OR . A paper copy in the mail If you have any lab test that is abnormal or we need to change your treatment, we will call you to review the results.  Testing/Procedures: None  Follow-Up: At [facility], you and your health needs are our priority.  As part of our continuing mission to provide you with exceptional heart care, we have created designated Provider Care Teams.  These Care Teams include your primary Cardiologist (physician) and Advanced Practice Providers (APPs -  Physician Assistants and Nurse Practitioners) who all work together to provide you with the care you need, when you need it. You will need a follow up appointment in:  6 months.  Please call our office 2 months in advance to schedule this appointment.  You may see Philip Nahser, MD or one of the following Advanced Practice Providers on your designated Care Team: Scott Weaver, PA-C Vin Bhagat, PA-C . Janine Hammond, NP  Any Other Special Instructions Will Be Listed Below (If Applicable).   DASH Eating Plan DASH stands for "Dietary Approaches to Stop Hypertension." The DASH eating plan is a healthy eating plan that has been shown to reduce high blood pressure (hypertension). It may also reduce your risk for type 2 diabetes, heart disease, and stroke. The DASH eating plan may also help with weight loss. What are tips for following this plan?  General guidelines  Avoid eating more than 2,300 mg (milligrams) of salt (sodium) a day. If you have hypertension, you may  need to reduce your sodium intake to 1,500 mg a day.  Limit alcohol intake to no more than 1 drink a day for nonpregnant women and 2 drinks a day for men. One drink equals 12 oz of beer, 5 oz of wine, or 1 oz of hard liquor.  Work with your health care provider to maintain a healthy body weight or to lose weight. Ask what an ideal weight is for you.  Get at least 30 minutes of exercise that causes your heart to beat faster (aerobic exercise) most days of the week. Activities may include walking, swimming, or biking.  Work with your health care provider or diet and nutrition specialist (dietitian) to adjust your eating plan to your individual calorie needs. Reading food labels   Check food labels for the amount of sodium per serving. Choose foods with less than 5 percent of the Daily Value of sodium. Generally, foods with less than 300 mg of sodium per serving fit into this eating plan.  To find whole grains, look for the word "whole" as the first word in the ingredient list. Shopping  Buy products labeled as "low-sodium" or "no salt added."  Buy fresh foods. Avoid canned foods and premade or frozen meals. Cooking  Avoid adding salt when cooking. Use salt-free seasonings or herbs instead of table salt or sea salt. Check with your health care provider or pharmacist before using salt substitutes.  Do not fry foods. Cook foods using healthy methods such as baking, boiling, grilling, and broiling instead.  Cook with heart-healthy   oils, such as olive, canola, soybean, or sunflower oil. Meal planning  Eat a balanced diet that includes: ? 5 or more servings of fruits and vegetables each day. At each meal, try to fill half of your plate with fruits and vegetables. ? Up to 6-8 servings of whole grains each day. ? Less than 6 oz of lean meat, poultry, or fish each day. A 3-oz serving of meat is about the same size as a deck of cards. One egg equals 1 oz. ? 2 servings of low-fat dairy each day.  ? A serving of nuts, seeds, or beans 5 times each week. ? Heart-healthy fats. Healthy fats called Omega-3 fatty acids are found in foods such as flaxseeds and coldwater fish, like sardines, salmon, and mackerel.  Limit how much you eat of the following: ? Canned or prepackaged foods. ? Food that is high in trans fat, such as fried foods. ? Food that is high in saturated fat, such as fatty meat. ? Sweets, desserts, sugary drinks, and other foods with added sugar. ? Full-fat dairy products.  Do not salt foods before eating.  Try to eat at least 2 vegetarian meals each week.  Eat more home-cooked food and less restaurant, buffet, and fast food.  When eating at a restaurant, ask that your food be prepared with less salt or no salt, if possible. What foods are recommended? The items listed may not be a complete list. Talk with your dietitian about what dietary choices are best for you. Grains Whole-grain or whole-wheat bread. Whole-grain or whole-wheat pasta. Brown rice. Oatmeal. Quinoa. Bulgur. Whole-grain and low-sodium cereals. Pita bread. Low-fat, low-sodium crackers. Whole-wheat flour tortillas. Vegetables Fresh or frozen vegetables (raw, steamed, roasted, or grilled). Low-sodium or reduced-sodium tomato and vegetable juice. Low-sodium or reduced-sodium tomato sauce and tomato paste. Low-sodium or reduced-sodium canned vegetables. Fruits All fresh, dried, or frozen fruit. Canned fruit in natural juice (without added sugar). Meat and other protein foods Skinless chicken or turkey. Ground chicken or turkey. Pork with fat trimmed off. Fish and seafood. Egg whites. Dried beans, peas, or lentils. Unsalted nuts, nut butters, and seeds. Unsalted canned beans. Lean cuts of beef with fat trimmed off. Low-sodium, lean deli meat. Dairy Low-fat (1%) or fat-free (skim) milk. Fat-free, low-fat, or reduced-fat cheeses. Nonfat, low-sodium ricotta or cottage cheese. Low-fat or nonfat yogurt. Low-fat,  low-sodium cheese. Fats and oils Soft margarine without trans fats. Vegetable oil. Low-fat, reduced-fat, or light mayonnaise and salad dressings (reduced-sodium). Canola, safflower, olive, soybean, and sunflower oils. Avocado. Seasoning and other foods Herbs. Spices. Seasoning mixes without salt. Unsalted popcorn and pretzels. Fat-free sweets. What foods are not recommended? The items listed may not be a complete list. Talk with your dietitian about what dietary choices are best for you. Grains Baked goods made with fat, such as croissants, muffins, or some breads. Dry pasta or rice meal packs. Vegetables Creamed or fried vegetables. Vegetables in a cheese sauce. Regular canned vegetables (not low-sodium or reduced-sodium). Regular canned tomato sauce and paste (not low-sodium or reduced-sodium). Regular tomato and vegetable juice (not low-sodium or reduced-sodium). Pickles. Olives. Fruits Canned fruit in a light or heavy syrup. Fried fruit. Fruit in cream or butter sauce. Meat and other protein foods Fatty cuts of meat. Ribs. Fried meat. Bacon. Sausage. Bologna and other processed lunch meats. Salami. Fatback. Hotdogs. Bratwurst. Salted nuts and seeds. Canned beans with added salt. Canned or smoked fish. Whole eggs or egg yolks. Chicken or turkey with skin. Dairy Whole or 2% milk,   cream, and half-and-half. Whole or full-fat cream cheese. Whole-fat or sweetened yogurt. Full-fat cheese. Nondairy creamers. Whipped toppings. Processed cheese and cheese spreads. Fats and oils Butter. Stick margarine. Lard. Shortening. Ghee. Bacon fat. Tropical oils, such as coconut, palm kernel, or palm oil. Seasoning and other foods Salted popcorn and pretzels. Onion salt, garlic salt, seasoned salt, table salt, and sea salt. Worcestershire sauce. Tartar sauce. Barbecue sauce. Teriyaki sauce. Soy sauce, including reduced-sodium. Steak sauce. Canned and packaged gravies. Fish sauce. Oyster sauce. Cocktail sauce.  Horseradish that you find on the shelf. Ketchup. Mustard. Meat flavorings and tenderizers. Bouillon cubes. Hot sauce and Tabasco sauce. Premade or packaged marinades. Premade or packaged taco seasonings. Relishes. Regular salad dressings. Where to find more information:  National Heart, Lung, and Blood Institute: www.nhlbi.nih.gov  American Heart Association: www.heart.org Summary  The DASH eating plan is a healthy eating plan that has been shown to reduce high blood pressure (hypertension). It may also reduce your risk for type 2 diabetes, heart disease, and stroke.  With the DASH eating plan, you should limit salt (sodium) intake to 2,300 mg a day. If you have hypertension, you may need to reduce your sodium intake to 1,500 mg a day.  When on the DASH eating plan, aim to eat more fresh fruits and vegetables, whole grains, lean proteins, low-fat dairy, and heart-healthy fats.  Work with your health care provider or diet and nutrition specialist (dietitian) to adjust your eating plan to your individual calorie needs. This information is not intended to replace advice given to you by your health care provider. Make sure you discuss any questions you have with your health care provider. Document Released: 12/28/2010 Document Revised: 01/02/2016 Document Reviewed: 01/02/2016 Elsevier Interactive Patient Education  2019 [employer]  

## 2018-02-28 NOTE — Progress Notes (Signed)
Cardiology Office Note:    Date:  02/28/2018   ID:  [name], DOB 1950-11-02, MRN [id number]  PCP:  Velna Hatchet, MD  Cardiologist:  Mertie Moores, MD  Referring MD: Velna Hatchet, MD   Chief Complaint  Patient presents with  . Chest Pain    History of Present Illness:    [name] is a 68 y.o. male with a past medical history significant for HTN, HLD, DM and GERD.  Patient has a history of chronic chest pain with normal prior work-up.  Normal coronaries by cath in 2005.  Normal nuke in 2012, 2016 and most recently in 02/2017.  Patient was last seen 09/12/2017 by Robbie Lis, PA which time the patient was continuing to have atypical epigastric pain that was felt to be likely related to uncontrolled GERD.  His symptoms seemed to improve with omeprazole.  He was noted to have had no prior EGD.  He was added onto my schedule for complaints of chest pain. He is having a vague "hurt" in his mid/upper chest and in his mid upper back at different times. Not related to activity. Only last for a few seconds. His back pain is reproducible with palpation. His chest pain is not. He has had no DOE.  He says that he just wanted to get checked out.   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 He says that his job is not very physical. He does not do any heavy lifting and does not recall straining any muscles in the chest. He has occ acid reflux. He takes omeprazole daily.   He does a lot of walking at work with no chest pain or shortness of breath with exertion. He plans to start exercising  At [employer].   Past Medical History:  Diagnosis Date  . Depression   . Diabetes mellitus type 2, noninsulin dependent ([place]) 01/13/2011  . Esophageal spasm   . Essential hypertension   . GERD (gastroesophageal reflux disease) 01/13/2011  . History of cardiac cath    a. 2005: normal cors, normal EF. b. Nuc 12/2010: normal, EF 61%. 2D Echo 12/2010: EF normal (% not given), no RWMA,  trivial AI, aortic root trivially dilated, mild MR.  [name] Hyperlipidemia   . 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 CARDIAC CATHETERIZATION  2005   No CAD  . PROSTATECTOMY      Current Medications: Current Meds  Medication Sig  . acetaminophen (TYLENOL) 325 MG tablet Take 325-650 mg by mouth every 6 (six) hours as needed for mild pain.   [name] aspirin 81 MG chewable tablet Chew 81 mg by mouth daily.  . carvedilol (COREG) 12.5 MG tablet Take 12.5 mg by mouth 2 (two) times daily.  . cholecalciferol (VITAMIN D) 1000 units tablet Take 3,000-4,000 Units by mouth daily as needed (when vitamin D level is deficient).   [name] dicyclomine (BENTYL) 20 MG tablet Take 20 mg by mouth 2 (two) times daily.   [name] glimepiride (AMARYL) 4 MG tablet Take 4 mg by mouth daily.   . meloxicam (MOBIC) 15 MG tablet Take 15 mg by mouth daily as needed for pain.  . metFORMIN (GLUCOPHAGE) 500 MG tablet Take 1,000 mg by mouth 2 (two) times daily with a meal.  . Multiple Vitamin (MULTIVITAMIN) tablet Take 1 tablet by mouth every other day.   [name] omeprazole (PRILOSEC OTC) 20 MG tablet  Take 20 mg by mouth daily as needed (for reflux or heartburn).   . pravastatin (PRAVACHOL) 40 MG tablet Take 40 mg by mouth daily.      Allergies:   Iohexol and Jardiance [empagliflozin]   Social History   Socioeconomic History  . Marital status: Divorced    Spouse name: Not on file  . Number of children: 5  . Years of education: 44  . Highest education level: Not on file  Occupational History  . Occupation: BUS DRIVER    Employer: [employer]  Social Needs  . Financial resource strain: Not on file  . Food insecurity:    Worry: Not on file    Inability: Not on file  . Transportation needs:    Medical: Not on file    Non-medical: Not on file  Tobacco Use  . Smoking status: Never Smoker  . Smokeless tobacco: Never Used  Substance and Sexual  Activity  . Alcohol use: No  . Drug use: No  . Sexual activity: Not on file  Lifestyle  . Physical activity:    Days per week: Not on file    Minutes per session: Not on file  . Stress: Not on file  Relationships  . Social connections:    Talks on phone: Not on file    Gets together: Not on file    Attends religious service: Not on file    Active member of club or organization: Not on file    Attends meetings of clubs or organizations: Not on file    Relationship status: Not on file  Other Topics Concern  . Not on file  Social History Narrative   Patient is divorced and lives alone.   Patient has five children.   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     Patient has a high school education.   Patient is right-handed.   Patient drinks two cups of tea daily.     Family History: The patient's family history includes CAD in his brother; COPD in his sister; Cancer in his mother; Colon cancer in his father; Hypertension in his sister; Stroke in his brother. ROS:   Please see the history of present illness.     All other systems reviewed and are negative.  EKGs/Labs/Other Studies Reviewed:    The following studies were reviewed today:  Nuclear stress test 03/15/2017 Study Highlights    Nuclear stress EF: 58%.  Blood pressure demonstrated a normal response to exercise.  There was no ST segment deviation noted during stress.  Defect 1: There is a small defect of severe severity present in the basal anteroseptal location.  The study is normal.  This is a low risk study.  The left ventricular ejection fraction is normal (55-65%).   Low risk, probably normal stress nuclear study with thinning of the basal septum; no ischemia; EF 58 with normal wall motion.       EKG:  EKG is ordered today.  The ekg ordered today demonstrates normal sinus rhythm with moderate voltage criteria for LVH, may be normal variant.  No acute changes  compared to prior.  Recent Labs: No results found for requested labs within last 8760 hours.   Recent Lipid Panel    Component Value Date/Time   CHOL 130 01/31/2014 0138   TRIG 84 01/31/2014 0138   HDL 28 (L) 01/31/2014 0138   CHOLHDL 4.6 01/31/2014 0138   VLDL 17 01/31/2014 0138   LDLCALC 85  01/31/2014 0138    Physical Exam:    VS:  BP 138/70   Pulse 67   Ht 5\' 8"  (1.727 m)   Wt 145 lb 1.9 oz (65.8 kg)   SpO2 99%   BMI 22.07 kg/m     Wt Readings from Last 3 Encounters:  02/28/18 145 lb 1.9 oz (65.8 kg)  09/12/17 140 lb 6.4 oz (63.7 kg)  03/05/17 141 lb 12.8 oz (64.3 kg)     Physical Exam  Constitutional: He is oriented to person, place, and time. He appears well-developed and well-nourished. No distress.  Appears younger than stated age.  HENT:  Head: Normocephalic and atraumatic.  Neck: Normal range of motion. Neck supple. No JVD present.  Cardiovascular: Normal rate, regular rhythm, normal heart sounds and intact distal pulses. Exam reveals no gallop and no friction rub.  No murmur heard. Pulmonary/Chest: Effort normal and breath sounds normal. No respiratory distress. He has no wheezes. He has no rales.  Abdominal: Soft. Bowel sounds are normal.  Musculoskeletal: Normal range of motion.        General: No deformity or edema.  Neurological: He is alert and oriented to person, place, and time.  Skin: Skin is warm and dry.  Psychiatric: He has a normal mood and affect. His behavior is normal. Judgment and thought content normal.  Vitals reviewed.   ASSESSMENT:    1. Other chest pain   2. Essential hypertension   3. Hyperlipidemia, unspecified hyperlipidemia type   4. Diabetes mellitus type 2, noninsulin dependent ([place])    PLAN:    In order of problems listed above:  Chronic chest pain -Prior cardiac work-up reassuring with most recent normal stress test in 02/2017.  In the past his pain was felt to be possibly related to GERD. -Pt with very atypcial  chest pain more consistent with musculoskeletal or GERD. I do not feel that he needs another stress test.   Hypertension -On Coreg, lisinopril increased at visit in 08/2017 -Home BP's 120's-130's with occ up to 140's.  -EKG shows LVH. Discussed importance of Good BP control with Pt.  -Continue current therapy.   Hyperlipidemia -Pravastatin 40 mg daily.  Followed by primary care.LDL 79 on 07/08/17 per [facility].   Diabetes type II -On glimepiride adnmetformin.  He did not tolerate Jardiance due to bladder issues. Management per primary care.  Medication Adjustments/Labs and Tests Ordered: Current medicines are reviewed at length with the patient today.  Concerns regarding medicines are outlined above. Labs and tests ordered and medication changes are outlined in the patient instructions below:  Patient Instructions  Medication Instructions:  Your physician recommends that you continue on your current medications as directed. Please refer to the Current Medication list given to you today.  If you need a refill on your cardiac medications before your next appointment, please call your pharmacy.   Lab work: None  If you have labs (blood work) drawn today and your tests are completely normal, you will receive your results only by: [name] MyChart Message (if you have MyChart) OR . A paper copy in the mail If you have any lab test that is abnormal or we need to change your treatment, we will call you to review the results.  Testing/Procedures: None  Follow-Up: At [facility], you and your health needs are our priority.  As part of our continuing mission to provide you with exceptional heart care, we have created designated Provider Care Teams.  These Care Teams include your primary Cardiologist (  physician) and Advanced Practice Providers (APPs -  Physician Assistants and Nurse Practitioners) who all work together to provide you with the care you need, when you need it. You will need a follow up  appointment in:  6 months.  Please call our office 2 months in advance to schedule this appointment.  You may see Mertie Moores, MD or one of the following Advanced Practice Providers on your designated Care Team: Richardson Dopp, PA-C [place], [place] . Daune Perch, NP  Any Other Special Instructions Will Be Listed Below (If Applicable).   DASH Eating Plan DASH stands for "Dietary Approaches to Stop Hypertension." The DASH eating plan is a healthy eating plan that has been shown to reduce high blood pressure (hypertension). It may also reduce your risk for type 2 diabetes, heart disease, and stroke. The DASH eating plan may also help with weight loss. What are tips for following this plan?  General guidelines  Avoid eating more than 2,300 mg (milligrams) of salt (sodium) a day. If you have hypertension, you may need to reduce your sodium intake to 1,500 mg a day.  Limit alcohol intake to no more than 1 drink a day for nonpregnant women and 2 drinks a day for men. One drink equals 12 oz of beer, 5 oz of wine, or 1 oz of hard liquor.  Work with your health care provider to maintain a healthy body weight or to lose weight. Ask what an ideal weight is for you.  Get at least 30 minutes of exercise that causes your heart to beat faster (aerobic exercise) most days of the week. Activities may include walking, swimming, or biking.  Work with your health care provider or diet and nutrition specialist (dietitian) to adjust your eating plan to your individual calorie needs. Reading food labels   Check food labels for the amount of sodium per serving. Choose foods with less than 5 percent of the Daily Value of sodium. Generally, foods with less than 300 mg of sodium per serving fit into this eating plan.  To find whole grains, look for the word "whole" as the first word in the ingredient list. Shopping  Buy products labeled as "low-sodium" or "no salt added."  Buy fresh foods. Avoid canned  foods and premade or frozen meals. Cooking  Avoid adding salt when cooking. Use salt-free seasonings or herbs instead of table salt or sea salt. Check with your health care provider or pharmacist before using salt substitutes.  Do not fry foods. Cook foods using healthy methods such as baking, boiling, grilling, and broiling instead.  Cook with heart-healthy oils, such as olive, canola, soybean, or sunflower oil. Meal planning  Eat a balanced diet that includes: ? 5 or more servings of fruits and vegetables each day. At each meal, try to fill half of your plate with fruits and vegetables. ? Up to 6-8 servings of whole grains each day. ? Less than 6 oz of lean meat, poultry, or fish each day. A 3-oz serving of meat is about the same size as a deck of cards. One egg equals 1 oz. ? 2 servings of low-fat dairy each day. ? A serving of nuts, seeds, or beans 5 times each week. ? Heart-healthy fats. Healthy fats called Omega-3 fatty acids are found in foods such as flaxseeds and coldwater fish, like sardines, salmon, and mackerel.  Limit how much you eat of the following: ? Canned or prepackaged foods. ? Food that is high in trans fat, such as  fried foods. ? Food that is high in saturated fat, such as fatty meat. ? Sweets, desserts, sugary drinks, and other foods with added sugar. ? Full-fat dairy products.  Do not salt foods before eating.  Try to eat at least 2 vegetarian meals each week.  Eat more home-cooked food and less restaurant, buffet, and fast food.  When eating at a restaurant, ask that your food be prepared with less salt or no salt, if possible. What foods are recommended? The items listed may not be a complete list. Talk with your dietitian about what dietary choices are best for you. Grains Whole-grain or whole-wheat bread. Whole-grain or whole-wheat pasta. Brown rice. [place]. Bulgur. Whole-grain and low-sodium cereals. Pita bread. Low-fat, low-sodium crackers.  Whole-wheat flour tortillas. Vegetables Fresh or frozen vegetables (raw, steamed, roasted, or grilled). Low-sodium or reduced-sodium tomato and vegetable juice. Low-sodium or reduced-sodium tomato sauce and tomato paste. Low-sodium or reduced-sodium canned vegetables. Fruits All fresh, dried, or frozen fruit. Canned fruit in natural juice (without added sugar). Meat and other protein foods Skinless chicken or [place]. Ground chicken or [place]. Pork with fat trimmed off. Fish and seafood. Egg whites. Dried beans, peas, or lentils. Unsalted nuts, nut butters, and seeds. Unsalted canned beans. Lean cuts of beef with fat trimmed off. Low-sodium, lean deli meat. Dairy Low-fat (1%) or fat-free (skim) milk. Fat-free, low-fat, or reduced-fat cheeses. Nonfat, low-sodium ricotta or cottage cheese. Low-fat or nonfat yogurt. Low-fat, low-sodium cheese. Fats and oils Soft margarine without trans fats. Vegetable oil. Low-fat, reduced-fat, or light mayonnaise and salad dressings (reduced-sodium). Canola, safflower, olive, soybean, and sunflower oils. Avocado. Seasoning and other foods Herbs. Spices. Seasoning mixes without salt. Unsalted popcorn and pretzels. Fat-free sweets. What foods are not recommended? The items listed may not be a complete list. Talk with your dietitian about what dietary choices are best for you. Grains Baked goods made with fat, such as croissants, muffins, or some breads. Dry pasta or rice meal packs. Vegetables Creamed or fried vegetables. Vegetables in a cheese sauce. Regular canned vegetables (not low-sodium or reduced-sodium). Regular canned tomato sauce and paste (not low-sodium or reduced-sodium). Regular tomato and vegetable juice (not low-sodium or reduced-sodium). Angie Fava. Olives. Fruits Canned fruit in a light or heavy syrup. Fried fruit. Fruit in cream or butter sauce. Meat and other protein foods Fatty cuts of meat. Ribs. Fried meat. Berniece Salines. Sausage. Bologna and other  processed lunch meats. Salami. Fatback. Hotdogs. Bratwurst. Salted nuts and seeds. Canned beans with added salt. Canned or smoked fish. Whole eggs or egg yolks. Chicken or [place] with skin. Dairy Whole or 2% milk, cream, and half-and-half. Whole or full-fat cream cheese. Whole-fat or sweetened yogurt. Full-fat cheese. Nondairy creamers. Whipped toppings. Processed cheese and cheese spreads. Fats and oils Butter. Stick margarine. Lard. Shortening. Ghee. Bacon fat. Tropical oils, such as coconut, palm kernel, or palm oil. Seasoning and other foods Salted popcorn and pretzels. Onion salt, garlic salt, seasoned salt, table salt, and sea salt. Worcestershire sauce. Tartar sauce. Barbecue sauce. Teriyaki sauce. Soy sauce, including reduced-sodium. Steak sauce. Canned and packaged gravies. Fish sauce. Oyster sauce. Cocktail sauce. Horseradish that you find on the shelf. Ketchup. Mustard. Meat flavorings and tenderizers. Bouillon cubes. Hot sauce and Tabasco sauce. Premade or packaged marinades. Premade or packaged taco seasonings. Relishes. Regular salad dressings. Where to find more information:  National Heart, Lung, and [place]: https://wilson-eaton.com/  American Heart Association: www.heart.org Summary  The DASH eating plan is a healthy eating plan that has been shown to  reduce high blood pressure (hypertension). It may also reduce your risk for type 2 diabetes, heart disease, and stroke.  With the DASH eating plan, you should limit salt (sodium) intake to 2,300 mg a day. If you have hypertension, you may need to reduce your sodium intake to 1,500 mg a day.  When on the DASH eating plan, aim to eat more fresh fruits and vegetables, whole grains, lean proteins, low-fat dairy, and heart-healthy fats.  Work with your health care provider or diet and nutrition specialist (dietitian) to adjust your eating plan to your individual calorie needs. This information is not intended to replace advice given to  you by your health care provider. Make sure you discuss any questions you have with your health care provider. Document Released: 12/28/2010 Document Revised: 01/02/2016 Document Reviewed: 01/02/2016 Elsevier Interactive Patient Education  2019 [place], Daune Perch, NP  02/28/2018 5:03 PM    [facility]

## 2018-03-05 ENCOUNTER — Ambulatory Visit: Payer: Medicare Other | Admitting: Physician Assistant

## 2018-03-06 ENCOUNTER — Encounter (INDEPENDENT_AMBULATORY_CARE_PROVIDER_SITE_OTHER): Payer: Medicare Other | Admitting: Ophthalmology

## 2018-04-25 ENCOUNTER — Telehealth: Payer: Self-pay

## 2018-04-25 ENCOUNTER — Other Ambulatory Visit: Payer: Self-pay

## 2018-04-25 ENCOUNTER — Ambulatory Visit
Admission: RE | Admit: 2018-04-25 | Discharge: 2018-04-25 | Disposition: A | Discharge status: Home / Self Care | Payer: Medicare Other | Source: Ambulatory Visit | Attending: Internal Medicine | Admitting: Internal Medicine

## 2018-04-25 ENCOUNTER — Other Ambulatory Visit: Payer: Self-pay | Admitting: Internal Medicine

## 2018-04-25 DIAGNOSIS — R1032 Left lower quadrant pain: Secondary | ICD-10-CM

## 2018-04-25 DIAGNOSIS — R109 Unspecified abdominal pain: Secondary | ICD-10-CM

## 2018-04-25 DIAGNOSIS — K922 Gastrointestinal hemorrhage, unspecified: Secondary | ICD-10-CM

## 2018-04-25 NOTE — Telephone Encounter (Signed)
Phone call to patient to review instructions for 13 hr prep for CT w/ contrast on 04/28/2018  at 11am. Prescription called into [facility]. Pt aware and verbalized understanding of instructions. Prescription: 10:00pm 04/27/18- 50mg  Prednisone 04:00am 04/28/18- 50mg  Prednisone 10:00am 04/28/18 - 50mg  Prednisone and 50mg  Benadryl.

## 2018-04-28 ENCOUNTER — Ambulatory Visit
Admission: RE | Admit: 2018-04-28 | Discharge: 2018-04-28 | Disposition: A | Discharge status: Home / Self Care | Payer: Medicare Other | Source: Ambulatory Visit | Attending: Internal Medicine | Admitting: Internal Medicine

## 2018-04-28 ENCOUNTER — Other Ambulatory Visit: Payer: Self-pay

## 2018-04-28 MED ORDER — IOPAMIDOL (ISOVUE-300) INJECTION 61%
100.0000 mL | Freq: Once | INTRAVENOUS | Status: AC | PRN
  Administered 2018-04-28: 100 mL via INTRAVENOUS

## 2018-07-11 ENCOUNTER — Other Ambulatory Visit: Payer: Self-pay | Admitting: Urology

## 2018-07-19 ENCOUNTER — Other Ambulatory Visit (HOSPITAL_COMMUNITY)
Admission: RE | Admit: 2018-07-19 | Discharge: 2018-07-19 | Disposition: A | Discharge status: Home / Self Care | Payer: Medicare Other | Source: Ambulatory Visit | Attending: Urology | Admitting: Urology

## 2018-07-19 DIAGNOSIS — Z1159 Encounter for screening for other viral diseases: Secondary | ICD-10-CM | POA: Diagnosis present

## 2018-07-19 LAB — SARS CORONAVIRUS 2 (TAT 6-24 HRS): SARS Coronavirus 2: NEGATIVE

## 2018-07-21 ENCOUNTER — Encounter (HOSPITAL_BASED_OUTPATIENT_CLINIC_OR_DEPARTMENT_OTHER): Payer: Self-pay

## 2018-07-21 ENCOUNTER — Other Ambulatory Visit: Payer: Self-pay

## 2018-07-21 NOTE — Progress Notes (Signed)
SPOKE W/  [place]     SCREENING SYMPTOMS OF COVID 19:   COUGH--NO  RUNNY NOSE--- NO  SORE THROAT---NO  NASAL CONGESTION----NO  SNEEZING----NO  SHORTNESS OF BREATH---NO  DIFFICULTY BREATHING---NO  TEMP >100.0 -----NO  UNEXPLAINED BODY ACHES------NO  CHILLS -------- NO  HEADACHES ---------Yes allergies  LOSS OF SMELL/ TASTE --------NO    HAVE YOU OR ANY FAMILY MEMBER TRAVELLED PAST 14 DAYS OUT OF THE   COUNTY---NO STATE----NO COUNTRY----NO  HAVE YOU OR ANY FAMILY MEMBER BEEN EXPOSED TO ANYONE WITH COVID 19? NO

## 2018-07-21 NOTE — Progress Notes (Signed)
Spoke with:  Trev NPO:  After Midnight, no gum, candy, or mints   Arrival time:  0630AM Labs: Istat 8 (EKG) AM medications: None Pre op orders: Yes Ride home: Gwynneth Macleod (daughter) [phone] or Nawaf Strange [phone]

## 2018-07-23 ENCOUNTER — Ambulatory Visit (HOSPITAL_BASED_OUTPATIENT_CLINIC_OR_DEPARTMENT_OTHER)
Admission: RE | Admit: 2018-07-23 | Discharge: 2018-07-23 | Disposition: A | Discharge status: Home / Self Care | Payer: Medicare Other | Attending: Urology | Admitting: Urology

## 2018-07-23 ENCOUNTER — Encounter (HOSPITAL_BASED_OUTPATIENT_CLINIC_OR_DEPARTMENT_OTHER)
Admission: RE | Disposition: A | Discharge status: Home / Self Care | Payer: Self-pay | Source: Home / Self Care | Attending: Urology

## 2018-07-23 ENCOUNTER — Encounter (HOSPITAL_BASED_OUTPATIENT_CLINIC_OR_DEPARTMENT_OTHER): Payer: Self-pay | Admitting: Emergency Medicine

## 2018-07-23 ENCOUNTER — Ambulatory Visit (HOSPITAL_BASED_OUTPATIENT_CLINIC_OR_DEPARTMENT_OTHER): Payer: Medicare Other | Admitting: Anesthesiology

## 2018-07-23 ENCOUNTER — Other Ambulatory Visit: Payer: Self-pay

## 2018-07-23 DIAGNOSIS — Z79899 Other long term (current) drug therapy: Secondary | ICD-10-CM | POA: Insufficient documentation

## 2018-07-23 DIAGNOSIS — E119 Type 2 diabetes mellitus without complications: Secondary | ICD-10-CM | POA: Insufficient documentation

## 2018-07-23 DIAGNOSIS — I10 Essential (primary) hypertension: Secondary | ICD-10-CM | POA: Diagnosis not present

## 2018-07-23 DIAGNOSIS — K219 Gastro-esophageal reflux disease without esophagitis: Secondary | ICD-10-CM | POA: Insufficient documentation

## 2018-07-23 DIAGNOSIS — N481 Balanitis: Secondary | ICD-10-CM | POA: Diagnosis present

## 2018-07-23 DIAGNOSIS — Z8546 Personal history of malignant neoplasm of prostate: Secondary | ICD-10-CM | POA: Diagnosis not present

## 2018-07-23 DIAGNOSIS — Z7984 Long term (current) use of oral hypoglycemic drugs: Secondary | ICD-10-CM | POA: Insufficient documentation

## 2018-07-23 DIAGNOSIS — E7800 Pure hypercholesterolemia, unspecified: Secondary | ICD-10-CM | POA: Diagnosis not present

## 2018-07-23 DIAGNOSIS — Z7982 Long term (current) use of aspirin: Secondary | ICD-10-CM | POA: Diagnosis not present

## 2018-07-23 HISTORY — DX: Other seasonal allergic rhinitis: J30.2

## 2018-07-23 HISTORY — DX: Diverticulosis of intestine, part unspecified, without perforation or abscess without bleeding: K57.90

## 2018-07-23 HISTORY — DX: Fatty (change of) liver, not elsewhere classified: K76.0

## 2018-07-23 HISTORY — PX: CIRCUMCISION: SHX1350

## 2018-07-23 HISTORY — DX: Presence of spectacles and contact lenses: Z97.3

## 2018-07-23 LAB — POCT I-STAT, CHEM 8
BUN: 26 mg/dL — ABNORMAL HIGH (ref 8–23)
Calcium, Ion: 1.22 mmol/L (ref 1.15–1.40)
Chloride: 101 mmol/L (ref 98–111)
Creatinine, Ser: 1.3 mg/dL — ABNORMAL HIGH (ref 0.61–1.24)
Glucose, Bld: 128 mg/dL — ABNORMAL HIGH (ref 70–99)
HCT: 39 % (ref 39.0–52.0)
Hemoglobin: 13.3 g/dL (ref 13.0–17.0)
Potassium: 3.8 mmol/L (ref 3.5–5.1)
Sodium: 137 mmol/L (ref 135–145)
TCO2: 25 mmol/L (ref 22–32)

## 2018-07-23 LAB — GLUCOSE, CAPILLARY: Glucose-Capillary: 134 mg/dL — ABNORMAL HIGH (ref 70–99)

## 2018-07-23 SURGERY — CIRCUMCISION, ADULT
Anesthesia: General

## 2018-07-23 MED ORDER — PHENYLEPHRINE 40 MCG/ML (10ML) SYRINGE FOR IV PUSH (FOR BLOOD PRESSURE SUPPORT)
PREFILLED_SYRINGE | INTRAVENOUS | Status: DC | PRN
  Administered 2018-07-23: 40 ug via INTRAVENOUS
  Administered 2018-07-23 (×3): 80 ug via INTRAVENOUS

## 2018-07-23 MED ORDER — FENTANYL CITRATE (PF) 100 MCG/2ML IJ SOLN
INTRAMUSCULAR | Status: AC
  Filled 2018-07-23: qty 2

## 2018-07-23 MED ORDER — ARTIFICIAL TEARS OPHTHALMIC OINT
TOPICAL_OINTMENT | OPHTHALMIC | Status: AC
  Filled 2018-07-23: qty 3.5

## 2018-07-23 MED ORDER — HYDROCODONE-ACETAMINOPHEN 5-325 MG PO TABS: 1.0000 | ORAL_TABLET | ORAL | 0 refills | Status: DC | PRN

## 2018-07-23 MED ORDER — BUPIVACAINE HCL (PF) 0.25 % IJ SOLN
INTRAMUSCULAR | Status: DC | PRN
  Administered 2018-07-23: 10 mL

## 2018-07-23 MED ORDER — DEXAMETHASONE SODIUM PHOSPHATE 4 MG/ML IJ SOLN
INTRAMUSCULAR | Status: DC | PRN
  Administered 2018-07-23: 4 mg via INTRAVENOUS

## 2018-07-23 MED ORDER — LIDOCAINE 2% (20 MG/ML) 5 ML SYRINGE
INTRAMUSCULAR | Status: DC | PRN
  Administered 2018-07-23: 100 mg via INTRAVENOUS

## 2018-07-23 MED ORDER — ONDANSETRON HCL 4 MG/2ML IJ SOLN
INTRAMUSCULAR | Status: AC
  Filled 2018-07-23: qty 2

## 2018-07-23 MED ORDER — CEFAZOLIN SODIUM-DEXTROSE 2-4 GM/100ML-% IV SOLN
2.0000 g | INTRAVENOUS | Status: AC
  Administered 2018-07-23: 2 g via INTRAVENOUS
  Filled 2018-07-23: qty 100

## 2018-07-23 MED ORDER — PROPOFOL 10 MG/ML IV BOLUS
INTRAVENOUS | Status: DC | PRN
  Administered 2018-07-23: 150 mg via INTRAVENOUS

## 2018-07-23 MED ORDER — ONDANSETRON HCL 4 MG/2ML IJ SOLN
INTRAMUSCULAR | Status: DC | PRN
  Administered 2018-07-23: 4 mg via INTRAVENOUS

## 2018-07-23 MED ORDER — FENTANYL CITRATE (PF) 100 MCG/2ML IJ SOLN
INTRAMUSCULAR | Status: DC | PRN
  Administered 2018-07-23 (×2): 50 ug via INTRAVENOUS

## 2018-07-23 MED ORDER — ACETAMINOPHEN 500 MG PO TABS
1000.0000 mg | ORAL_TABLET | Freq: Once | ORAL | Status: AC
  Administered 2018-07-23: 08:00:00 1000 mg via ORAL
  Filled 2018-07-23: qty 2

## 2018-07-23 MED ORDER — FENTANYL CITRATE (PF) 100 MCG/2ML IJ SOLN
25.0000 ug | INTRAMUSCULAR | Status: DC | PRN
  Filled 2018-07-23: qty 1

## 2018-07-23 MED ORDER — ACETAMINOPHEN 500 MG PO TABS
ORAL_TABLET | ORAL | Status: AC
  Filled 2018-07-23: qty 2

## 2018-07-23 MED ORDER — CEFAZOLIN SODIUM-DEXTROSE 2-4 GM/100ML-% IV SOLN
INTRAVENOUS | Status: AC
  Filled 2018-07-23: qty 100

## 2018-07-23 MED ORDER — LACTATED RINGERS IV SOLN
INTRAVENOUS | Status: DC
  Administered 2018-07-23: 07:00:00 via INTRAVENOUS
  Filled 2018-07-23: qty 1000

## 2018-07-23 MED ORDER — LIDOCAINE 2% (20 MG/ML) 5 ML SYRINGE
INTRAMUSCULAR | Status: AC
  Filled 2018-07-23: qty 5

## 2018-07-23 MED ORDER — DEXAMETHASONE SODIUM PHOSPHATE 10 MG/ML IJ SOLN
INTRAMUSCULAR | Status: AC
  Filled 2018-07-23: qty 1

## 2018-07-23 MED ORDER — BUPIVACAINE HCL (PF) 0.25 % IJ SOLN
INTRAMUSCULAR | Status: AC
  Filled 2018-07-23: qty 30

## 2018-07-23 MED ORDER — PROPOFOL 10 MG/ML IV BOLUS
INTRAVENOUS | Status: AC
  Filled 2018-07-23: qty 40

## 2018-07-23 SURGICAL SUPPLY — 29 items
BLADE CLIPPER SURG (BLADE) ×2 IMPLANT
BLADE SURG 15 STRL LF DISP TIS (BLADE) ×1 IMPLANT
BLADE SURG 15 STRL SS (BLADE) ×3
BNDG COHESIVE 1X5 TAN STRL LF (GAUZE/BANDAGES/DRESSINGS) ×3 IMPLANT
BNDG CONFORM 2 STRL LF (GAUZE/BANDAGES/DRESSINGS) ×2 IMPLANT
COVER BACK TABLE 60X90IN (DRAPES) ×3 IMPLANT
COVER MAYO STAND STRL (DRAPES) ×3 IMPLANT
COVER WAND RF STERILE (DRAPES) ×4 IMPLANT
DRAPE LAPAROTOMY T 98X78 PEDS (DRAPES) ×2 IMPLANT
ELECT REM PT RETURN 9FT ADLT (ELECTROSURGICAL) ×3
ELECTRODE REM PT RTRN 9FT ADLT (ELECTROSURGICAL) IMPLANT
GAUZE PETROLATUM 1 X8 (GAUZE/BANDAGES/DRESSINGS) ×3 IMPLANT
GAUZE SPONGE 4X4 12PLY STRL (GAUZE/BANDAGES/DRESSINGS) ×2 IMPLANT
GAUZE XEROFORM 1X8 LF (GAUZE/BANDAGES/DRESSINGS) ×2 IMPLANT
GLOVE BIO SURGEON STRL SZ7.5 (GLOVE) ×3 IMPLANT
GOWN STRL REUS W/TWL LRG LVL3 (GOWN DISPOSABLE) ×3 IMPLANT
NDL HYPO 25X1 1.5 SAFETY (NEEDLE) IMPLANT
NEEDLE HYPO 25X1 1.5 SAFETY (NEEDLE) ×3 IMPLANT
NS IRRIG 1000ML POUR BTL (IV SOLUTION) IMPLANT
PACK BASIN DAY SURGERY FS (CUSTOM PROCEDURE TRAY) ×3 IMPLANT
PENCIL BUTTON HOLSTER BLD 10FT (ELECTRODE) ×3 IMPLANT
SUT CHROMIC 3 0 SH 27 (SUTURE) ×6 IMPLANT
SUT SILK 2 0 (SUTURE)
SUT SILK 2-0 18XBRD TIE 12 (SUTURE) IMPLANT
SYR CONTROL 10ML LL (SYRINGE) ×2 IMPLANT
TOWEL OR NON WOVEN STRL DISP B (DISPOSABLE) ×2 IMPLANT
TRAY DSU PREP LF (CUSTOM PROCEDURE TRAY) ×3 IMPLANT
TUBING INSUFFLATION 10FT LAP (TUBING) IMPLANT
WATER STERILE IRR 1000ML POUR (IV SOLUTION) IMPLANT

## 2018-07-23 NOTE — Anesthesia Procedure Notes (Signed)
Procedure Name: LMA Insertion Date/Time: 07/23/2018 8:33 AM Performed by: Freddrick March, MD Pre-anesthesia Checklist: Patient identified, Emergency Drugs available, Suction available and Patient being monitored Patient Re-evaluated:Patient Re-evaluated prior to induction Oxygen Delivery Method: Circle system utilized Preoxygenation: Pre-oxygenation with 100% oxygen Induction Type: IV induction Ventilation: Mask ventilation without difficulty LMA: LMA inserted LMA Size: 4.0 Number of attempts: 1 Airway Equipment and Method: Bite block Placement Confirmation: positive ETCO2 Tube secured with: Tape Dental Injury: Teeth and Oropharynx as per pre-operative assessment

## 2018-07-23 NOTE — Transfer of Care (Signed)
  Last Vitals:  Vitals Value Taken Time  BP 128/79 07/23/18 0915  Temp    Pulse 70 07/23/18 0921  Resp 15 07/23/18 0921  SpO2 100 % 07/23/18 0921  Vitals shown include unvalidated device data.  Last Pain:  Vitals:   07/23/18 0631  TempSrc: Oral      Patients Stated Pain Goal: 4 (07/23/18 0631)  Immediate Anesthesia Transfer of Care Note  Patient: [name]  Procedure(s) Performed: Procedure(s) (LRB): CIRCUMCISION ADULT (N/A)  Patient Location: PACU  Anesthesia Type: General  Level of Consciousness: awake, alert  and oriented  Airway & Oxygen Therapy: Patient Spontanous Breathing and Patient connected to nasal cannula oxygen  Post-op Assessment: Report given to PACU RN and Post -op Vital signs reviewed and stable  Post vital signs: Reviewed and stable  Complications: No apparent anesthesia complications

## 2018-07-23 NOTE — Anesthesia Preprocedure Evaluation (Addendum)
Anesthesia Evaluation  Patient identified by MRN, date of birth, ID band Patient awake    Reviewed: Allergy & Precautions, NPO status , Patient's Chart, lab work & pertinent test results, reviewed documented beta blocker date and time   Airway Mallampati: I  TM Distance: >3 FB Neck ROM: Full    Dental no notable dental hx. (+) Chipped, Dental Advisory Given,    Pulmonary neg pulmonary ROS,    Pulmonary exam normal breath sounds clear to auscultation       Cardiovascular hypertension, Pt. on home beta blockers and Pt. on medications Normal cardiovascular exam Rhythm:Regular Rate:Normal  Stress Test 2019 Nuclear stress EF: 58%. Blood pressure demonstrated a normal response to exercise. There was no ST segment deviation noted during stress. Defect 1: There is a small defect of severe severity present in the basal anteroseptal location. The study is normal. This is a low risk study. The left ventricular ejection fraction is normal (55-65%).   Carotid Doppler 2106 Bilateral - 1% to 39% ICA stenosis. Vertebral artery flow is  antegrade.   Neuro/Psych PSYCHIATRIC DISORDERS Depression    GI/Hepatic Neg liver ROS, GERD  Medicated and Controlled,  Endo/Other  diabetes, Type 2, Oral Hypoglycemic Agents  Renal/GU negative Renal ROS  negative genitourinary   Musculoskeletal negative musculoskeletal ROS (+)   Abdominal   Peds  Hematology negative hematology ROS (+)   Anesthesia Other Findings   Reproductive/Obstetrics                            Anesthesia Physical Anesthesia Plan  ASA: III  Anesthesia Plan: General   Post-op Pain Management:    Induction: Intravenous  PONV Risk Score and Plan: 2 and Ondansetron and Dexamethasone  Airway Management Planned: LMA  Additional Equipment:   Intra-op Plan:   Post-operative Plan: Extubation in OR  Informed Consent: I have reviewed the  patients History and Physical, chart, labs and discussed the procedure including the risks, benefits and alternatives for the proposed anesthesia with the patient or authorized representative who has indicated his/her understanding and acceptance.     Dental advisory given  Plan Discussed with: CRNA  Anesthesia Plan Comments:         Anesthesia Quick Evaluation

## 2018-07-23 NOTE — Anesthesia Postprocedure Evaluation (Signed)
Anesthesia Post Note  Patient: WASIL WOLKE  Procedure(s) Performed: CIRCUMCISION ADULT (N/A )     Patient location during evaluation: PACU Anesthesia Type: General Level of consciousness: awake and alert Pain management: pain level controlled Vital Signs Assessment: post-procedure vital signs reviewed and stable Respiratory status: spontaneous breathing, nonlabored ventilation, respiratory function stable and patient connected to nasal cannula oxygen Cardiovascular status: blood pressure returned to baseline and stable Postop Assessment: no apparent nausea or vomiting Anesthetic complications: no    Last Vitals:  Vitals:   07/23/18 0915 07/23/18 0930  BP: 128/79 139/78  Pulse: 70 65  Resp: 13 11  Temp: 36.9 C   SpO2: 100% 100%    Last Pain:  Vitals:   07/23/18 0930  TempSrc:   PainSc: 0-No pain                 Renee Erb L Linda Grimmer

## 2018-07-23 NOTE — Discharge Instructions (Addendum)
Take the dressing off in 2 days.  If it falls off before this, do not worry about it.  Do not try to put a dressing back on.  You can just apply bacitracin or Neosporin to the stitch line.  If the dressing feels too tight or is hurting, just take the dressing off.  It is not normal to have the feeling of too tight of a dressing.  Call your doctor for:  Fever is greater than 100.5  Severe nausea or vomiting  Increasing pain not controlled by pain medication  Increasing redness or drainage from incisions  The number for questions or concerns is [phone]  Activity level: No lifting greater than 20 pounds (about equal to milk) for the next 2 weeks or until cleared to do so at follow-up appointment.  Otherwise activity as tolerated by comfort level.  Diet: May resume your regular diet as tolerated  Driving: No driving while still taking opiate pain medications (weight at least 6-8 hours after last dose).  No driving if you still sore from surgery as it may limit her ability to react quickly if necessary.   Shower/bath: May shower and get incision wet pad dry immediately following dressing removal.  Do not scrub vigorously for the next 2-3 weeks.  Do not soak incision (ID soaking in bath or swimming) until told he may do so by Dr., as this may promote a wound infection.  Wound care: He may cover wounds with bacitracin or Neosporin.    Follow-up appointments: Follow-up appointment will be scheduled for a wound check.   Post Anesthesia Home Care Instructions  Activity: Get plenty of rest for the remainder of the day. A responsible individual must stay with you for 24 hours following the procedure.  For the next 24 hours, DO NOT: -Drive a car -Paediatric nurse -Drink alcoholic beverages -Take any medication unless instructed by your physician -Make any legal decisions or sign important papers.  Meals: Start with liquid foods such as gelatin or soup. Progress to regular foods  as tolerated. Avoid greasy, spicy, heavy foods. If nausea and/or vomiting occur, drink only clear liquids until the nausea and/or vomiting subsides. Call your physician if vomiting continues.  Special Instructions/Symptoms: Your throat may feel dry or sore from the anesthesia or the breathing tube placed in your throat during surgery. If this causes discomfort, gargle with warm salt water. The discomfort should disappear within 24 hours.

## 2018-07-23 NOTE — Op Note (Signed)
Operative Note  Preoperative diagnosis:  1.  Recurrent balanitis  Postoperative diagnosis: 1.  Recurrent balanitis  Procedure(s): 1.  Circumcision  Surgeon: Link Snuffer, MD  Assistants: None  Anesthesia: General  Complications: None immediate  EBL: 20 cc  Specimens: 1.  None  Drains/Catheters: 1.  None  Intraoperative findings: Foreskin excised  Indication: 68YO male with recurrent balanitis desires circumcision.  Description of procedure:  The patient was identified and consent was obtained.  The patient was taken to the operating room and placed in the supine position.  The patient was placed under general anesthesia.  Perioperative antibiotics were administered.  The patient was placed in supine position.  Patient was prepped and draped in a standard sterile fashion and a timeout was performed.  Perioperative antibiotics were administered.  Foreskin adhesions were released with a hemostat.  The foreskin was then retracted and Betadine was applied to the inner portion of the foreskin the head of the penis.  A marking pen was used to mark out a circumferential line approximate 1 cm proximal to the corona.  A scalpel was used to sharply incise along this line.  The foreskin was then returned to its normal position and another circumferential line was made along the level of the corona with a marking pen.  A scalpel was again used to sharply incise along this line.  A straight clamp was used to clamp the midline of the foreskin.  This was then divided.  The excess foreskin was then removed with Bovie electrocautery taking great care not to come close to the glans.  The foreskin was discarded.  Hemostasis was obtained with Bovie electrocautery.  The skin was then reapproximated with interrupted 3-0 chromic sutures and a dressing was applied.  This concluded the operation.  The patient tolerated the procedure well was stable postoperatively.  Plan: Patient will return in several  weeks for a postoperative check.

## 2018-07-23 NOTE — H&P (Signed)
CC/HPI: CC: urinary incontinence  HPI:  01/02/2018  Patient presents with primary complaint of dysuria as well as an increase in urinary incontinence. He had a prostatectomy about 15 years ago. PSA is undetectable as of June of this year. He states for the past 2 weeks, he has been having an increase in frequency, dysuria, and also having a new onset of an increase in urinary incontinence. He has not taking any antibiotics.   02/14/2018  Patient continues to have urinary incontinence. There is little warning. It is not associated with urgency. He does not have much sensation with the leakage. Urinalysis/urine culture at the last visit was negative. He does have 3+ glucose today and has a history of diabetes. His main complaint is irritation around the glans/foreskin as well as irritation of the scrotum. He uses the cream and this did not help. Doxycycline did not help.   07/04/2018  Patient changed his diabetic medications and also with the pelvic floor physical therapy. His leakage has now nearly resolved. He still wears a pad for occasional urinary leakage. His last PSA was 1 year ago. He also complains about recurrent episodes of balanitis and irritation of his glans. He desires circumcision.     ALLERGIES: Tylenol TABS    MEDICATIONS: Aciphex 20 mg tablet, delayed release 0 Oral  Aspirin 81 MG TABS 1 Oral Daily  Hydrochlorothiazide 25 mg tablet 0 Oral  Lisinopril 5 mg tablet 1 Oral Daily  Metformin Hcl 1,000 mg tablet 2 Oral Daily  Multi-Day Vitamins TABS Oral  Simvastatin 40 mg tablet 0 Oral     GU PSH: Cystoscopy - 02/14/2018    NON-GU PSH: Bmi<30 And >=22 Calc & Docu - 02/17/2018 Doc Meds Verified W/Pt Or Re - 02/17/2018 Pain Neg No Plan - 02/17/2018    GU PMH: Urinary Urgency - 02/17/2018 Phimosis - 02/14/2018 Balanitis - 01/02/2018 Dysuria - 01/02/2018 Incontinence w/o Sensation (Stable) - 01/02/2018, Urinary incontinence without sensory awareness, - 2014 ED due to arterial  insufficiency, Erectile dysfunction due to arterial insufficiency - 2014 Prostate Cancer, Prostate cancer - 2014 Stress Incontinence, Male stress incontinence - 2014      PMH Notes:  1898-01-22 00:00:00 - Note: Normal Routine History And Physical Adult   Diabetes   NON-GU PMH: Muscle weakness (generalized) - 04/14/2018, - 02/17/2018 Other specified disorders of muscle - 04/14/2018, - 02/17/2018 Personal history of other diseases of the circulatory system, History of hypertension - 2014 Personal history of other diseases of the digestive system, History of esophageal reflux - 2014 Personal history of other endocrine, nutritional and metabolic disease, History of hypercholesterolemia - 2014, History of diabetes mellitus, - 2014 Personal history of other mental and behavioral disorders, History of depression - 2014    FAMILY HISTORY: Cancer - Mother Colon Cancer - Father Ischemic Stroke - Sister   SOCIAL HISTORY: Does not pertain to patient.      Notes: Alcohol Use, Marital History - Single, Occupation:, Tobacco Use   REVIEW OF SYSTEMS:    GU Review Male:   Patient reports leakage of urine. Patient denies frequent urination, hard to postpone urination, burning/ pain with urination, get up at night to urinate, stream starts and stops, trouble starting your stream, have to strain to urinate , erection problems, and penile pain.  Gastrointestinal (Upper):   Patient denies nausea, vomiting, and indigestion/ heartburn.  Gastrointestinal (Lower):   Patient denies diarrhea and constipation.  Constitutional:   Patient denies fever, night sweats, weight loss, and fatigue.  Skin:  Patient denies skin rash/ lesion and itching.  Eyes:   Patient denies blurred vision and double vision.  Ears/ Nose/ Throat:   Patient denies sore throat and sinus problems.  Hematologic/Lymphatic:   Patient denies swollen glands and easy bruising.  Cardiovascular:   Patient denies leg swelling and chest pains.   Respiratory:   Patient denies cough and shortness of breath.  Endocrine:   Patient denies excessive thirst.  Musculoskeletal:   Patient denies back pain and joint pain.  Neurological:   Patient denies headaches and dizziness.  Psychologic:   Patient denies depression and anxiety.   VITAL SIGNS:      07/04/2018 11:36 AM  Weight 146 lb / 66.22 kg  BP 137/80 mmHg  Heart Rate 81 /min  Temperature 98.1 F / 36.7 C   GU PHYSICAL EXAMINATION:    Penis: Uncircumcised. No phimosis, but there is evidence of irritation/mild balanitis   MULTI-SYSTEM PHYSICAL EXAMINATION:    Constitutional: Well-nourished. No physical deformities. Normally developed. Good grooming.  Respiratory: No labored breathing, no use of accessory muscles.   Cardiovascular: Normal temperature, adequate perfusion of extremities  Skin: No paleness, no jaundice  Neurologic / Psychiatric: Oriented to time, oriented to place, oriented to person. No depression, no anxiety, no agitation.  Gastrointestinal: No mass, no tenderness, no rigidity, non obese abdomen.  Eyes: Normal conjunctivae. Normal eyelids.  Musculoskeletal: Normal gait and station of head and neck.     PAST DATA REVIEWED:  Source Of History:  Patient  Records Review:   Previous Patient Records   02/24/09 07/08/08 10/02/07 04/04/07 10/21/06 04/22/06 10/04/05 04/12/05  PSA  Total PSA <0.04  <0.04  <0.04  0.01  0.03  0.02  0.01  0.01     PROCEDURES:          Urinalysis w/Scope Dipstick Dipstick Cont'd Micro  Color: Yellow Bilirubin: Neg mg/dL WBC/hpf: 40 - 60/hpf  Appearance: Cloudy Ketones: Neg mg/dL RBC/hpf: NS (Not Seen)  Specific Gravity: 1.015 Blood: Neg ery/uL Bacteria: Rare (0-9/hpf)  pH: <=5.0 Protein: Neg mg/dL Cystals: NS (Not Seen)  Glucose: 3+ mg/dL Urobilinogen: 0.2 mg/dL Casts: NS (Not Seen)    Nitrites: Neg Trichomonas: Not Present    Leukocyte Esterase: Trace leu/uL Mucous: Not Present      Epithelial Cells: NS (Not Seen)      Yeast: NS  (Not Seen)      Sperm: Not Present    ASSESSMENT:      ICD-10 Details  1 GU:   Prostate Cancer - C61   2   Phimosis - N47.1    PLAN:           Orders Labs PSA          Schedule Return Visit/Planned Activity: Next Available Appointment - Schedule Surgery          Document Letter(s):  Created for Patient: Clinical Summary         Notes:   PSA today for prostate cancer, history   Patient desires circumcision for recurrent balanitis. I discussed risks and benefits, and possible side effects including but not limited to bleeding, infection, injury to surrounding structures, wound dehiscence, undesired cosmetic effect.   Cc: Dr. Velna Hatchet, M.D.   Signed by Link Snuffer, III, M.D. on 07/04/18 at 12:08 PM (EDT

## 2018-07-24 ENCOUNTER — Encounter (HOSPITAL_BASED_OUTPATIENT_CLINIC_OR_DEPARTMENT_OTHER): Payer: Self-pay | Admitting: Urology

## 2018-09-10 ENCOUNTER — Encounter: Payer: Self-pay | Admitting: Physician Assistant

## 2018-09-11 ENCOUNTER — Ambulatory Visit (INDEPENDENT_AMBULATORY_CARE_PROVIDER_SITE_OTHER): Payer: Medicare Other | Admitting: Physician Assistant

## 2018-09-11 ENCOUNTER — Other Ambulatory Visit: Payer: Self-pay

## 2018-09-11 ENCOUNTER — Encounter (INDEPENDENT_AMBULATORY_CARE_PROVIDER_SITE_OTHER): Payer: Self-pay

## 2018-09-11 ENCOUNTER — Encounter: Payer: Self-pay | Admitting: Physician Assistant

## 2018-09-11 VITALS — BP 122/60 | HR 80 | Ht 68.0 in | Wt 184.0 lb

## 2018-09-11 DIAGNOSIS — I10 Essential (primary) hypertension: Secondary | ICD-10-CM

## 2018-09-11 DIAGNOSIS — E119 Type 2 diabetes mellitus without complications: Secondary | ICD-10-CM

## 2018-09-11 DIAGNOSIS — E782 Mixed hyperlipidemia: Secondary | ICD-10-CM | POA: Diagnosis not present

## 2018-09-11 DIAGNOSIS — R0789 Other chest pain: Secondary | ICD-10-CM

## 2018-09-11 NOTE — Progress Notes (Signed)
Cardiology Office Note    Date:  09/11/2018   ID:  [name], DOB 1950-02-13, MRN [id number]  PCP:  Velna Hatchet, MD  Cardiologist: Dr. Acie Fredrickson  Chief Complaint: 6 months follow up  History of Present Illness:   [name] is a 68 y.o. male with hx of HTN, HLD, DM and GERD presents for follow up.   Patient has hx of chronic chest pain with normal prior work up. Normal coronaries by cath in 2005. Normal nuc in 2012, 2016 and last in 02/2017. His CP improved on PPI when seen by me in 08/2017.He again had atypical chest pain when last seen by Daune Perch, NP 02/2018.  Here today for follow up. Uses prilosec PRN for GERD. The patient denies nausea, vomiting, fever, chest pain, palpitations, shortness of breath, orthopnea, PND, dizziness, syncope, cough, congestion, abdominal pain, hematochezia, melena, lower extremity edema.  Past Medical History:  Diagnosis Date  . Depression   . Diabetes mellitus type 2, noninsulin dependent ([place]) 01/13/2011  . Diverticulosis   . Esophageal spasm   . Essential hypertension   . Fatty liver   . GERD (gastroesophageal reflux disease) 01/13/2011  . History of cardiac cath    a. 2005: normal cors, normal EF. b. Nuc 12/2010: normal, EF 61%. 2D Echo 12/2010: EF normal (% not given), no RWMA, trivial AI, aortic root trivially dilated, mild MR.  [name] Hyperlipidemia   . Prostate cancer ([place]) 2006   Gleason 7 treated with radical prostatectomy  . Seasonal allergies   . Wears glasses     Past Surgical History:  Procedure Laterality Date  . CARDIAC CATHETERIZATION  2005   No CAD  . CIRCUMCISION N/A 07/23/2018   Procedure: CIRCUMCISION ADULT;  Surgeon: Lucas Mallow, MD;  Location: [facility];  Service: Urology;  Laterality: N/A;  . COLONOSCOPY    . PROSTATECTOMY      Current Medications:  Prior to Admission medications   Medication Sig Start Date End Date Taking? Authorizing Provider  acetaminophen (TYLENOL) 325 MG  tablet Take 325-650 mg by mouth every 6 (six) hours as needed for mild pain.    Yes [provider]  aspirin 81 MG chewable tablet Chew 81 mg by mouth daily.   Yes [provider]  cholecalciferol (VITAMIN D) 1000 units tablet Take 3,000-4,000 Units by mouth daily as needed (when vitamin D level is deficient).    Yes [provider]  diphenhydrAMINE (BENADRYL) 25 mg capsule Take 25 mg by mouth every 6 (six) hours as needed.   Yes [provider]  HYDROcodone-acetaminophen (NORCO) 5-325 MG tablet Take 1 tablet by mouth every 4 (four) hours as needed for moderate pain. 07/23/18  Yes Lucas Mallow, MD  losartan-hydrochlorothiazide (HYZAAR) 100-12.5 MG tablet Take 1 tablet by mouth daily.   Yes [provider]  meloxicam (MOBIC) 15 MG tablet Take 15 mg by mouth daily as needed for pain. 09/05/17  Yes [provider]  metFORMIN (GLUCOPHAGE) 500 MG tablet Take 500 mg by mouth 2 (two) times daily with a meal.    Yes [provider]  Multiple Vitamin (MULTIVITAMIN) tablet Take 1 tablet by mouth every other day.    Yes [provider]  omeprazole (PRILOSEC OTC) 20 MG tablet Take 20 mg by mouth daily as needed (for reflux or heartburn).    Yes [provider]  pravastatin (PRAVACHOL) 40 MG tablet Take 20 mg by mouth daily.  12/28/13  Yes  [provider]    Allergies:   Iohexol and Jardiance [empagliflozin]   Social History   Socioeconomic History  . Marital status: Divorced    Spouse name: Not on file  . Number of children: 5  . Years of education: 36  . Highest education level: Not on file  Occupational History  . Occupation: BUS DRIVER    Employer: [employer]  Social Needs  . Financial resource strain: Not on file  . Food insecurity    Worry: Not on file    Inability: Not on file  . Transportation needs    Medical: Not on file    Non-medical: Not on file  Tobacco Use  . Smoking status: Never  Smoker  . Smokeless tobacco: Never Used  Substance and Sexual Activity  . Alcohol use: No  . Drug use: No  . Sexual activity: Not on file  Lifestyle  . Physical activity    Days per week: Not on file    Minutes per session: Not on file  . Stress: Not on file  Relationships  . Social Herbalist on phone: Not on file    Gets together: Not on file    Attends religious service: Not on file    Active member of club or organization: Not on file    Attends meetings of clubs or organizations: Not on file    Relationship status: Not on file  Other Topics Concern  . Not on file  Social History Narrative   Patient is divorced and lives alone.   Patient has five children.   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     Patient has a high school education.   Patient is right-handed.   Patient drinks two cups of tea daily.     Family History:  The patient's family history includes CAD in his brother; COPD in his sister; Cancer in his mother; Colon cancer in his father; Hypertension in his sister; Stroke in his brother.   ROS:   Please see the history of present illness.    ROS All other systems reviewed and are negative.   PHYSICAL EXAM:   VS:  BP 122/60   Pulse 80   Ht 5\' 8"  (1.727 m)   Wt 184 lb (83.5 kg)   SpO2 99%   BMI 27.98 kg/m    GEN: Well nourished, well developed, in no acute distress  HEENT: normal  Neck: no JVD, carotid bruits, or masses Cardiac: RRR; no murmurs, rubs, or gallops,no edema  Respiratory:  clear to auscultation bilaterally, normal work of breathing GI: soft, nontender, nondistended, + BS MS: no deformity or atrophy  Skin: warm and dry, no rash Neuro:  Alert and Oriented x 3, Strength and sensation are intact Psych: euthymic mood, full affect  Wt Readings from Last 3 Encounters:  09/11/18 184 lb (83.5 kg)  07/23/18 141 lb (64 kg)  02/28/18 145 lb 1.9 oz (65.8 kg)      Studies/Labs Reviewed:   EKG:   EKG is not ordered today.    Recent Labs: 07/23/2018: BUN 26; Creatinine, Ser 1.30; Hemoglobin 13.3; Potassium 3.8; Sodium 137   Lipid Panel    Component Value Date/Time   CHOL 130 01/31/2014 0138   TRIG 84 01/31/2014 0138   HDL 28 (L) 01/31/2014 0138   CHOLHDL 4.6 01/31/2014 0138   VLDL 17 01/31/2014 0138   LDLCALC 85 01/31/2014 0138    Additional studies/  records that were reviewed today include:   As summarized above    ASSESSMENT & PLAN:    1. HTN BP stable current medicaitons  2. HLD - on statin. Followed by PCP  3. Chronic chest pain - reassuring prior work up as above. No recurrent episode on PPI.   Medication Adjustments/Labs and Tests Ordered: Current medicines are reviewed at length with the patient today.  Concerns regarding medicines are outlined above.  Medication changes, Labs and Tests ordered today are listed in the Patient Instructions below. There are no Patient Instructions on file for this visit.   Jarrett Soho, [place]  09/11/2018 2:06 PM    [facility] [address], [place], [place]  [address] Phone: [phone]; Fax: [phone]

## 2018-09-11 NOTE — Patient Instructions (Signed)
Medication Instructions:  Your physician recommends that you continue on your current medications as directed. Please refer to the Current Medication list given to you today.  If you need a refill on your cardiac medications before your next appointment, please call your pharmacy.   Lab work: None ordered  If you have labs (blood work) drawn today and your tests are completely normal, you will receive your results only by: . MyChart Message (if you have MyChart) OR . A paper copy in the mail If you have any lab test that is abnormal or we need to change your treatment, we will call you to review the results.  Testing/Procedures: None ordered   Follow-Up: At [facility], you and your health needs are our priority.  As part of our continuing mission to provide you with exceptional heart care, we have created designated Provider Care Teams.  These Care Teams include your primary Cardiologist (physician) and Advanced Practice Providers (APPs -  Physician Assistants and Nurse Practitioners) who all work together to provide you with the care you need, when you need it. You will need a follow up appointment in:  12 months.  Please call our office 2 months in advance to schedule this appointment.  You may see Philip Nahser, MD or one of the following Advanced Practice Providers on your designated Care Team: Scott Weaver, PA-C Vin Bhagat, PA-C . Janine Hammond, NP  Any Other Special Instructions Will Be Listed Below (If Applicable).    

## 2019-01-01 ENCOUNTER — Other Ambulatory Visit: Payer: Medicare Other

## 2019-01-01 ENCOUNTER — Ambulatory Visit
Admission: RE | Admit: 2019-01-01 | Discharge: 2019-01-01 | Disposition: A | Discharge status: Home / Self Care | Payer: Medicare Other | Source: Ambulatory Visit | Attending: Internal Medicine | Admitting: Internal Medicine

## 2019-01-01 ENCOUNTER — Other Ambulatory Visit: Payer: Self-pay | Admitting: Internal Medicine

## 2019-01-01 DIAGNOSIS — R319 Hematuria, unspecified: Secondary | ICD-10-CM

## 2019-01-01 DIAGNOSIS — R109 Unspecified abdominal pain: Secondary | ICD-10-CM

## 2019-01-02 ENCOUNTER — Other Ambulatory Visit: Payer: Self-pay | Admitting: Internal Medicine

## 2019-01-02 DIAGNOSIS — R911 Solitary pulmonary nodule: Secondary | ICD-10-CM

## 2019-03-21 ENCOUNTER — Ambulatory Visit: Payer: Medicare Other | Attending: Internal Medicine

## 2019-03-21 DIAGNOSIS — Z23 Encounter for immunization: Secondary | ICD-10-CM | POA: Insufficient documentation

## 2019-03-21 NOTE — Progress Notes (Signed)
   Covid-19 Vaccination Clinic  Name:  [name]    MRN: [id number] DOB: 1950-10-23  03/21/2019  [name] was observed post Covid-19 immunization for 15 minutes without incidence. He was provided with Vaccine Information Sheet and instruction to access the V-Safe system.   [name] was instructed to call 911 with any severe reactions post vaccine: [name] Difficulty breathing  . Swelling of your face and throat  . A fast heartbeat  . A bad rash all over your body  . Dizziness and weakness    Immunizations Administered    Name Date Dose VIS Date Route   Pfizer COVID-19 Vaccine 03/21/2019  2:20 PM 0.3 mL 01/02/2019 Intramuscular   Manufacturer: [place]   Lot: [id number]   [place]: [id number]

## 2019-04-11 ENCOUNTER — Ambulatory Visit: Payer: Medicare Other | Attending: Internal Medicine

## 2019-04-11 DIAGNOSIS — Z23 Encounter for immunization: Secondary | ICD-10-CM

## 2019-04-11 NOTE — Progress Notes (Signed)
   Covid-19 Vaccination Clinic  Name:  HALLARD MCCULLAR    MRN: [id number] DOB: January 25, 1950  04/11/2019  Mr. Vangorden was observed post Covid-19 immunization for 15 minutes without incident. He was provided with Vaccine Information Sheet and instruction to access the V-Safe system.   Mr. Packwood was instructed to call 911 with any severe reactions post vaccine: [name] Difficulty breathing  . Swelling of face and throat  . A fast heartbeat  . A bad rash all over body  . Dizziness and weakness   Immunizations Administered    Name Date Dose VIS Date Route   Pfizer COVID-19 Vaccine 04/11/2019  8:12 AM 0.3 mL 01/02/2019 Intramuscular   Manufacturer: [place]   Lot: [id number]   [place]: [id number]

## 2019-05-07 ENCOUNTER — Other Ambulatory Visit: Payer: Self-pay | Admitting: Internal Medicine

## 2019-05-07 DIAGNOSIS — R911 Solitary pulmonary nodule: Secondary | ICD-10-CM

## 2019-05-28 ENCOUNTER — Other Ambulatory Visit: Payer: Self-pay

## 2019-05-28 ENCOUNTER — Ambulatory Visit
Admission: RE | Admit: 2019-05-28 | Discharge: 2019-05-28 | Disposition: A | Discharge status: Home / Self Care | Payer: Medicare Other | Source: Ambulatory Visit | Attending: Internal Medicine | Admitting: Internal Medicine

## 2019-05-28 DIAGNOSIS — R911 Solitary pulmonary nodule: Secondary | ICD-10-CM

## 2019-11-12 DIAGNOSIS — M5030 Other cervical disc degeneration, unspecified cervical region: Secondary | ICD-10-CM

## 2019-11-12 DIAGNOSIS — M5412 Radiculopathy, cervical region: Secondary | ICD-10-CM

## 2019-11-12 HISTORY — DX: Radiculopathy, cervical region: M54.12

## 2019-11-12 HISTORY — DX: Other cervical disc degeneration, unspecified cervical region: M50.30

## 2020-02-21 ENCOUNTER — Encounter: Payer: Self-pay | Admitting: Cardiovascular Disease

## 2020-02-21 NOTE — Progress Notes (Signed)
No show   This encounter was created in error - please disregard.  This encounter was created in error - please disregard.

## 2020-02-22 ENCOUNTER — Encounter: Payer: Medicare Other | Admitting: Cardiovascular Disease

## 2020-11-21 ENCOUNTER — Other Ambulatory Visit: Payer: Self-pay | Admitting: Student

## 2020-11-21 DIAGNOSIS — R109 Unspecified abdominal pain: Secondary | ICD-10-CM

## 2020-11-22 ENCOUNTER — Inpatient Hospital Stay: Admission: RE | Admit: 2020-11-22 | Payer: Medicare Other | Source: Ambulatory Visit

## 2020-11-25 ENCOUNTER — Ambulatory Visit
Admission: RE | Admit: 2020-11-25 | Discharge: 2020-11-25 | Disposition: A | Discharge status: Home / Self Care | Payer: Medicare Other | Source: Ambulatory Visit | Attending: Student | Admitting: Student

## 2020-11-25 DIAGNOSIS — R109 Unspecified abdominal pain: Secondary | ICD-10-CM

## 2020-11-25 MED ORDER — IOPAMIDOL (ISOVUE-300) INJECTION 61%
100.0000 mL | Freq: Once | INTRAVENOUS | Status: AC | PRN
  Administered 2020-11-25: 100 mL via INTRAVENOUS

## 2020-11-30 ENCOUNTER — Other Ambulatory Visit: Payer: Self-pay | Admitting: Student

## 2020-11-30 DIAGNOSIS — R109 Unspecified abdominal pain: Secondary | ICD-10-CM

## 2021-04-17 ENCOUNTER — Ambulatory Visit: Payer: Medicare Other | Admitting: Physician Assistant

## 2021-04-17 ENCOUNTER — Encounter: Payer: Self-pay | Admitting: Nurse Practitioner

## 2021-04-26 NOTE — Progress Notes (Signed)
? ? ? ?04/28/2021 ?[name] ?[id number] ?20-Mar-1950 ? ? ?CHIEF COMPLAINT: Abdominal pain  ? ?HISTORY OF PRESENT ILLNESS: [name] is a 71 year old male with a past medical history of depression, hypertension, DM II, TIA, atypical chest pain with normal cardiac cath in 2005 and normal nuclear stress test 2012, 2016 and 02/2017, prostate cancer s/p prostatectomy 2006, fatty liver, GERD, diverticulosis and colon polyps. He presents to our office today as referred by Hilda Blades NP for further evaluation regarding epigastric burning pain. H. Pylori breath test was negative and he was prescribed Pantoprazole '40mg'$  QD 11/2020. He is a challenging historian.  He doesn't recall having any upper abdominal pain but complains of having central lower abdominal pain for the past 4 months which is fairly constant. He sometimes feels a knot to his central lower abdominal area. His lower back pain radiates to his mid back. No N/V.  CTAP 11/2020 showed diverticulosis without evidence of diverticulitis. He has has chronic constipation, passes a hard dark brown stool once daily. No rectal bleeding or black stools. He reported undergoing a colonoscopy a few colonoscopies in the past, his last colonoscopy done by [facility] was about 10 years ago and he thinks one polyp was removed during this procedure. Father with history of colon cancer diagnosed in his 42's. He has heartburn once weekly which is relieved by taking Pepto bismol. No dysphagia.  ? ? ?Past Medical History:  ?Diagnosis Date  ? Depression   ? Diabetes mellitus type 2, noninsulin dependent ([place]) 01/13/2011  ? Diverticulosis   ? Esophageal spasm   ? Essential hypertension   ? Fatty liver   ? GERD (gastroesophageal reflux disease) 01/13/2011  ? History of cardiac cath   ? a. 2005: normal cors, normal EF. b. Nuc 12/2010: normal, EF 61%. 2D Echo 12/2010: EF normal (% not given), no RWMA, trivial AI, aortic root trivially dilated, mild MR.  ? Hyperlipidemia   ? Prostate  cancer [facility]) 2006  ? Gleason 7 treated with radical prostatectomy  ? Seasonal allergies   ? Wears glasses   ? ?Past Surgical History:  ?Procedure Laterality Date  ? CARDIAC CATHETERIZATION  2005  ? No CAD  ? CIRCUMCISION N/A 07/23/2018  ? Procedure: CIRCUMCISION ADULT;  Surgeon: Lucas Mallow, MD;  [facility];  Service: Urology;  Laterality: N/A;  ? COLONOSCOPY    ? PROSTATECTOMY    ? ?Social History: He has one son and four daughters. He is a bus Geophysicist/field seismologist. Nonsmoker. No alcohol. No drug use.  ? ?Family History: family history includes CAD in his brother; COPD in his sister; Cancer in his mother; Colon cancer in his father; Hypertension in his sister; Stroke in his brother. ? ?Allergies  ?Allergen Reactions  ? Iohexol Nausea And Vomiting  ?  Vomits immediately after IV contrast  ? Jardiance [Empagliflozin]   ?  Skin irritation and urinary leakage  ? ? ?  ?Outpatient Encounter Medications as of 04/27/2021  ?Medication Sig  ? acetaminophen (TYLENOL) 325 MG tablet Take 325-650 mg by mouth every 6 (six) hours as needed for mild pain.   ? amLODipine (NORVASC) 5 MG tablet amlodipine 5 mg tablet ? TAKE 1 TABLET BY MOUTH EVERY DAY FOR BLOOD PRESSURE  ? cholecalciferol (VITAMIN D) 1000 units tablet Take 3,000-4,000 Units by mouth daily as needed (when vitamin D level is deficient).   ? glipiZIDE (GLUCOTROL) 5 MG tablet Take 5 mg by mouth 2 (two) times daily  before a meal.  ? HYDROcodone-acetaminophen (NORCO) 5-325 MG tablet Take 1 tablet by mouth every 4 (four) hours as needed for moderate pain.  ? losartan-hydrochlorothiazide (HYZAAR) 50-12.5 MG tablet Take 1 tablet by mouth daily.  ? metFORMIN (GLUCOPHAGE) 500 MG tablet Take 250 mg by mouth 2 (two) times daily with a meal.  ? MOUNJARO 2.5 MG/0.5ML Pen SMARTSIG:2.5 Milligram(s) SUB-Q Once a Week  ? pravastatin (PRAVACHOL) 40 MG tablet Take 20 mg by mouth daily.   ? aspirin 81 MG chewable tablet Chew 81 mg by mouth daily. (Patient not taking:  Reported on 04/27/2021)  ? methocarbamol (ROBAXIN) 500 MG tablet Take 2 tablets by mouth in the morning, at noon, in the evening, and at bedtime. (Patient not taking: Reported on 04/27/2021)  ? Vibegron (GEMTESA) 75 MG TABS Take 1 tablet by mouth daily at 12 noon. (Patient not taking: Reported on 04/27/2021)  ? ?No facility-administered encounter medications on file as of 04/27/2021.  ? ? ?REVIEW OF SYSTEMS:  ?Gen: Denies fever, sweats or chills. No weight loss.  ?CV: Denies chest pain, palpitations or edema. ?Resp: Denies cough, shortness of breath of hemoptysis.  ?GI: See HPI. ?GU : Denies urinary burning, blood in urine, increased urinary frequency or incontinence. ?MS: Denies joint pain, muscles aches or weakness. ?Derm: Denies rash, itchiness, skin lesions or unhealing ulcers. ?Psych: Denies depression and anxiety. + Mild memory issues.  ?Heme: Denies bruising, easy bleeding. ?Neuro:  Denies headaches, dizziness or paresthesias. ?Endo: + DM. ? ?PHYSICAL EXAM: ?BP 128/66   Pulse 70   Ht '5\' 7"'$  (1.702 m)   Wt 138 lb (62.6 kg)   SpO2 98%   BMI 21.61 kg/m?  ?General: 71 year old male in NAD.  ?Head: Normocephalic and atraumatic. ?Eyes:  Sclerae non-icteric, conjunctive pink. ?Ears: Normal auditory acuity. ?Mouth: Dentition intact. No ulcers or lesions.  ?Neck: Supple, no lymphadenopathy or thyromegaly.  ?Lungs: Clear bilaterally to auscultation without wheezes, crackles or rhonchi. ?Heart: Regular rate and rhythm. No murmur, rub or gallop appreciated.  ?Abdomen: Soft, nontender, distended. RLQ, central and LLQ tenderness without rebound or guarding. No masses. No hepatosplenomegaly. Normoactive bowel sounds x 4 quadrants. Lower midline abdominal scar intact.  ?Rectal: Deferred.  ?Musculoskeletal: Symmetrical with no gross deformities. ?Skin: Warm and dry. No rash or lesions on visible extremities. ?Extremities: No edema. ?Neurological: Alert oriented x 4, no focal deficits.  ?Psychological:  Alert and cooperative.  Normal mood and affect. ? ?ASSESSMENT AND PLAN: ? ?54) 71 year old male with lower abdominal pain x 2 to 4 months. Diverticulosis to the descending colon per CTAP 11/2020. Moderate lower abdominal tenderness on exam.  ?-CBC, CMP and CRP ?-CTAP with oral and IV contrast, Bun/Cr level to be reviewed prior to the patient receiving IV contrast ?-Patient instructed to go to the ED if he develops severe abdominal pain  ? ?2) Constipation  ?-Miralax Q HS as needed  ? ?3) Reported history of colon polyps. Last colonoscopy was approximately 10 years ago and possible identified one colon polyp. Father with history of colon cancer.  ?-Request copy of last colonoscopy procedure and biopsy report from [facility] ?-Schedule a colonoscopy after CTAP results reviewed  ? ?4) History of GERD, no active reflux symptoms at this time  ? ?5) Hepatic steatosis ?-CMP as ordered above  ? ?6) History of prostate cancer s/p prostatectomy in 2006 ? ?7) DM II ? ? ? ? ?CC:  Velna Hatchet, MD ? ? ? ?

## 2021-04-27 ENCOUNTER — Ambulatory Visit: Payer: Medicare Other | Admitting: Nurse Practitioner

## 2021-04-27 ENCOUNTER — Other Ambulatory Visit (INDEPENDENT_AMBULATORY_CARE_PROVIDER_SITE_OTHER): Payer: Medicare Other

## 2021-04-27 ENCOUNTER — Encounter: Payer: Self-pay | Admitting: Nurse Practitioner

## 2021-04-27 VITALS — BP 128/66 | HR 70 | Ht 67.0 in | Wt 138.0 lb

## 2021-04-27 DIAGNOSIS — R1032 Left lower quadrant pain: Secondary | ICD-10-CM

## 2021-04-27 DIAGNOSIS — R1031 Right lower quadrant pain: Secondary | ICD-10-CM | POA: Diagnosis not present

## 2021-04-27 DIAGNOSIS — Z8546 Personal history of malignant neoplasm of prostate: Secondary | ICD-10-CM

## 2021-04-27 LAB — CBC WITH DIFFERENTIAL/PLATELET
Basophils Absolute: 0.1 10*3/uL (ref 0.0–0.1)
Basophils Relative: 0.9 % (ref 0.0–3.0)
Eosinophils Absolute: 0 10*3/uL (ref 0.0–0.7)
Eosinophils Relative: 0.6 % (ref 0.0–5.0)
HCT: 44.4 % (ref 39.0–52.0)
Hemoglobin: 14.9 g/dL (ref 13.0–17.0)
Lymphocytes Relative: 30.2 % (ref 12.0–46.0)
Lymphs Abs: 1.7 10*3/uL (ref 0.7–4.0)
MCHC: 33.6 g/dL (ref 30.0–36.0)
MCV: 92.4 fl (ref 78.0–100.0)
Monocytes Absolute: 0.5 10*3/uL (ref 0.1–1.0)
Monocytes Relative: 8.4 % (ref 3.0–12.0)
Neutro Abs: 3.4 10*3/uL (ref 1.4–7.7)
Neutrophils Relative %: 59.9 % (ref 43.0–77.0)
Platelets: 241 10*3/uL (ref 150.0–400.0)
RBC: 4.81 Mil/uL (ref 4.22–5.81)
RDW: 14.5 % (ref 11.5–15.5)
WBC: 5.7 10*3/uL (ref 4.0–10.5)

## 2021-04-27 LAB — COMPREHENSIVE METABOLIC PANEL
ALT: 38 U/L (ref 0–53)
AST: 28 U/L (ref 0–37)
Albumin: 4.4 g/dL (ref 3.5–5.2)
Alkaline Phosphatase: 103 U/L (ref 39–117)
BUN: 9 mg/dL (ref 6–23)
CO2: 28 mEq/L (ref 19–32)
Calcium: 9.4 mg/dL (ref 8.4–10.5)
Chloride: 101 mEq/L (ref 96–112)
Creatinine, Ser: 1.05 mg/dL (ref 0.40–1.50)
GFR: 70.56 mL/min (ref >60.00)
Glucose, Bld: 127 mg/dL — ABNORMAL HIGH (ref 70–99)
Potassium: 4 mEq/L (ref 3.5–5.1)
Sodium: 136 mEq/L (ref 135–145)
Total Bilirubin: 0.7 mg/dL (ref 0.2–1.2)
Total Protein: 7.5 g/dL (ref 6.0–8.3)

## 2021-04-27 LAB — C-REACTIVE PROTEIN: CRP: <1 mg/dL (ref 0.5–20.0)

## 2021-04-27 NOTE — Patient Instructions (Signed)
You will be contacted by [facility] (Your caller ID will indicate phone # [phone]) in the next 7 days to schedule your CT Scan. If you have not heard from them within 7 business days, please call [facility] at [phone] to follow up on the status of your appointment.   ? ?Please proceed to the basement level for lab work before leaving today. Press "B" on the elevator. The lab is located at the first door on the left as you exit the elevator. ? ?HEALTHCARE LAWS AND MY CHART RESULTS:  ? ?Due to recent changes in healthcare laws, you may see results of your imaging and/or laboratory studies on MyChart before I have had a chance to review them.  I understand that in some cases there may be results that are confusing or concerning to you. Please understand that not all results are received at the same time and often I may need to interpret multiple results in order to provide you with the best plan of care or course of treatment. Therefore, I ask that you please give me 48 hours to thoroughly review all your results before contacting my office for clarification.  ? ?RECOMMENDATIONS: ? ?Miralax- Dissolve one capful in 8 ounces of water and drink before bed as tolerated to increase stool output. ?Go to the Emergency room if you develop severe abdominal pain. ?We will request copies of your past colonoscopies. ? ?Thank you for trusting me with your gastrointestinal care!   ? ?Noralyn Pick, CRNP ? ? ? ?BMI: ? ?If you are age 73 or older, your body mass index should be between 23-30. Your Body mass index is 21.61 kg/m?[name] If this is out of the aforementioned range listed, please consider follow up with your Primary Care Provider. ? ?If you are age 74 or younger, your body mass index should be between 19-25. Your Body mass index is 21.61 kg/m?[name] If this is out of the aformentioned range listed, please consider follow up with your Primary Care Provider.  ? ?MY  CHART: ? ?The [facility] providers would like to encourage you to use [facility] to communicate with providers for non-urgent requests or questions.  Due to long hold times on the telephone, sending your provider a message by [facility] may be a faster and more efficient way to get a response.  Please allow 48 business hours for a response.  Please remember that this is for non-urgent requests.  ? ?

## 2021-04-30 NOTE — Progress Notes (Signed)
I agree with the assessment and plan as outlined by Ms. Kennedy-Smith. 

## 2021-05-08 ENCOUNTER — Ambulatory Visit (HOSPITAL_COMMUNITY)
Admission: RE | Admit: 2021-05-08 | Discharge: 2021-05-08 | Disposition: A | Discharge status: Home / Self Care | Payer: Medicare Other | Source: Ambulatory Visit | Attending: Nurse Practitioner | Admitting: Nurse Practitioner

## 2021-05-08 DIAGNOSIS — Z8546 Personal history of malignant neoplasm of prostate: Secondary | ICD-10-CM

## 2021-05-08 DIAGNOSIS — R1031 Right lower quadrant pain: Secondary | ICD-10-CM | POA: Insufficient documentation

## 2021-05-08 DIAGNOSIS — R1032 Left lower quadrant pain: Secondary | ICD-10-CM

## 2021-05-12 ENCOUNTER — Other Ambulatory Visit: Payer: Self-pay

## 2021-05-12 ENCOUNTER — Encounter: Payer: Self-pay | Admitting: Internal Medicine

## 2021-05-12 DIAGNOSIS — K5909 Other constipation: Secondary | ICD-10-CM

## 2021-05-12 DIAGNOSIS — R1031 Right lower quadrant pain: Secondary | ICD-10-CM

## 2021-05-12 DIAGNOSIS — K760 Fatty (change of) liver, not elsewhere classified: Secondary | ICD-10-CM

## 2021-05-22 ENCOUNTER — Encounter: Payer: Self-pay | Admitting: Internal Medicine

## 2021-05-22 ENCOUNTER — Ambulatory Visit (AMBULATORY_SURGERY_CENTER): Payer: Medicare Other | Admitting: Internal Medicine

## 2021-05-22 VITALS — BP 120/77 | HR 72 | Temp 98.6°F | Resp 10 | Ht 67.0 in | Wt 138.0 lb

## 2021-05-22 DIAGNOSIS — D122 Benign neoplasm of ascending colon: Secondary | ICD-10-CM

## 2021-05-22 DIAGNOSIS — D120 Benign neoplasm of cecum: Secondary | ICD-10-CM

## 2021-05-22 DIAGNOSIS — Z1211 Encounter for screening for malignant neoplasm of colon: Secondary | ICD-10-CM

## 2021-05-22 DIAGNOSIS — R1031 Right lower quadrant pain: Secondary | ICD-10-CM

## 2021-05-22 MED ORDER — SODIUM CHLORIDE 0.9 % IV SOLN: 500.0000 mL | Freq: Once | INTRAVENOUS | Status: DC

## 2021-05-22 NOTE — Op Note (Signed)
[facility] ?Patient Name: [name] ?Procedure Date: 05/22/2021 10:31 AM ?MRN: [id number] ?Endoscopist: Sonny Masters "Christia Reading ,  ?Age: 71 ?Referring MD:  ?Date of Birth: March 18, 1950 ?Gender: Male ?Account #: [id number] ?Procedure:                Colonoscopy ?Indications:              Screening for colorectal malignant neoplasm ?Medicines:                Monitored Anesthesia Care ?Procedure:                Pre-Anesthesia Assessment: ?                          - Prior to the procedure, a History and Physical  ?                          was performed, and patient medications and  ?                          allergies were reviewed. The patient's tolerance of  ?                          previous anesthesia was also reviewed. The risks  ?                          and benefits of the procedure and the sedation  ?                          options and risks were discussed with the patient.  ?                          All questions were answered, and informed consent  ?                          was obtained. Prior Anticoagulants: The patient has  ?                          taken no previous anticoagulant or antiplatelet  ?                          agents. ASA Grade Assessment: II - A patient with  ?                          mild systemic disease. After reviewing the risks  ?                          and benefits, the patient was deemed in  ?                          satisfactory condition to undergo the procedure. ?                          After obtaining informed consent, the colonoscope  ?  was passed under direct vision. Throughout the  ?                          procedure, the patient's blood pressure, pulse, and  ?                          oxygen saturations were monitored continuously. The  ?                          Olympus CF-HQ190L Colonscope was introduced through  ?                          the anus and advanced to the the terminal ileum.  ?                          The  colonoscopy was performed without difficulty.  ?                          The patient tolerated the procedure well. The  ?                          quality of the bowel preparation was good. The  ?                          terminal ileum, ileocecal valve, appendiceal  ?                          orifice, and rectum were photographed. ?Scope In: 10:48:12 AM ?Scope Out: 11:07:38 AM ?Scope Withdrawal Time: 0 hours 16 minutes 22 seconds  ?Total Procedure Duration: 0 hours 19 minutes 26 seconds  ?Findings:                 The terminal ileum appeared normal. ?                          Multiple small and large-mouthed diverticula were  ?                          found in the entire colon. ?                          Two sessile polyps were found in the ascending  ?                          colon and cecum. The polyps were 3 to 5 mm in size.  ?                          These polyps were removed with a cold snare.  ?                          Resection and retrieval were complete. ?                          Two sessile polyps were found in the ascending  ?  colon. The polyps were 1 to 2 mm in size. These  ?                          polyps were removed with a cold biopsy forceps.  ?                          Resection and retrieval were complete. ?                          Non-bleeding internal hemorrhoids were found during  ?                          retroflexion. ?Complications:            No immediate complications. ?Estimated Blood Loss:     Estimated blood loss was minimal. ?Impression:               - The examined portion of the ileum was normal. ?                          - Diverticulosis in the entire examined colon. ?                          - Two 3 to 5 mm polyps in the ascending colon and  ?                          in the cecum, removed with a cold snare. Resected  ?                          and retrieved. ?                          - Two 1 to 2 mm polyps in the ascending colon,  ?                           removed with a cold biopsy forceps. Resected and  ?                          retrieved. ?                          - Non-bleeding internal hemorrhoids. ?Recommendation:           - Discharge patient to home (with escort). ?                          - Await pathology results. ?                          - The findings and recommendations were discussed  ?                          with the patient. ?Georgian Co,  ?05/22/2021 11:11:50 AM ?

## 2021-05-22 NOTE — Progress Notes (Signed)
Called to room to assist during endoscopic procedure.  Patient ID and intended procedure confirmed with present staff. Received instructions for my participation in the procedure from the performing physician.  

## 2021-05-22 NOTE — Patient Instructions (Addendum)
Handouts given for polyps and diverticulosis.  YOU HAD AN ENDOSCOPIC PROCEDURE TODAY AT THE [facility]:   Refer to the procedure report that was given to you for any specific questions about what was found during the examination.  If the procedure report does not answer your questions, please call your gastroenterologist to clarify.  If you requested that your care partner not be given the details of your procedure findings, then the procedure report has been included in a sealed envelope for you to review at your convenience later.  YOU SHOULD EXPECT: Some feelings of bloating in the abdomen. Passage of more gas than usual.  Walking can help get rid of the air that was put into your GI tract during the procedure and reduce the bloating. If you had a lower endoscopy (such as a colonoscopy or flexible sigmoidoscopy) you may notice spotting of blood in your stool or on the toilet paper. If you underwent a bowel prep for your procedure, you may not have a normal bowel movement for a few days.  Please Note:  You might notice some irritation and congestion in your nose or some drainage.  This is from the oxygen used during your procedure.  There is no need for concern and it should clear up in a day or so.  SYMPTOMS TO REPORT IMMEDIATELY:  Following lower endoscopy (colonoscopy or flexible sigmoidoscopy):  Excessive amounts of blood in the stool  Significant tenderness or worsening of abdominal pains  Swelling of the abdomen that is new, acute  Fever of 100F or higher  For urgent or emergent issues, a gastroenterologist can be reached at any hour by calling [phone]. Do not use MyChart messaging for urgent concerns.    DIET:  We do recommend a small meal at first, but then you may proceed to your regular diet.  Drink plenty of fluids but you should avoid alcoholic beverages for 24 hours.  ACTIVITY:  You should plan to take it easy for the rest of today and you should NOT DRIVE  or use heavy machinery until tomorrow (because of the sedation medicines used during the test).    FOLLOW UP: Our staff will call the number listed on your records 48-72 hours following your procedure to check on you and address any questions or concerns that you may have regarding the information given to you following your procedure. If we do not reach you, we will leave a message.  We will attempt to reach you two times.  During this call, we will ask if you have developed any symptoms of COVID 19. If you develop any symptoms (ie: fever, flu-like symptoms, shortness of breath, cough etc.) before then, please call [phone].  If you test positive for Covid 19 in the 2 weeks post procedure, please call and report this information to us.    If any biopsies were taken you will be contacted by phone or by letter within the next 1-3 weeks.  Please call us at [phone] if you have not heard about the biopsies in 3 weeks.    SIGNATURES/CONFIDENTIALITY: You and/or your care partner have signed paperwork which will be entered into your electronic medical record.  These signatures attest to the fact that that the information above on your After Visit Summary has been reviewed and is understood.  Full responsibility of the confidentiality of this discharge information lies with you and/or your care-partner.  

## 2021-05-22 NOTE — Progress Notes (Signed)
Pt's states no medical or surgical changes since previsit or office visit. VS assessed by D.T 

## 2021-05-22 NOTE — Progress Notes (Signed)
Report to pacu rn; vss. ?

## 2021-05-22 NOTE — Progress Notes (Signed)
? ?GASTROENTEROLOGY PROCEDURE H&P NOTE  ? ?Primary Care Physician: ?Cipriano Mile, NP ? ? ? ?Reason for Procedure:  Colon cancer screening ? ?Plan:    Colonoscopy ? ?Patient is appropriate for endoscopic procedure(s) in the ambulatory ([facility]) setting. ? ?The nature of the procedure, as well as the risks, benefits, and alternatives were carefully and thoroughly reviewed with the patient. Ample time for discussion and questions allowed. The patient understood, was satisfied, and agreed to proceed.  ? ? ? ?HPI: ?[name] is a 71 y.o. male who presents for colonoscopy for evaluation of colon cancer screening .  Patient was most recently seen in the Gastroenterology Clinic on 04/27/21.  No interval change in medical history since that appointment. Please refer to that note for full details regarding GI history and clinical presentation.  ? ?Past Medical History:  ?Diagnosis Date  ? Depression   ? Diabetes mellitus type 2, noninsulin dependent ([place]) 01/13/2011  ? Diverticulosis   ? Esophageal spasm   ? Essential hypertension   ? Fatty liver   ? GERD (gastroesophageal reflux disease) 01/13/2011  ? History of cardiac cath   ? a. 2005: normal cors, normal EF. b. Nuc 12/2010: normal, EF 61%. 2D Echo 12/2010: EF normal (% not given), no RWMA, trivial AI, aortic root trivially dilated, mild MR.  ? Hyperlipidemia   ? Prostate cancer [facility]) 2006  ? Gleason 7 treated with radical prostatectomy  ? Seasonal allergies   ? Wears glasses   ? ? ?Past Surgical History:  ?Procedure Laterality Date  ? CARDIAC CATHETERIZATION  2005  ? No CAD  ? CIRCUMCISION N/A 07/23/2018  ? Procedure: CIRCUMCISION ADULT;  Surgeon: Lucas Mallow, MD;  Location: [facility];  Service: Urology;  Laterality: N/A;  ? COLONOSCOPY    ? PROSTATECTOMY    ? ? ?Prior to Admission medications   ?Medication Sig Start Date End Date Taking? Authorizing Provider  ?amLODipine (NORVASC) 5 MG tablet amlodipine 5 mg tablet ? TAKE 1 TABLET BY MOUTH EVERY  DAY FOR BLOOD PRESSURE   Yes [provider]  ?cholecalciferol (VITAMIN D) 1000 units tablet Take 3,000-4,000 Units by mouth daily as needed (when vitamin D level is deficient).    Yes [provider]  ?glipiZIDE (GLUCOTROL) 5 MG tablet Take 5 mg by mouth 2 (two) times daily before a meal.   Yes [provider]  ?losartan-hydrochlorothiazide (HYZAAR) 50-12.5 MG tablet Take 1 tablet by mouth daily.   Yes [provider]  ?metFORMIN (GLUCOPHAGE) 500 MG tablet Take 250 mg by mouth 2 (two) times daily with a meal.   Yes [provider]  ?MOUNJARO 2.5 MG/0.5ML Pen SMARTSIG:2.5 Milligram(s) SUB-Q Once a Week 04/13/21  Yes [provider]  ?pravastatin (PRAVACHOL) 40 MG tablet Take 20 mg by mouth daily.  12/28/13  Yes [provider]  ?acetaminophen (TYLENOL) 325 MG tablet Take 325-650 mg by mouth every 6 (six) hours as needed for mild pain.  ?Patient not taking: Reported on 05/22/2021    [provider]  ?aspirin 81 MG chewable tablet Chew 81 mg by mouth daily. ?Patient not taking: Reported on 04/27/2021    [provider]  ?HYDROcodone-acetaminophen (NORCO) 5-325 MG tablet Take 1 tablet by mouth every 4 (four) hours as needed for moderate pain. ?Patient not taking: Reported on 05/22/2021 07/23/18   Lucas Mallow, MD  ?methocarbamol (ROBAXIN) 500 MG tablet Take 2 tablets by mouth in the morning, at noon, in the evening, and  at bedtime. ?Patient not taking: Reported on 04/27/2021    [provider]  ?Vibegron (GEMTESA) 75 MG TABS Take 1 tablet by mouth daily at 12 noon. ?Patient not taking: Reported on 05/22/2021    [provider]  ? ? ?Current Outpatient Medications  ?Medication Sig Dispense Refill  ? amLODipine (NORVASC) 5 MG tablet amlodipine 5 mg tablet ? TAKE 1 TABLET BY MOUTH EVERY DAY FOR BLOOD PRESSURE    ? cholecalciferol (VITAMIN D) 1000 units tablet Take 3,000-4,000 Units by mouth daily as needed (when vitamin D level is  deficient).     ? glipiZIDE (GLUCOTROL) 5 MG tablet Take 5 mg by mouth 2 (two) times daily before a meal.    ? losartan-hydrochlorothiazide (HYZAAR) 50-12.5 MG tablet Take 1 tablet by mouth daily.    ? metFORMIN (GLUCOPHAGE) 500 MG tablet Take 250 mg by mouth 2 (two) times daily with a meal.    ? MOUNJARO 2.5 MG/0.5ML Pen SMARTSIG:2.5 Milligram(s) SUB-Q Once a Week    ? pravastatin (PRAVACHOL) 40 MG tablet Take 20 mg by mouth daily.   0  ? acetaminophen (TYLENOL) 325 MG tablet Take 325-650 mg by mouth every 6 (six) hours as needed for mild pain.  (Patient not taking: Reported on 05/22/2021)    ? aspirin 81 MG chewable tablet Chew 81 mg by mouth daily. (Patient not taking: Reported on 04/27/2021)    ? HYDROcodone-acetaminophen (NORCO) 5-325 MG tablet Take 1 tablet by mouth every 4 (four) hours as needed for moderate pain. (Patient not taking: Reported on 05/22/2021) 10 tablet 0  ? methocarbamol (ROBAXIN) 500 MG tablet Take 2 tablets by mouth in the morning, at noon, in the evening, and at bedtime. (Patient not taking: Reported on 04/27/2021)    ? Vibegron (GEMTESA) 75 MG TABS Take 1 tablet by mouth daily at 12 noon. (Patient not taking: Reported on 05/22/2021)    ? ?Current Facility-Administered Medications  ?Medication Dose Route Frequency Provider Last Rate Last Admin  ? 0.9 %  sodium chloride infusion  500 mL Intravenous Once Sharyn Creamer, MD      ? ? ?Allergies as of 05/22/2021 - Review Complete 05/22/2021  ?Allergen Reaction Noted  ? Iohexol Nausea And Vomiting 04/27/2013  ? Jardiance [empagliflozin]  02/28/2018  ? ? ?Family History  ?Problem Relation Age of Onset  ? Cancer Mother   ? Colon cancer Father   ? COPD Sister   ? Hypertension Sister   ? Stroke Brother   ?     Fell and hit head, ? stroke  ? CAD Brother   ?     Bypass in his 54's.  ? Stomach cancer Neg Hx   ? Esophageal cancer Neg Hx   ? Colon polyps Neg Hx   ? ? ?Social History  ? ?Socioeconomic History  ? Marital status: Divorced  ?  Spouse name: Not on file   ? Number of children: 5  ? Years of education: 60  ? Highest education level: Not on file  ?Occupational History  ? Occupation: BUS DRIVER  ?  Employer: [employer]  ?Tobacco Use  ? Smoking status: Never  ? Smokeless tobacco: Never  ?Vaping Use  ? Vaping Use: Never used  ?Substance and Sexual Activity  ? Alcohol use: No  ? Drug use: No  ? Sexual activity: Not on file  ?Other Topics Concern  ? Not on file  ?Social History Narrative  ? Patient is divorced and lives alone.  ? Patient has  five children.  ? 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    ? Patient has a high school education.  ? Patient is right-handed.  ? Patient drinks two cups of tea daily.  ? ?Social Determinants of Health  ? ?Financial Resource Strain: Not on file  ?Food Insecurity: Not on file  ?Transportation Needs: Not on file  ?Physical Activity: Not on file  ?Stress: Not on file  ?Social Connections: Not on file  ?Intimate Partner Violence: Not on file  ? ? ?Physical Exam: ?Vital signs in last 24 hours: ?BP 140/84   Pulse 74   Temp 98.6 ?F (37 ?C) (Skin)   Resp (!) 0   Ht '5\' 7"'$  (1.702 m)   Wt 138 lb (62.6 kg)   SpO2 99%   BMI 21.61 kg/m?  ?GEN: NAD ?EYE: Sclerae anicteric ?ENT: MMM ?CV: Non-tachycardic ?Pulm: No increased WOB ?GI: Soft ?NEURO:  Alert & Oriented ? ? ?Christia Reading, MD ?[facility] ? ? ?05/22/2021 10:32 AM ? ?

## 2021-05-24 ENCOUNTER — Telehealth: Payer: Self-pay | Admitting: *Deleted

## 2021-05-24 ENCOUNTER — Encounter: Payer: Self-pay | Admitting: Internal Medicine

## 2021-05-24 NOTE — Telephone Encounter (Signed)
?  Follow up Call- ? ? ?  05/22/2021  ?  9:18 AM  ?Call back number  ?Post procedure Call Back phone  # [phone]  ?Permission to leave phone message Yes  ?  ? ?Patient questions: ? ?Do you have a fever, pain , or abdominal swelling? No. ?Pain Score  0 * ? ?Have you tolerated food without any problems? Yes.   ? ?Have you been able to return to your normal activities? Yes.   ? ?Do you have any questions about your discharge instructions: ?Diet   No. ?Medications  No. ?Follow up visit  No. ? ?Do you have questions or concerns about your Care? Yes.   ? ?Actions: ?* If pain score is 4 or above: ?No action needed, pain <4.pt not having problems from recent colonoscopy but pt says he is still having abdominal pain which is why he came to Dr Lorenso Courier originally and got scheduled for the colonoscopy. Pt says he still does not know anything about why he is having stomach pain.  ? ? ?

## 2021-05-24 NOTE — Telephone Encounter (Signed)
Per Dr. Libby Maw request, pt is scheduled for 1 mo f/u 06/27/21. Refer to appts. Appt reminder mailed. ?

## 2021-06-27 ENCOUNTER — Ambulatory Visit: Payer: Medicare Other | Admitting: Internal Medicine

## 2021-06-27 ENCOUNTER — Other Ambulatory Visit (INDEPENDENT_AMBULATORY_CARE_PROVIDER_SITE_OTHER): Payer: Medicare Other

## 2021-06-27 ENCOUNTER — Encounter: Payer: Self-pay | Admitting: Internal Medicine

## 2021-06-27 VITALS — BP 130/78 | HR 67 | Ht 67.0 in | Wt 137.8 lb

## 2021-06-27 DIAGNOSIS — K219 Gastro-esophageal reflux disease without esophagitis: Secondary | ICD-10-CM

## 2021-06-27 DIAGNOSIS — K5730 Diverticulosis of large intestine without perforation or abscess without bleeding: Secondary | ICD-10-CM

## 2021-06-27 DIAGNOSIS — K5900 Constipation, unspecified: Secondary | ICD-10-CM

## 2021-06-27 DIAGNOSIS — K760 Fatty (change of) liver, not elsewhere classified: Secondary | ICD-10-CM

## 2021-06-27 DIAGNOSIS — R1032 Left lower quadrant pain: Secondary | ICD-10-CM

## 2021-06-27 DIAGNOSIS — Z86010 Personal history of colonic polyps: Secondary | ICD-10-CM

## 2021-06-27 LAB — URINALYSIS WITH CULTURE, IF INDICATED
Bilirubin Urine: NEGATIVE
Hgb urine dipstick: NEGATIVE
Ketones, ur: NEGATIVE
Leukocytes,Ua: NEGATIVE
Nitrite: NEGATIVE
RBC / HPF: NONE SEEN (ref 0–?)
Specific Gravity, Urine: 1.025 (ref 1.000–1.030)
Total Protein, Urine: 30 — AB
Urine Glucose: >=1000 — AB
Urobilinogen, UA: 1 (ref 0.0–1.0)
pH: 6 (ref 5.0–8.0)

## 2021-06-27 MED ORDER — DICYCLOMINE HCL 10 MG PO CAPS: 10.0000 mg | ORAL_CAPSULE | Freq: Four times a day (QID) | ORAL | 3 refills | Status: DC | PRN

## 2021-06-27 MED ORDER — LINACLOTIDE 72 MCG PO CAPS: 72.0000 ug | ORAL_CAPSULE | Freq: Every day | ORAL | 3 refills | Status: DC

## 2021-06-27 NOTE — Patient Instructions (Addendum)
Your provider has requested that you go to the basement level for lab work before leaving today. Press "B" on the elevator. The lab is located at the first door on the left as you exit the elevator.  UA/Culture  [id number] diet handout given   We have sent the following medications to your pharmacy for you to pick up at your convenience:  Linzess 72 mcg  Bentyl 10 mg  Due to recent changes in healthcare laws, you may see the results of your imaging and laboratory studies on MyChart before your provider has had a chance to review them.  We understand that in some cases there may be results that are confusing or concerning to you. Not all laboratory results come back in the same time frame and the provider may be waiting for multiple results in order to interpret others.  Please give [place] 48 hours in order for your provider to thoroughly review all the results before contacting the office for clarification of your results.    If you are age 73 or older, your body mass index should be between 23-30. Your Body mass index is 21.58 kg/m. If this is out of the aforementioned range listed, please consider follow up with your Primary Care Provider.  If you are age 73 or younger, your body mass index should be between 19-25. Your Body mass index is 21.58 kg/m. If this is out of the aformentioned range listed, please consider follow up with your Primary Care Provider.   ________________________________________________________  The [facility] providers would like to encourage you to use [facility] to communicate with providers for non-urgent requests or questions.  Due to long hold times on the telephone, sending your provider a message by [facility] may be a faster and more efficient way to get a response.  Please allow 48 business hours for a response.  Please remember that this is for non-urgent requests.  _______________________________________________________   I appreciate the  opportunity to care for  you  Thank You   Sonny Masters Dorsey,MD

## 2021-06-27 NOTE — Progress Notes (Signed)
06/27/2021 DELMAS FAUCETT [id number] 1950-04-01   CHIEF COMPLAINT: Abdominal pain   HISTORY OF PRESENT ILLNESS: [name] is a 71 year old male with a past medical history of depression, hypertension, DM II, TIA, atypical chest pain with normal cardiac cath in 2005 and normal nuclear stress test 2012, 2016 and 02/2017, prostate cancer s/p prostatectomy 2006, fatty liver, GERD, diverticulosis and colon polyps presents for follow-up of abdominal pain  Interval History: Patient states that he has continued to have issues with lower abdominal pain, primarily in the left lower quadrant.  This pain is constant.  There is nothing really that makes the pain worse or better.  He cannot think of any particular inciting factor for the pain.  The pain has been present for the last several months.  He would rate the pain as a 2-3 out of 10 in terms of severity.  Patient states that his acid reflux symptoms are under good control.  Denies upper abdominal pain.  Denies nausea, vomiting, dysphagia.  Weight has been stable.  He does have some leakage of urine on occasion.  Denies dysuria or hematuria.  Patient does have a history of prostate cancer that was treated with radical prostatectomy many years ago.  Wt Readings from Last 3 Encounters:  06/27/21 137 lb 12.8 oz (62.5 kg)  05/22/21 138 lb (62.6 kg)  04/27/21 138 lb (62.6 kg)    Outpatient Encounter Medications as of 06/27/2021  Medication Sig   amLODipine (NORVASC) 5 MG tablet amlodipine 5 mg tablet  TAKE 1 TABLET BY MOUTH EVERY DAY FOR BLOOD PRESSURE   aspirin 81 MG chewable tablet Chew 81 mg by mouth daily.   cholecalciferol (VITAMIN D) 1000 units tablet Take 3,000-4,000 Units by mouth daily as needed (when vitamin D level is deficient).    glipiZIDE (GLUCOTROL) 5 MG tablet Take 5 mg by mouth 2 (two) times daily before a meal.   losartan-hydrochlorothiazide (HYZAAR) 50-12.5 MG tablet Take 1 tablet by mouth daily.   MOUNJARO 2.5 MG/0.5ML Pen  SMARTSIG:2.5 Milligram(s) SUB-Q Once a Week   pravastatin (PRAVACHOL) 40 MG tablet Take 20 mg by mouth daily.    metFORMIN (GLUCOPHAGE) 500 MG tablet Take 250 mg by mouth 2 (two) times daily with a meal. (Patient not taking: Reported on 06/27/2021)   Vibegron (GEMTESA) 75 MG TABS Take 1 tablet by mouth daily at 12 noon. (Patient not taking: Reported on 05/22/2021)   [DISCONTINUED] acetaminophen (TYLENOL) 325 MG tablet Take 325-650 mg by mouth every 6 (six) hours as needed for mild pain.  (Patient not taking: Reported on 05/22/2021)   [DISCONTINUED] HYDROcodone-acetaminophen (NORCO) 5-325 MG tablet Take 1 tablet by mouth every 4 (four) hours as needed for moderate pain. (Patient not taking: Reported on 05/22/2021)   [DISCONTINUED] methocarbamol (ROBAXIN) 500 MG tablet Take 2 tablets by mouth in the morning, at noon, in the evening, and at bedtime. (Patient not taking: Reported on 04/27/2021)   No facility-administered encounter medications on file as of 06/27/2021.    PHYSICAL EXAM: BP 130/78   Pulse 67   Ht '5\' 7"'$  (1.702 m)   Wt 137 lb 12.8 oz (62.5 kg)   SpO2 98%   BMI 21.58 kg/m  General: 71 year old male in NAD.  Head: Normocephalic and atraumatic. Eyes:  Sclerae non-icteric, conjunctive pink. Ears: Normal auditory acuity. Mouth: Dentition intact. No ulcers or lesions.  Neck: Supple, no lymphadenopathy or thyromegaly.  Lungs: Clear bilaterally to auscultation without wheezes, crackles or rhonchi.  Heart: Regular rate and rhythm. No murmur, rub or gallop appreciated.  Abdomen: Soft, nontender, distended. RLQ, LLQ tenderness without rebound or guarding. No masses. No hepatosplenomegaly. Normoactive bowel sounds x 4 quadrants. Lower midline abdominal scar intact.  Rectal: Deferred.  Musculoskeletal: Symmetrical with no gross deformities. Skin: Warm and dry. No rash or lesions on visible extremities. Extremities: No edema. Neurological: Alert oriented x 4, no focal deficits.  Psychological:  Alert  and cooperative. Normal mood and affect.  Labs 04/2021: CBC and CMP unremarkable. CRP nml.   CT A/P w/o contrast 05/08/21: IMPRESSION: 1. No acute abnormality of the abdomen or pelvis. 2. Left-sided colonic diverticulosis without findings of acute diverticulitis. 3. Hepatic steatosis. 4. Aortic Atherosclerosis (ICD10-I70.0).  Colonoscopy 05/22/21: - The examined portion of the ileum was normal. - Diverticulosis in the entire examined colon. - Two 3 to 5 mm polyps in the ascending colon and in the cecum, removed with a cold snare. Resected and retrieved - Two 1 to 2 mm polyps in the ascending colon, removed with a cold biopsy forceps. Resected and retrieved. - Non-bleeding internal hemorrhoids. Path: Surgical [P], colon, ascending and cecum, polyp (4) TUBULAR ADENOMAS NEGATIVE FOR HIGH-GRADE DYSPLASIA AND CARCINOMA  ASSESSMENT AND PLAN: LLQ ab pain Diverticulosis Constipation History of colon polyps GERD Hepatic steatosis Patient presents with left lower quadrant abdominal pain that has been present for several months.  Pain has been stable in severity.  The pain does not seem to be associated with his bowel habits.  At this time I am not convinced that the pain he is experiencing is due to a GI issue.  However he has had issues with constipation in the past and was noted to have significant stool burden on his most recent CT scan per my review.  Thus will start the patient on Linzess to see if this will help with his symptoms.  Will also offer him an antispasmodic as a trial.  I will also go ahead and check his urine today to see if there are any signs of UTI.  Patient could also be having MSK related pain.  He will plan to touch base with his urologist regarding some issues with urine leakage and to discuss his left lower quadrant abdominal pain.  - Check U/A and urine culture - Low FODMAP diet - Bentyl PRN - Start Linzess 72 mcg QD - Repeat colonoscopy in 05/2024 - Patient will touch  base with urologist about urine leakage and LLQ ab pain - RTC 2 months  Christia Reading, MD  I spent 41 minutes of time, including in depth chart review, independent review of results as outlined above, communicating results with the patient directly, face-to-face time with the patient, coordinating care, ordering studies and medications as appropriate, and documentation.

## 2021-08-03 NOTE — Progress Notes (Signed)
Cardiology Office Note:    Date:  08/04/2021   ID:  [name], DOB 29-Apr-1950, MRN [id number]  PCP:  Justin Mile, NP  Cardiologist:  Justin Moores, MD   Referring MD: Justin Mile, NP   Problem list 1.  Chest pain 2.  Hypertension 3.  Diabetes mellitus 4.  Hyperlipidemia  Chief Complaint  Patient presents with   Hypertension    History of Present Illness:    [name] is a 71 y.o. male with a hx of hypertension, diabetes mellitus, hyperlipidemia who is referred from the emergency room for further evaluation of some episodes of chest discomfort.  [name] is a 71 yo who has had chest pain for years. Feels like a pressure like sensation  Seems to be left sided,  Radiates to his back  Also has some left arm numbness - but this is not related to the CP  Does not get any regular exercise  Not related to walking up stairs or bringing in the groceries Pains might last as long as a day or so  Not associated with dyspnea or diaphoresis  Past stress myoivew in 2008 with [name] was normal  Over the past week or so, he has been eating lots of [place] fish  ( [employer])  [name], Does not like Salmon  Works in Maintenance - typically at [employer]    August 04, 2021 [name] is seen for follow up of his CP.  I last saw him 4 years ago. We performed a myoview which was low risk.   He was to come back as needed.  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 No CP , no dyspnea  [place] , [place] , had a great visit     Past Medical History:  Diagnosis Date   Depression    Diabetes mellitus type 2, noninsulin dependent ([place]) 01/13/2011   Diverticulosis    Esophageal spasm    Essential hypertension    Fatty liver    GERD (gastroesophageal reflux disease) 01/13/2011   History of cardiac cath    a. 2005: normal cors, normal EF. b. Nuc  12/2010: normal, EF 61%. 2D Echo 12/2010: EF normal (% not given), no RWMA, trivial AI, aortic root trivially dilated, mild MR.   Hyperlipidemia    Prostate cancer ([place]) 2006   Gleason 7 treated with radical prostatectomy   Seasonal allergies    Wears glasses     Past Surgical History:  Procedure Laterality Date   CARDIAC CATHETERIZATION  2005   No CAD   CIRCUMCISION N/A 07/23/2018   Procedure: CIRCUMCISION ADULT;  Surgeon: Justin Mallow, MD;  Location: [facility];  Service: Urology;  Laterality: N/A;   COLONOSCOPY     PROSTATECTOMY      Current Medications: Current Meds  Medication Sig   amLODipine (NORVASC) 5 MG tablet amlodipine 5 mg tablet  TAKE 1 TABLET BY MOUTH EVERY DAY FOR BLOOD PRESSURE   amLODipine (NORVASC) 5 MG tablet Take 1 tablet (5 mg total) by mouth daily.   aspirin 81 MG chewable tablet Chew 81 mg by mouth daily.   cholecalciferol (VITAMIN D) 1000 units tablet Take 3,000-4,000 Units by mouth daily as needed (when vitamin D level is deficient).    dicyclomine (BENTYL) 10 MG capsule Take 1 capsule (10 mg total) by  mouth every 6 (six) hours as needed for spasms.   glipiZIDE (GLUCOTROL) 5 MG tablet Take 5 mg by mouth 2 (two) times daily before a meal.   linaclotide (LINZESS) 72 MCG capsule Take 1 capsule (72 mcg total) by mouth daily before breakfast.   MOUNJARO 2.5 MG/0.5ML Pen SMARTSIG:2.5 Milligram(s) SUB-Q Once a Week   pravastatin (PRAVACHOL) 20 MG tablet Take 1 tablet (20 mg total) by mouth every evening.   pravastatin (PRAVACHOL) 40 MG tablet Take 20 mg by mouth daily.    [DISCONTINUED] losartan-hydrochlorothiazide (HYZAAR) 50-12.5 MG tablet Take 1 tablet by mouth daily.     Allergies:   Iohexol and Jardiance [empagliflozin]   Social History   Socioeconomic History   Marital status: Divorced    Spouse name: Not on file   Number of children: 5   Years of education: 12   Highest education level: Not on file  Occupational History    Occupation: BUS DRIVER    Employer: [employer]  Tobacco Use   Smoking status: Never   Smokeless tobacco: Never  Vaping Use   Vaping Use: Never used  Substance and Sexual Activity   Alcohol use: No   Drug use: No   Sexual activity: Not on file  Other Topics Concern   Not on file  Social History Narrative   Patient is divorced and lives alone.   Patient has five children.   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     Patient has a high school education.   Patient is right-handed.   Patient drinks two cups of tea daily.   Social Determinants of Health   Financial Resource Strain: Not on file  Food Insecurity: Not on file  Transportation Needs: Not on file  Physical Activity: Not on file  Stress: Not on file  Social Connections: Not on file     Family History: The patient's family history includes CAD in his brother; COPD in his sister; Cancer in his mother; Colon cancer in his father; Hypertension in his sister; Stroke in his brother. There is no history of Stomach cancer, Esophageal cancer, or Colon polyps.  ROS:   Please see the history of present illness.     All other systems reviewed and are negative.  EKGs/Labs/Other Studies Reviewed:    The following studies were reviewed today:     Recent Labs: 04/27/2021: ALT 38; BUN 9; Creatinine, Ser 1.05; Hemoglobin 14.9; Platelets 241.0; Potassium 4.0; Sodium 136  Recent Lipid Panel    Component Value Date/Time   CHOL 130 01/31/2014 0138   TRIG 84 01/31/2014 0138   HDL 28 (L) 01/31/2014 0138   CHOLHDL 4.6 01/31/2014 0138   VLDL 17 01/31/2014 0138   LDLCALC 85 01/31/2014 0138    Physical Exam:     Physical Exam: Blood pressure 140/76, pulse 98, height '5\' 8"'$  (1.727 m), weight 137 lb 9.6 oz (62.4 kg), SpO2 98 %.  GEN:  Well nourished, well developed in no acute distress HEENT: Normal NECK: No JVD; No carotid bruits LYMPHATICS: No lymphadenopathy CARDIAC: RRR , no murmurs,  rubs, gallops RESPIRATORY:  Clear to auscultation without rales, wheezing or rhonchi  ABDOMEN: Soft, non-tender, non-distended MUSCULOSKELETAL:  No edema; No deformity  SKIN: Warm and dry NEUROLOGIC:  Alert and oriented x 3  EKG:   August 04, 2021: Normal sinus rhythm at 72.  Voltage criteria for LVH-may be due to his thin frame and limited body fat.  ASSESSMENT:  1. Primary hypertension   2. Mixed hyperlipidemia   3. Type 2 diabetes mellitus with complication, without long-term current use of insulin (HCC)    PLAN:      Chest discomfort:  He denies having any episodes of chest discomfort.  He walks on a regular basis.  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  He denies any chest pain or shortness of breath.   Medication Adjustments/Labs and Tests Ordered: Current medicines are reviewed at length with the patient today.  Concerns regarding medicines are outlined above.  Orders Placed This Encounter  Procedures   ALT   Lipid panel   EKG 12-Lead   Meds ordered this encounter  Medications   pravastatin (PRAVACHOL) 20 MG tablet    Sig: Take 1 tablet (20 mg total) by mouth every evening.    Dispense:  90 tablet    Refill:  3   losartan-hydrochlorothiazide (HYZAAR) 50-12.5 MG tablet    Sig: Take 1 tablet by mouth daily.    Dispense:  90 tablet    Refill:  3   amLODipine (NORVASC) 5 MG tablet    Sig: Take 1 tablet (5 mg total) by mouth daily.    Dispense:  90 tablet    Refill:  3     Signed, Justin Moores, MD  08/04/2021 10:40 AM    [place]

## 2021-08-04 ENCOUNTER — Encounter: Payer: Self-pay | Admitting: Cardiovascular Disease

## 2021-08-04 ENCOUNTER — Ambulatory Visit: Payer: Medicare Other | Admitting: Cardiovascular Disease

## 2021-08-04 VITALS — BP 140/76 | HR 98 | Ht 68.0 in | Wt 137.6 lb

## 2021-08-04 DIAGNOSIS — E118 Type 2 diabetes mellitus with unspecified complications: Secondary | ICD-10-CM | POA: Diagnosis not present

## 2021-08-04 DIAGNOSIS — I10 Essential (primary) hypertension: Secondary | ICD-10-CM

## 2021-08-04 DIAGNOSIS — E782 Mixed hyperlipidemia: Secondary | ICD-10-CM

## 2021-08-04 LAB — LIPID PANEL
Chol/HDL Ratio: 4.6 ratio (ref 0.0–5.0)
Cholesterol, Total: 133 mg/dL (ref 100–199)
HDL: 29 mg/dL — ABNORMAL LOW (ref >39)
LDL Chol Calc (NIH): 78 mg/dL (ref 0–99)
Triglycerides: 150 mg/dL — ABNORMAL HIGH (ref 0–149)
VLDL Cholesterol Cal: 26 mg/dL (ref 5–40)

## 2021-08-04 LAB — ALT: ALT: 52 IU/L — ABNORMAL HIGH (ref 0–44)

## 2021-08-04 MED ORDER — PRAVASTATIN SODIUM 20 MG PO TABS: 20.0000 mg | ORAL_TABLET | Freq: Every evening | ORAL | 3 refills | Status: DC

## 2021-08-04 MED ORDER — AMLODIPINE BESYLATE 5 MG PO TABS: 5.0000 mg | ORAL_TABLET | Freq: Every day | ORAL | 3 refills | Status: DC

## 2021-08-04 MED ORDER — LOSARTAN POTASSIUM-HCTZ 50-12.5 MG PO TABS: 1.0000 | ORAL_TABLET | Freq: Every day | ORAL | 3 refills | Status: AC

## 2021-08-04 NOTE — Patient Instructions (Signed)
Medication Instructions:  REFILLED Pravastatin, Losartan, Amlodipine *If you need a refill on your cardiac medications before your next appointment, please call your pharmacy*   Lab Work: LIPIDS, ALT Today If you have labs (blood work) drawn today and your tests are completely normal, you will receive your results only by: [place] (if you have MyChart) OR A paper copy in the mail If you have any lab test that is abnormal or we need to change your treatment, we will call you to review the results.   Testing/Procedures: NONE   Follow-Up: At [facility], you and your health needs are our priority.  As part of our continuing mission to provide you with exceptional heart care, we have created designated Provider Care Teams.  These Care Teams include your primary Cardiologist (physician) and Advanced Practice Providers (APPs -  Physician Assistants and Nurse Practitioners) who all work together to provide you with the care you need, when you need it.  Your next appointment:   1 year(s)  The format for your next appointment:   In Person  Provider:   Ronn Melena, or Nahser {     Important Information About Sugar

## 2021-08-23 ENCOUNTER — Encounter: Payer: Self-pay | Admitting: Internal Medicine

## 2021-08-23 ENCOUNTER — Ambulatory Visit: Payer: Medicare Other | Admitting: Internal Medicine

## 2021-08-23 ENCOUNTER — Other Ambulatory Visit (INDEPENDENT_AMBULATORY_CARE_PROVIDER_SITE_OTHER): Payer: Medicare Other

## 2021-08-23 VITALS — BP 122/70 | HR 61 | Ht 67.0 in | Wt 137.0 lb

## 2021-08-23 DIAGNOSIS — R1030 Lower abdominal pain, unspecified: Secondary | ICD-10-CM

## 2021-08-23 DIAGNOSIS — K5900 Constipation, unspecified: Secondary | ICD-10-CM

## 2021-08-23 DIAGNOSIS — K760 Fatty (change of) liver, not elsewhere classified: Secondary | ICD-10-CM | POA: Diagnosis not present

## 2021-08-23 LAB — IBC + FERRITIN
Ferritin: 325.5 ng/mL — ABNORMAL HIGH (ref 22.0–322.0)
Iron: 130 ug/dL (ref 42–165)
Saturation Ratios: 39.7 % (ref 20.0–50.0)
TIBC: 327.6 ug/dL (ref 250.0–450.0)
Transferrin: 234 mg/dL (ref 212.0–360.0)

## 2021-08-23 MED ORDER — LINACLOTIDE 145 MCG PO CAPS: 145.0000 ug | ORAL_CAPSULE | Freq: Every day | ORAL | 5 refills | Status: DC

## 2021-08-23 NOTE — Progress Notes (Signed)
08/23/2021 [name] [id number] December 03, 1950   CHIEF COMPLAINT: Abdominal pain   HISTORY OF PRESENT ILLNESS: [name] is a 71 year old male with a past medical history of depression, hypertension, DM II, TIA, atypical chest pain with normal cardiac cath in 2005 and normal nuclear stress test 2012, 2016 and 02/2017, prostate cancer s/p prostatectomy 2006, fatty liver, GERD, diverticulosis and colon polyps presents for follow up of abdominal pain  Interval History: He is still having lower abdominal that is constant. This pain is not affected by eating or having a BM. He does note that his ab pain seemed to start at about the same time that he started taking Mounjaro. He is still having issues with constipation. He has one BM 3 times per week on average. He is taking Miralax PRN, which is helping to induce slightly more frequent BMs. Denies N&V, dysphagia, odynophagia, upper abdominal pain, chest burning, regurgitation.   Outpatient Encounter Medications as of 08/23/2021  Medication Sig   amLODipine (NORVASC) 5 MG tablet Take 1 tablet (5 mg total) by mouth daily.   glipiZIDE (GLUCOTROL) 5 MG tablet Take 5 mg by mouth 2 (two) times daily before a meal.   losartan-hydrochlorothiazide (HYZAAR) 50-12.5 MG tablet Take 1 tablet by mouth daily.   MOUNJARO 2.5 MG/0.5ML Pen SMARTSIG:2.5 Milligram(s) SUB-Q Once a Week   pravastatin (PRAVACHOL) 20 MG tablet Take 1 tablet (20 mg total) by mouth every evening.   [DISCONTINUED] amLODipine (NORVASC) 5 MG tablet amlodipine 5 mg tablet  TAKE 1 TABLET BY MOUTH EVERY DAY FOR BLOOD PRESSURE   [DISCONTINUED] aspirin 81 MG chewable tablet Chew 81 mg by mouth daily.   [DISCONTINUED] cholecalciferol (VITAMIN D) 1000 units tablet Take 3,000-4,000 Units by mouth daily as needed (when vitamin D level is deficient).    [DISCONTINUED] dicyclomine (BENTYL) 10 MG capsule Take 1 capsule (10 mg total) by mouth every 6 (six) hours as needed for spasms.    [DISCONTINUED] linaclotide (LINZESS) 72 MCG capsule Take 1 capsule (72 mcg total) by mouth daily before breakfast.   [DISCONTINUED] metFORMIN (GLUCOPHAGE) 500 MG tablet Take 250 mg by mouth 2 (two) times daily with a meal. (Patient not taking: Reported on 06/27/2021)   [DISCONTINUED] pravastatin (PRAVACHOL) 40 MG tablet Take 20 mg by mouth daily.    [DISCONTINUED] Vibegron (GEMTESA) 75 MG TABS Take 1 tablet by mouth daily at 12 noon. (Patient not taking: Reported on 05/22/2021)   No facility-administered encounter medications on file as of 08/23/2021.   PHYSICAL EXAM: BP 122/70   Pulse 61   Ht '5\' 7"'$  (1.702 m)   Wt 137 lb (62.1 kg)   BMI 21.46 kg/m  General: Alert. NAD Head: Normocephalic and atraumatic.  Lungs: Clear bilaterally to auscultation without wheezes, crackles or rhonchi. Heart: Regular rate and rhythm. No murmur, rub or gallop appreciated.  Abdomen: Soft, nontender, distended. Tender in the lower abdomen. No masses. No hepatosplenomegaly. Normoactive bowel sounds x 4 quadrants. Lower midline abdominal scar intact.  Skin: Warm and dry. No rash or lesions on visible extremities. Extremities: No edema. Neurological: Alert oriented x 4, no focal deficits.  Psychological:  Alert and cooperative. Normal mood and affect.  Labs 04/2021: CBC, CMP, and CRP unremarkable  Labs 07/2021: ALT is mildly elevated at 52.  CT A/P w/contrast 11/25/20: IMPRESSION: 1. No acute findings in the abdomen. 2. Diffuse hepatic steatosis. 3. Descending colonic diverticulosis without findings of acute diverticulitis. 4. Aortic Atherosclerosis (ICD10-I70.0).  CT A/P w/contrast 05/08/21: IMPRESSION:  1. No acute abnormality of the abdomen or pelvis. 2. Left-sided colonic diverticulosis without findings of acute diverticulitis. 3. Hepatic steatosis. 4. Aortic Atherosclerosis (ICD10-I70.0).  Colonoscopy 02/21/12: External and internal hemorrhoids. Diverticulosis in the sigmoid colon, distal descending colon,  and cecum. Path: Surgical [P], transverse colon polyp - NO COLONIC TISSUE IDENTIFIED, SEE COMMENT. Microscopic Comment There is a minute fragment of biopsy material present in the specimen container/formalin solution. Following routine specimen processing, there is no colonic tissue identified. (CR:kh 02-26-12)  Colonoscopy 05/22/21: - The examined portion of the ileum was normal. - Diverticulosis in the entire examined colon. - Two 3 to 5 mm polyps in the ascending colon and in the cecum, removed with a cold snare. Resected and retrieved. - Two 1 to 2 mm polyps in the ascending colon, removed with a cold biopsy forceps. Resected and retrieved. - Non-bleeding internal hemorrhoids. Path: Surgical [P], colon, ascending and cecum, polyp (4) TUBULAR ADENOMAS NEGATIVE FOR HIGH-GRADE DYSPLASIA AND CARCINOMA  ASSESSMENT AND PLAN: Lower abdominal pain Constipation Fatty liver History of colon polyps Patient presents with continued lower abdominal pain. Recent work up with labs, imaging, and colonoscopy were unrevealing for an obvious source of his ab pain. Patient does continue to have difficulties with constipation, which could be contributing to his abdominal pain. Will start him on Linzess to see this will help with his symptoms. The temporal relationship between the start of his abdominal pain and his first dose of Mounjaro could suggest that the [facility] leading to his ab pain as well. Since patient was found to have fatty liver on imaging and recently had a mildly elevated ALT, then will do a basic work up. - Aim to eat at least 25 grams of fiber per day - Start daily Linzess 145 mcg QD - Check Hep A antibody, Hep B surface antibody, Hep B surface antigen, ferritin/IBC, ANA, ASMA, IgG - Consider stopping Mounjaro in the future - Next colonoscopy due in 05/2024 - RTC 2 months  I spent 43 minutes of time, including in depth chart review, independent review of results as outlined above,  communicating results with the patient directly, face-to-face time with the patient, coordinating care, ordering studies and medications as appropriate, and documentation.

## 2021-08-23 NOTE — Patient Instructions (Signed)
Your provider has requested that you go to the basement level for lab work before leaving today. Press "B" on the elevator. The lab is located at the first door on the left as you exit the elevator.  We have sent the following medications to your pharmacy for you to pick up at your convenience: Linzess 145 mcg daily.   The [place] [facility] providers would like to encourage you to use [facility] to communicate with providers for non-urgent requests or questions.  Due to long hold times on the telephone, sending your provider a message by [facility] may be a faster and more efficient way to get a response.  Please allow 48 business hours for a response.  Please remember that this is for non-urgent requests.   Due to recent changes in healthcare laws, you may see the results of your imaging and laboratory studies on MyChart before your provider has had a chance to review them.  We understand that in some cases there may be results that are confusing or concerning to you. Not all laboratory results come back in the same time frame and the provider may be waiting for multiple results in order to interpret others.  Please give [place] 48 hours in order for your provider to thoroughly review all the results before contacting the office for clarification of your results.

## 2021-08-28 LAB — ANTI-SMOOTH MUSCLE ANTIBODY, IGG: Actin (Smooth Muscle) Antibody (IGG): <20 U (ref <20)

## 2021-08-28 LAB — ANTI-NUCLEAR AB-TITER (ANA TITER): ANA Titer 1: 1:40 {titer} — ABNORMAL HIGH

## 2021-08-28 LAB — HEPATITIS A ANTIBODY, TOTAL: Hepatitis A AB,Total: NONREACTIVE

## 2021-08-28 LAB — HEPATITIS B SURFACE ANTIBODY,QUALITATIVE: Hep B S Ab: NONREACTIVE

## 2021-08-28 LAB — ANA: Anti Nuclear Antibody (ANA): POSITIVE — AB

## 2021-08-28 LAB — IGG: IgG (Immunoglobin G), Serum: 1426 mg/dL (ref 600–1540)

## 2021-08-28 LAB — HEPATITIS B SURFACE ANTIGEN: Hepatitis B Surface Ag: NONREACTIVE

## 2021-10-31 ENCOUNTER — Ambulatory Visit: Payer: Medicare Other | Admitting: Internal Medicine

## 2021-10-31 ENCOUNTER — Ambulatory Visit (INDEPENDENT_AMBULATORY_CARE_PROVIDER_SITE_OTHER)
Admission: RE | Admit: 2021-10-31 | Discharge: 2021-10-31 | Disposition: A | Discharge status: Home / Self Care | Payer: Medicare Other | Source: Ambulatory Visit | Attending: Internal Medicine | Admitting: Internal Medicine

## 2021-10-31 ENCOUNTER — Encounter: Payer: Self-pay | Admitting: Internal Medicine

## 2021-10-31 VITALS — BP 150/80 | HR 75 | Ht 68.0 in | Wt 139.0 lb

## 2021-10-31 DIAGNOSIS — K5900 Constipation, unspecified: Secondary | ICD-10-CM

## 2021-10-31 DIAGNOSIS — R1030 Lower abdominal pain, unspecified: Secondary | ICD-10-CM

## 2021-10-31 DIAGNOSIS — K760 Fatty (change of) liver, not elsewhere classified: Secondary | ICD-10-CM | POA: Diagnosis not present

## 2021-10-31 MED ORDER — DICYCLOMINE HCL 10 MG PO CAPS: 10.0000 mg | ORAL_CAPSULE | Freq: Four times a day (QID) | ORAL | 2 refills | Status: DC | PRN

## 2021-10-31 NOTE — Progress Notes (Signed)
10/31/2021 [name] [id number] 08-11-1950   CHIEF COMPLAINT: Lower abdominal pain   HISTORY OF PRESENT ILLNESS: [name] is a 71 year old male with a past medical history of depression, hypertension, DM II, TIA, atypical chest pain with normal cardiac cath in 2005 and normal nuclear stress test 2012, 2016 and 02/2017, prostate cancer s/p prostatectomy 2006, fatty liver, GERD, diverticulosis and colon polyps presents for follow up of lower abdominal pain  Interval History: Patient has continued to have lower ab pain as well as midline lower back pain. This is persistent over time. The pain has been stable in quality, not worse than prior. Denies upper ab pain. He does feel like his constipation is slightly better controlled. He is taking Ex-Lax to have a BM. He is having one BM once a day on average. Having a BM does not seem to help or worsen the pain. He is still on Mounjaro, and his last dose was a few days ago. Linzess did not help with his ab pain so he has stopped this. His only abdominal surgery is a prostatectomy   Outpatient Encounter Medications as of 10/31/2021  Medication Sig   amLODipine (NORVASC) 5 MG tablet Take 1 tablet (5 mg total) by mouth daily.   dicyclomine (BENTYL) 10 MG capsule Take 1 capsule (10 mg total) by mouth 4 (four) times daily as needed for spasms.   glipiZIDE (GLUCOTROL) 5 MG tablet Take 5 mg by mouth 2 (two) times daily before a meal.   losartan-hydrochlorothiazide (HYZAAR) 50-12.5 MG tablet Take 1 tablet by mouth daily.   pravastatin (PRAVACHOL) 20 MG tablet Take 1 tablet (20 mg total) by mouth every evening.   glipiZIDE-metformin (METAGLIP) 5-500 MG tablet Take 1 tablet by mouth 2 (two) times daily.   linaclotide (LINZESS) 145 MCG CAPS capsule Take 1 capsule (145 mcg total) by mouth daily before breakfast. (Patient not taking: Reported on 10/31/2021)   MOUNJARO 2.5 MG/0.5ML Pen SMARTSIG:2.5 Milligram(s) SUB-Q Once a Week (Patient not taking:  Reported on 10/31/2021)   pantoprazole (PROTONIX) 40 MG tablet Take 40 mg by mouth 2 (two) times daily.   No facility-administered encounter medications on file as of 10/31/2021.   PHYSICAL EXAM: BP (!) 150/80   Pulse 75   Ht '5\' 8"'$  (1.727 m)   Wt 139 lb (63 kg)   SpO2 100%   BMI 21.13 kg/m  General: Alert. NAD Head: Normocephalic and atraumatic.  Lungs: normal effort Heart: Regular rate Abdomen: Soft, nontender, distended. Tender in the lower abdomen.  Skin: Warm and dry. No rash or lesions on visible extremities. Extremities: No edema. Neurological: Alert oriented x 4, no focal deficits.  Psychological:  Alert and cooperative. Normal mood and affect.  Labs 04/2021: CBC, CMP, and CRP unremarkable  Labs 07/2021: ALT is mildly elevated at 52.  Labs 08/2021: ANA positive at 1:40. Iron studies with mildly elevated ferritin. IgG nml. ASMA negative. Hep A Ab NR. Hep B surface antibody NR. Hep B surface antigen NR.   CT A/P w/contrast 11/25/20: IMPRESSION: 1. No acute findings in the abdomen. 2. Diffuse hepatic steatosis. 3. Descending colonic diverticulosis without findings of acute diverticulitis. 4. Aortic Atherosclerosis (ICD10-I70.0).  CT A/P w/contrast 05/08/21: IMPRESSION: 1. No acute abnormality of the abdomen or pelvis. 2. Left-sided colonic diverticulosis without findings of acute diverticulitis. 3. Hepatic steatosis. 4. Aortic Atherosclerosis (ICD10-I70.0).  Colonoscopy 02/21/12: External and internal hemorrhoids. Diverticulosis in the sigmoid colon, distal descending colon, and cecum. Path: Surgical [P], transverse  colon polyp - NO COLONIC TISSUE IDENTIFIED, SEE COMMENT. Microscopic Comment There is a minute fragment of biopsy material present in the specimen container/formalin solution. Following routine specimen processing, there is no colonic tissue identified. (CR:kh 02-26-12)  Colonoscopy 05/22/21: - The examined portion of the ileum was normal. - Diverticulosis  in the entire examined colon. - Two 3 to 5 mm polyps in the ascending colon and in the cecum, removed with a cold snare. Resected and retrieved. - Two 1 to 2 mm polyps in the ascending colon, removed with a cold biopsy forceps. Resected and retrieved. - Non-bleeding internal hemorrhoids. Path: Surgical [P], colon, ascending and cecum, polyp (4) TUBULAR ADENOMAS NEGATIVE FOR HIGH-GRADE DYSPLASIA AND CARCINOMA  ASSESSMENT AND PLAN: Lower abdominal pain Constipation Fatty liver History of colon polyps Patient presents with continued lower abdominal pain. Recent work up with labs, imaging, and colonoscopy were unrevealing for an obvious source of his ab pain. Despite treatment of his constipation, it does appear that the patient has experienced significant improvement in his lab pain. Linzess did not help with his pain. Patient states that he has one more dose of Mounjaro and then will likely stop the medication for a while to see if this helps with his pain. I will trial him on an antispasmodic to see if this helps with his pain, and will check a KUB to assess his current stool burden. - Bentyl 10 mg QID PRN - KUB  - Patient is getting Hep A and Hep B vaccines with PCP - Patient plans to do a trial of discontinuing Mounjaro - Next colonoscopy due in 05/2024 - RTC 2 months

## 2021-10-31 NOTE — Patient Instructions (Addendum)
Your provider has requested that you have an abdominal x ray before leaving today. Please go to the basement floor to our Radiology department for the test.  We have sent the following medications to your pharmacy for you to pick up at your convenience: Bentyl 10 mg four times daily 20-30 minutes before meals and bedtime.   _______________________________________________________  If you are age 73 or older, your body mass index should be between 23-30. Your Body mass index is 21.13 kg/m. If this is out of the aforementioned range listed, please consider follow up with your Primary Care Provider.  If you are age 52 or younger, your body mass index should be between 19-25. Your Body mass index is 21.13 kg/m. If this is out of the aformentioned range listed, please consider follow up with your Primary Care Provider.   ________________________________________________________  The [place] [facility] providers would like to encourage you to use [facility] to communicate with providers for non-urgent requests or questions.  Due to long hold times on the telephone, sending your provider a message by [facility] may be a faster and more efficient way to get a response.  Please allow 48 business hours for a response.  Please remember that this is for non-urgent requests.  _______________________________________________________   Thank you for entrusting me with your care and for choosing [facility], Dr. Christia Reading

## 2021-11-13 ENCOUNTER — Telehealth: Payer: Self-pay | Admitting: Internal Medicine

## 2021-11-13 NOTE — Telephone Encounter (Signed)
Patient came in office this morning requesting a call back from the nurse to discuss his X-Ray results from 10/10. Please advise.

## 2021-11-13 NOTE — Telephone Encounter (Signed)
Reviewed the findings of the KUB. Read the recommendation to the patient. No further questions.

## 2022-01-01 ENCOUNTER — Ambulatory Visit: Payer: Medicare Other | Admitting: Internal Medicine

## 2022-01-04 ENCOUNTER — Telehealth: Payer: Self-pay | Admitting: Internal Medicine

## 2022-01-04 NOTE — Telephone Encounter (Signed)
Inbound call from patient stating that he is having abd pain and dark stools and is requesting a call back to discuss and to see if he can get a sooner appointment than 1/24 at 9:50. Please advise.

## 2022-01-05 NOTE — Telephone Encounter (Signed)
Spoke with the patient. His abdominal pain is consistent and is located in the lower abdominal area. Nothing increases the pain. Nothing relieves the pain. Feels the pain into his low back. No frank blood with stools. Stools are dark in color. Denies nausea, belching, fevers, and weakness/tiredness. Appointment moved to 01/09/22 at 8:30 am.

## 2022-01-09 ENCOUNTER — Other Ambulatory Visit (INDEPENDENT_AMBULATORY_CARE_PROVIDER_SITE_OTHER): Payer: Medicare Other

## 2022-01-09 ENCOUNTER — Ambulatory Visit (INDEPENDENT_AMBULATORY_CARE_PROVIDER_SITE_OTHER): Payer: Medicare Other | Admitting: Internal Medicine

## 2022-01-09 ENCOUNTER — Encounter: Payer: Self-pay | Admitting: Internal Medicine

## 2022-01-09 VITALS — BP 150/90 | HR 77 | Ht 68.0 in | Wt 138.1 lb

## 2022-01-09 DIAGNOSIS — R1030 Lower abdominal pain, unspecified: Secondary | ICD-10-CM | POA: Diagnosis not present

## 2022-01-09 DIAGNOSIS — K921 Melena: Secondary | ICD-10-CM | POA: Diagnosis not present

## 2022-01-09 DIAGNOSIS — K5900 Constipation, unspecified: Secondary | ICD-10-CM

## 2022-01-09 LAB — CBC WITH DIFFERENTIAL/PLATELET
Basophils Absolute: 0 10*3/uL (ref 0.0–0.1)
Basophils Relative: 0.6 % (ref 0.0–3.0)
Eosinophils Absolute: 0 10*3/uL (ref 0.0–0.7)
Eosinophils Relative: 0.6 % (ref 0.0–5.0)
HCT: 43.9 % (ref 39.0–52.0)
Hemoglobin: 15.2 g/dL (ref 13.0–17.0)
Lymphocytes Relative: 24 % (ref 12.0–46.0)
Lymphs Abs: 1.7 10*3/uL (ref 0.7–4.0)
MCHC: 34.6 g/dL (ref 30.0–36.0)
MCV: 93.1 fl (ref 78.0–100.0)
Monocytes Absolute: 0.5 10*3/uL (ref 0.1–1.0)
Monocytes Relative: 7 % (ref 3.0–12.0)
Neutro Abs: 4.9 10*3/uL (ref 1.4–7.7)
Neutrophils Relative %: 67.8 % (ref 43.0–77.0)
Platelets: 234 10*3/uL (ref 150.0–400.0)
RBC: 4.72 Mil/uL (ref 4.22–5.81)
RDW: 15 % (ref 11.5–15.5)
WBC: 7.2 10*3/uL (ref 4.0–10.5)

## 2022-01-09 LAB — IBC + FERRITIN
Ferritin: 240.9 ng/mL (ref 22.0–322.0)
Iron: 91 ug/dL (ref 42–165)
Saturation Ratios: 28.8 % (ref 20.0–50.0)
TIBC: 316.4 ug/dL (ref 250.0–450.0)
Transferrin: 226 mg/dL (ref 212.0–360.0)

## 2022-01-09 NOTE — Progress Notes (Signed)
01/09/2022 [name] [id number] 1950/07/19   CHIEF COMPLAINT: Lower abdominal pain   HISTORY OF PRESENT ILLNESS: [name] is a 71 year old male with a past medical history of depression, hypertension, DM II, TIA, atypical chest pain with normal cardiac cath in 2005 and normal nuclear stress test 2012, 2016 and 02/2017, prostate cancer s/p prostatectomy 2006, fatty liver, GERD, diverticulosis and colon polyps presents for follow up of lower abdominal pain  Interval History: He has had black stools over the last several weeks. The stools have not bene tarry or sticky. He has been using the Pepto Bismol every few days, which has helped somewhat. Denies hematochezia. He is still feeling the ab pain in the lower abdomen. This ab pain has been stable over time. He is still able to perform his daily activities despite the pain. He is still working. Ex-lax has helped him go to the bathroom. He is having one BM per day on average. It is not clear if the dicyclomine has been helping.   Outpatient Encounter Medications as of 01/09/2022  Medication Sig   amLODipine (NORVASC) 5 MG tablet Take 1 tablet (5 mg total) by mouth daily.   dicyclomine (BENTYL) 10 MG capsule Take 1 capsule (10 mg total) by mouth 4 (four) times daily as needed for spasms.   glipiZIDE (GLUCOTROL) 5 MG tablet Take 5 mg by mouth 2 (two) times daily before a meal.   losartan-hydrochlorothiazide (HYZAAR) 50-12.5 MG tablet Take 1 tablet by mouth daily.   pantoprazole (PROTONIX) 40 MG tablet Take 40 mg by mouth 2 (two) times daily.   pravastatin (PRAVACHOL) 20 MG tablet Take 1 tablet (20 mg total) by mouth every evening.   glipiZIDE-metformin (METAGLIP) 5-500 MG tablet Take 1 tablet by mouth 2 (two) times daily. (Patient not taking: Reported on 01/09/2022)   linaclotide (LINZESS) 145 MCG CAPS capsule Take 1 capsule (145 mcg total) by mouth daily before breakfast. (Patient not taking: Reported on 10/31/2021)   MOUNJARO 2.5  MG/0.5ML Pen SMARTSIG:2.5 Milligram(s) SUB-Q Once a Week (Patient not taking: Reported on 10/31/2021)   No facility-administered encounter medications on file as of 01/09/2022.   PHYSICAL EXAM: BP (!) 150/90 (BP Location: Left Arm, Patient Position: Sitting, Cuff Size: Normal)   Pulse 77   Ht '5\' 8"'$  (1.727 m)   Wt 138 lb 2 oz (62.7 kg)   SpO2 99%   BMI 21.00 kg/m  General: Alert. NAD Head: Normocephalic and atraumatic.  Lungs: normal effort Heart: Regular rate Abdomen: Soft, nontender, distended. Tender in the lower abdomen.  Skin: Warm and dry. No rash or lesions on visible extremities. Extremities: No edema. Neurological: Alert oriented x 4, no focal deficits.  Psychological:  Alert and cooperative. Normal mood and affect.  Labs 04/2021: CBC, CMP, and CRP unremarkable  Labs 07/2021: ALT is mildly elevated at 52.  Labs 08/2021: ANA positive at 1:40. Iron studies with mildly elevated ferritin. IgG nml. ASMA negative. Hep A Ab NR. Hep B surface antibody NR. Hep B surface antigen NR.   CT A/P w/contrast 11/25/20: IMPRESSION: 1. No acute findings in the abdomen. 2. Diffuse hepatic steatosis. 3. Descending colonic diverticulosis without findings of acute diverticulitis. 4. Aortic Atherosclerosis (ICD10-I70.0).  CT A/P w/contrast 05/08/21: IMPRESSION: 1. No acute abnormality of the abdomen or pelvis. 2. Left-sided colonic diverticulosis without findings of acute diverticulitis. 3. Hepatic steatosis. 4. Aortic Atherosclerosis (ICD10-I70.0).  KUB 10/31/21: IMPRESSION: Moderate fecal loading in the colon, particularly the ascending colon. No other  abnormalities.  Colonoscopy 02/21/12: External and internal hemorrhoids. Diverticulosis in the sigmoid colon, distal descending colon, and cecum. Path: Surgical [P], transverse colon polyp - NO COLONIC TISSUE IDENTIFIED, SEE COMMENT. Microscopic Comment There is a minute fragment of biopsy material present in the specimen  container/formalin solution. Following routine specimen processing, there is no colonic tissue identified. (CR:kh 02-26-12)  Colonoscopy 05/22/21: - The examined portion of the ileum was normal. - Diverticulosis in the entire examined colon. - Two 3 to 5 mm polyps in the ascending colon and in the cecum, removed with a cold snare. Resected and retrieved. - Two 1 to 2 mm polyps in the ascending colon, removed with a cold biopsy forceps. Resected and retrieved. - Non-bleeding internal hemorrhoids. Path: Surgical [P], colon, ascending and cecum, polyp (4) TUBULAR ADENOMAS NEGATIVE FOR HIGH-GRADE DYSPLASIA AND CARCINOMA  ASSESSMENT AND PLAN: Melena Lower abdominal pain Constipation Fatty liver History of colon polyps Patient presents with continued lower abdominal pain and the recent development of melena. Recent KUB does show significant stool burden. Will have him try Linzess at145 mcg QD. He was previously on Linzess 72 mcg QD, but perhaps he was not on an adequate dosage in order to help with constipation and ab pain. Bentyl has not made a difference. Over the last few weeks patient has been noticing some melena of unclear etiology. The black stools may be related to some Pepto Bismol usage, but would not necessarily expect Pepto Bismol every few days to cause continuous black stools. Will plan for an EGD to see if there is a source of upper GI blood loss. Will also check his blood counts and iron levels to see if there is substantial amounts of blood loss. - Check CBC, ferritin/IBC - Okay to stop Bentyl if it is not helping - Start Linzess 145 mcg QD - EGD [facility] - Next colonoscopy due in 05/2024  I spent 35 minutes of time, including in depth chart review, independent review of results as outlined above, communicating results with the patient directly, face-to-face time with the patient, coordinating care, and ordering studies and medications as appropriate, and documentation.

## 2022-01-09 NOTE — Patient Instructions (Addendum)
_______________________________________________________  If you are age 73 or older, your body mass index should be between 23-30. Your Body mass index is 21 kg/m. If this is out of the aforementioned range listed, please consider follow up with your Primary Care Provider. ________________________________________________________  The [place] [facility] providers would like to encourage you to use [facility] to communicate with providers for non-urgent requests or questions.  Due to long hold times on the telephone, sending your provider a message by [facility] may be a faster and more efficient way to get a response.  Please allow 48 business hours for a response.  Please remember that this is for non-urgent requests.  _______________________________________________________ We have sent the following medications to your pharmacy for you to pick up at your convenience:  INCREASE: Linzess 125mg one daily before breakfast daily.   Your provider has requested that you go to the basement level for lab work before leaving today. Press "B" on the elevator. The lab is located at the first door on the left as you exit the elevator.  You have been scheduled for an endoscopy. Please follow written instructions given to you at your visit today. If you use inhalers (even only as needed), please bring them with you on the day of your procedure.  Due to recent changes in healthcare laws, you may see the results of your imaging and laboratory studies on MyChart before your provider has had a chance to review them.  We understand that in some cases there may be results that are confusing or concerning to you. Not all laboratory results come back in the same time frame and the provider may be waiting for multiple results in order to interpret others.  Please give [id number] hours in order for your provider to thoroughly review all the results before contacting the office for clarification of your results.   Thank you for entrusting  me with your care and [facility]  Dr DLorenso Courier

## 2022-01-12 ENCOUNTER — Ambulatory Visit (AMBULATORY_SURGERY_CENTER): Payer: Medicare Other | Admitting: Internal Medicine

## 2022-01-12 ENCOUNTER — Telehealth: Payer: Self-pay | Admitting: Internal Medicine

## 2022-01-12 ENCOUNTER — Encounter: Payer: Self-pay | Admitting: Internal Medicine

## 2022-01-12 VITALS — BP 134/76 | HR 78 | Temp 96.6°F | Resp 15 | Ht 68.0 in | Wt 139.0 lb

## 2022-01-12 DIAGNOSIS — R1030 Lower abdominal pain, unspecified: Secondary | ICD-10-CM

## 2022-01-12 DIAGNOSIS — K2100 Gastro-esophageal reflux disease with esophagitis, without bleeding: Secondary | ICD-10-CM

## 2022-01-12 DIAGNOSIS — K449 Diaphragmatic hernia without obstruction or gangrene: Secondary | ICD-10-CM

## 2022-01-12 DIAGNOSIS — K209 Esophagitis, unspecified without bleeding: Secondary | ICD-10-CM

## 2022-01-12 DIAGNOSIS — K921 Melena: Secondary | ICD-10-CM

## 2022-01-12 DIAGNOSIS — K229 Disease of esophagus, unspecified: Secondary | ICD-10-CM

## 2022-01-12 MED ORDER — SODIUM CHLORIDE 0.9 % IV SOLN: 500.0000 mL | INTRAVENOUS | Status: DC

## 2022-01-12 MED ORDER — SUCRALFATE 1 GM/10ML PO SUSP: 1.0000 g | Freq: Four times a day (QID) | ORAL | 1 refills | Status: DC

## 2022-01-12 MED ORDER — SUCRALFATE 1 GM/10ML PO SUSP: 1.0000 g | Freq: Two times a day (BID) | ORAL | 1 refills | Status: DC

## 2022-01-12 NOTE — Op Note (Signed)
[place] Patient Name: [name] Procedure Date: 01/12/2022 8:18 AM MRN: [id number] Endoscopist: Adline Mango [place] , , [id number] Age: 71 Referring MD:  Date of Birth: [birth year] Gender: Male Account #: [id number] Procedure:                Upper GI endoscopy Indications:              Melena Medicines:                Monitored Anesthesia Care Procedure:                Pre-Anesthesia Assessment:                           - Prior to the procedure, a History and Physical                            was performed, and patient medications and                            allergies were reviewed. The patient's tolerance of                            previous anesthesia was also reviewed. The risks                            and benefits of the procedure and the sedation                            options and risks were discussed with the patient.                            All questions were answered, and informed consent                            was obtained. Prior Anticoagulants: The patient has                            taken no anticoagulant or antiplatelet agents. ASA                            Grade Assessment: III - A patient with severe                            systemic disease. After reviewing the risks and                            benefits, the patient was deemed in satisfactory                            condition to undergo the procedure.                           After obtaining informed consent, the endoscope was  passed under direct vision. Throughout the                            procedure, the patient's blood pressure, pulse, and                            oxygen saturations were monitored continuously. The                            GIF [id number] #[id number] was introduced through the                            mouth, and advanced to the second part of duodenum.                            The upper GI endoscopy was accomplished  without                            difficulty. The patient tolerated the procedure                            well. Scope In: Scope Out: Findings:                 LA Grade A (one or more mucosal breaks less than 5                            mm, not extending between tops of 2 mucosal folds)                            esophagitis associated with some white plaques with                            no bleeding was found in the distal esophagus.                            Biopsies were taken with a cold forceps for                            histology.                           A 2 cm hiatal hernia was present.                           The exam of the stomach was otherwise normal.                           The examined duodenum was normal. Complications:            No immediate complications. Estimated Blood Loss:     Estimated blood loss was minimal. Impression:               - LA Grade A reflux esophagitis with no bleeding.  Biopsied.                           - 2 cm hiatal hernia.                           - Normal examined duodenum. Recommendation:           - Discharge patient to home (with escort).                           - Await pathology results.                           - Use Pepcid (famotidine) 20 mg PO BID.                           - Use sucralfate suspension 1 gram PO QID for 2                            weeks.                           - Return to GI clinic in 8 weeks.                           - The findings and recommendations were discussed                            with the patient. Dr Georgian Co "[place]" [place],  01/12/2022 9:00:45 AM

## 2022-01-12 NOTE — Telephone Encounter (Signed)
Spoke with [name] and states he took the Sucralfate sometime between 12-1p and he developed hives/rash shortly after taking the medication. He denies any difficulty breathing or lip/tongue swelling. States he does not have Benadryl available right now but he had someone going to get some for him. Instructed [name] to take Benadryl for the rash and that if he developed any respiratory symptoms to seek medical care immediately. Discussed with Dr. Carlean Purl and he agreed with recommendations. Will notify Dr. Lorenso Courier for further instructions on a possible substitute for the Sucralfate.

## 2022-01-12 NOTE — Progress Notes (Signed)
GASTROENTEROLOGY PROCEDURE H&P NOTE   Primary Care Physician: Cipriano Mile, NP    Reason for Procedure:   Melena  Plan:    EGD  Patient is appropriate for endoscopic procedure(s) in the ambulatory ([place]) setting.  The nature of the procedure, as well as the risks, benefits, and alternatives were carefully and thoroughly reviewed with the patient. Ample time for discussion and questions allowed. The patient understood, was satisfied, and agreed to proceed.     HPI: [name] is a 71 y.o. male who presents for EGD for evaluation of melena .  Patient was most recently seen in the Gastroenterology Clinic on 01/09/22.  No interval change in medical history since that appointment. Please refer to that note for full details regarding GI history and clinical presentation.   Past Medical History:  Diagnosis Date   Depression    Diabetes mellitus type 2, noninsulin dependent ([place]) 01/13/2011   Diverticulosis    Esophageal spasm    Essential hypertension    Fatty liver    GERD (gastroesophageal reflux disease) 01/13/2011   History of cardiac cath    a. 2005: normal cors, normal EF. b. Nuc 12/2010: normal, EF 61%. 2D Echo 12/2010: EF normal (% not given), no RWMA, trivial AI, aortic root trivially dilated, mild MR.   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 Authorizing Provider  amLODipine (NORVASC) 5 MG tablet Take 1 tablet (5 mg total) by mouth daily. 08/04/21  Yes Nahser, Wonda Cheng, MD  [employer] SENNA PLUS 8.6-50  MG tablet Take 2 tablets by mouth at bedtime. 10/12/21  Yes [provider]  dicyclomine (BENTYL) 10 MG capsule Take 1 capsule (10 mg total) by mouth 4 (four) times daily as needed for spasms. 10/31/21  Yes Sharyn Creamer, MD  glipiZIDE (GLUCOTROL) 5 MG tablet Take 5 mg by mouth 2 (two) times daily before a meal.   Yes [provider]  glipiZIDE-metformin (METAGLIP) 5-500 MG tablet Take 1 tablet by mouth 2 (two) times daily. 09/04/21  Yes [provider]  linaclotide Rolan Lipa) 145 MCG CAPS capsule Take 1 capsule (145 mcg total) by mouth daily before breakfast. 08/23/21  Yes Sharyn Creamer, MD  losartan-hydrochlorothiazide (HYZAAR) 50-12.5 MG tablet Take 1 tablet by mouth daily. 08/04/21  Yes Nahser, Wonda Cheng, MD  pantoprazole (PROTONIX) 40 MG tablet Take 40 mg by mouth 2 (two) times daily. 10/12/21  Yes [provider]  pravastatin (PRAVACHOL) 20 MG tablet Take 1 tablet (20 mg total) by mouth every evening. 08/04/21  Yes Nahser, Wonda Cheng, MD  MOUNJARO 2.5 MG/0.5ML Pen SMARTSIG:2.5 Milligram(s) SUB-Q Once a Week Patient not taking: Reported on 10/31/2021 04/13/21   [provider]    Current Outpatient Medications  Medication Sig Dispense Refill   amLODipine (NORVASC) 5 MG tablet Take 1 tablet (5 mg total) by mouth daily. 90 tablet 3   [employer] SENNA PLUS 8.6-50 MG tablet Take 2 tablets by mouth at  bedtime.     dicyclomine (BENTYL) 10 MG capsule Take 1 capsule (10 mg total) by mouth 4 (four) times daily as needed for spasms. 90 capsule 2   glipiZIDE (GLUCOTROL) 5 MG tablet Take 5 mg by mouth 2 (two) times daily before a meal.     glipiZIDE-metformin (METAGLIP) 5-500 MG tablet Take 1 tablet by mouth 2 (two) times daily.     linaclotide (LINZESS) 145 MCG CAPS capsule Take 1 capsule (145 mcg total) by mouth daily before breakfast. 30 capsule 5   losartan-hydrochlorothiazide (HYZAAR) 50-12.5 MG tablet Take 1 tablet by mouth daily. 90 tablet 3   pantoprazole (PROTONIX)  40 MG tablet Take 40 mg by mouth 2 (two) times daily.     pravastatin (PRAVACHOL) 20 MG tablet Take 1 tablet (20 mg total) by mouth every evening. 90 tablet 3   MOUNJARO 2.5 MG/0.5ML Pen SMARTSIG:2.5 Milligram(s) SUB-Q Once a Week (Patient not taking: Reported on 10/31/2021)     Current Facility-Administered Medications  Medication Dose Route Frequency Provider Last Rate Last Admin   0.9 %  sodium chloride infusion  500 mL Intravenous Continuous Sharyn Creamer, MD        Allergies as of 01/12/2022 - Review Complete 01/12/2022  Allergen Reaction Noted   Iohexol Nausea And Vomiting 04/27/2013   Jardiance [empagliflozin]  02/28/2018    Family History  Problem Relation Age of Onset   Cancer Mother    Colon cancer Father    COPD Sister    Hypertension Sister    Stroke Brother        [place] and hit head, ? stroke   CAD Brother        Bypass in his 14's.   Stomach cancer Neg Hx    Esophageal cancer Neg Hx    Colon polyps Neg Hx     Social History   Socioeconomic History   Marital status: Divorced    Spouse name: Not on file   Number of children: 5   Years of education: 12   Highest education level: Not on file  Occupational History   Occupation: BUS DRIVER    Employer: [employer]  Tobacco Use   Smoking status: Never   Smokeless tobacco: Never  Vaping Use   Vaping Use: Never used  Substance and Sexual Activity   Alcohol use: No   Drug use: No   Sexual activity: Not on file  Other Topics Concern   Not on file  Social History Narrative   Patient is divorced and lives alone.   Patient has five children.   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     Patient has a high school education.   Patient is right-handed.   Patient drinks two cups of tea daily.   Social Determinants of Health   Financial Resource Strain: Not on file  Food Insecurity: Not on file  Transportation Needs: Not on file  Physical Activity: Not on file  Stress:  Not on file  Social Connections: Not on file  Intimate Partner Violence: Not on file    Physical Exam: Vital signs in last 24 hours: BP (!) 140/81   Pulse 100   Temp (!) 96.6 F (35.9 C) (Temporal)   Ht '5\' 8"'$  (1.727 m)   Wt 139 lb (63 kg)   SpO2 99%   BMI 21.13 kg/m  GEN: NAD EYE: Sclerae anicteric ENT: MMM CV: Non-tachycardic Pulm: No increased WOB GI: Soft NEURO:  Alert &  Oriented   Christia Reading, MD [facility]   01/12/2022 8:17 AM

## 2022-01-12 NOTE — Progress Notes (Signed)
Pt's states no medical or surgical changes since previsit or office visit. 

## 2022-01-12 NOTE — Progress Notes (Signed)
Sedate, gd SR, tolerated procedure well, VSS, report to RN 

## 2022-01-12 NOTE — Progress Notes (Signed)
Adding Sucralfate to pt's allergy list. He called stating he developed a rash/hives shortly after taking the medication for the first time.

## 2022-01-12 NOTE — Patient Instructions (Addendum)
Discharge patient to home (with escort). - Await pathology results. - Use Pepcid (famotidine) 20 mg PO BID. - Use sucralfate suspension 1 gram PO QID for 2 weeks. - Return to GI clinic in 8 weeks. - The findings and recommendations were discussed with the patient.  Information on hiatal hernia and esophagitis given to you today.   YOU HAD AN ENDOSCOPIC PROCEDURE TODAY AT [facility]:   Refer to the procedure report that was given to you for any specific questions about what was found during the examination.  If the procedure report does not answer your questions, please call your gastroenterologist to clarify.  If you requested that your care partner not be given the details of your procedure findings, then the procedure report has been included in a sealed envelope for you to review at your convenience later.  YOU SHOULD EXPECT: Some feelings of bloating in the abdomen. Passage of more gas than usual.  Walking can help get rid of the air that was put into your GI tract during the procedure and reduce the bloating. If you had a lower endoscopy (such as a colonoscopy or flexible sigmoidoscopy) you may notice spotting of blood in your stool or on the toilet paper. If you underwent a bowel prep for your procedure, you may not have a normal bowel movement for a few days.  Please Note:  You might notice some irritation and congestion in your nose or some drainage.  This is from the oxygen used during your procedure.  There is no need for concern and it should clear up in a day or so.  SYMPTOMS TO REPORT IMMEDIATELY:  Following upper endoscopy (EGD)  Vomiting of blood or coffee ground material  New chest pain or pain under the shoulder blades  Painful or persistently difficult swallowing  New shortness of breath  Fever of 100F or higher  Black, tarry-looking stools  For urgent or emergent issues, a gastroenterologist can be reached at any hour by calling [phone]. Do not  use MyChart messaging for urgent concerns.    DIET:  We do recommend a small meal at first, but then you may proceed to your regular diet.  Drink plenty of fluids but you should avoid alcoholic beverages for 24 hours.  ACTIVITY:  You should plan to take it easy for the rest of today and you should NOT DRIVE or use heavy machinery until tomorrow (because of the sedation medicines used during the test).    FOLLOW UP: Our staff will call the number listed on your records the next business day following your procedure.  We will call around 7:15- 8:00 am to check on you and address any questions or concerns that you may have regarding the information given to you following your procedure. If we do not reach you, we will leave a message.     If any biopsies were taken you will be contacted by phone or by letter within the next 1-3 weeks.  Please call us at [phone] if you have not heard about the biopsies in 3 weeks.    SIGNATURES/CONFIDENTIALITY: You and/or your care partner have signed paperwork which will be entered into your electronic medical record.  These signatures attest to the fact that that the information above on your After Visit Summary has been reviewed and is understood.  Full responsibility of the confidentiality of this discharge information lies with you and/or your care-partner.

## 2022-01-12 NOTE — Progress Notes (Signed)
Called to room to assist during endoscopic procedure.  Patient ID and intended procedure confirmed with present staff. Received instructions for my participation in the procedure from the performing physician.  

## 2022-01-12 NOTE — Telephone Encounter (Signed)
Spoke with the patient. He has taken benadryl and reports he is feeling better.

## 2022-01-12 NOTE — Telephone Encounter (Signed)
Inbound call from patient stating that he had an EGD this morning and was proscribed  Sucralfate so he went to the pharmacy to get medication and took it shortly after he got home. Patient stated that he has started to break out in hives. Patient is requesting a call back to discuss. Please advise.

## 2022-01-16 NOTE — Telephone Encounter (Signed)
Spoke with the patient. States he is better. No longer itching or breaking out. He understands he is not to take Carafate. It is added to the list of medication allergies.

## 2022-01-17 ENCOUNTER — Telehealth: Payer: Self-pay | Admitting: *Deleted

## 2022-01-17 NOTE — Telephone Encounter (Signed)
  Follow up Call-     01/12/2022    7:37 AM 05/22/2021    9:18 AM  Call back number  Post procedure Call Back phone  # [phone]  Permission to leave phone message Yes Yes     Patient questions:  Do you have a fever, pain , or abdominal swelling? Yes.   The pain that the patient was having before the procedure is still there. Pain Score  3 *  Have you tolerated food without any problems? Yes.    Have you been able to return to your normal activities? Yes.    Do you have any questions about your discharge instructions: Diet   No. Medications  No. Follow up visit  No.  Do you have questions or concerns about your Care? No.  Actions: * If pain score is 4 or above: No action needed, pain <4.

## 2022-01-18 ENCOUNTER — Encounter: Payer: Self-pay | Admitting: Internal Medicine

## 2022-01-29 ENCOUNTER — Telehealth: Payer: Self-pay | Admitting: Internal Medicine

## 2022-01-29 NOTE — Telephone Encounter (Signed)
PT is calling to get results of procedure on 01/12/2022. Please advise. Thank you

## 2022-01-29 NOTE — Telephone Encounter (Signed)
Spoke with the patient. Explained his results as per the letter mailed to the patient with his biopsy report. Reviewed his medications. He has been taking the Pantoprazole PRN and has not tried the Bentyl at all. Patient complains of ongoing abdominal pain. He has taken Pepto-Bismol for his pain, which gives him temporary relief. Briefly discuss the indications for these medications.  By teach back method he understands to take the Pantoprazole daily and he will take the Bentyl PRN. Advised of his follow up appointment. Per his request, an appointment card will be mailed to him.

## 2022-02-14 ENCOUNTER — Ambulatory Visit: Payer: Medicare Other | Admitting: Internal Medicine

## 2022-02-14 ENCOUNTER — Encounter: Payer: Self-pay | Admitting: Internal Medicine

## 2022-02-14 VITALS — BP 130/62 | HR 78 | Ht 68.0 in | Wt 138.0 lb

## 2022-02-14 DIAGNOSIS — R1030 Lower abdominal pain, unspecified: Secondary | ICD-10-CM | POA: Diagnosis not present

## 2022-02-14 DIAGNOSIS — Z860100 Personal history of colon polyps, unspecified: Secondary | ICD-10-CM

## 2022-02-14 DIAGNOSIS — K5900 Constipation, unspecified: Secondary | ICD-10-CM | POA: Diagnosis not present

## 2022-02-14 DIAGNOSIS — K760 Fatty (change of) liver, not elsewhere classified: Secondary | ICD-10-CM

## 2022-02-14 DIAGNOSIS — K921 Melena: Secondary | ICD-10-CM | POA: Diagnosis not present

## 2022-02-14 NOTE — Patient Instructions (Signed)
______________________________________________________  If you are age 72 or older, your body mass index should be between 23-30. Your Body mass index is 20.98 kg/m. If this is out of the aforementioned range listed, please consider follow up with your Primary Care Provider. ________________________________________________________  The [place] [facility] providers would like to encourage you to use [facility] to communicate with providers for non-urgent requests or questions.  Due to long hold times on the telephone, sending your provider a message by [facility] may be a faster and more efficient way to get a response.  Please allow 48 business hours for a response.  Please remember that this is for non-urgent requests.  _______________________________________________________  Please discontinue Linzess and sucralfate.  You can try prunes for constipation.  You will need a follow up appointment in 6 months (July 2024).  Please contact our office for an appointment.  Thank you for entrusting me with your care and choosing [facility].  Dr Lorenso Courier

## 2022-02-14 NOTE — Progress Notes (Signed)
02/14/2022 DAYLAN JUHNKE [id number] 1950-03-03   CHIEF COMPLAINT: Lower abdominal pain   HISTORY OF PRESENT ILLNESS: MCDONALD REILING is a 72 year old male with a past medical history of depression, hypertension, DM II, TIA, atypical chest pain with normal cardiac cath in 2005 and normal nuclear stress test 2012, 2016 and 02/2017, prostate cancer s/p prostatectomy 2006, fatty liver, GERD, diverticulosis and colon polyps presents for follow up of lower abdominal pain  Interval History: He is still having ab pain. He was not able to take the sucralfate because it caused him to develop a rash. He has still been taking his pantoprazole 40 mg BID, which he thinks is helping with his symptoms. Denies chest burning or regurgitation. Linzess is not really working for the constipation. He is using Ex-lax to help induce a BM. Stools are sometimes a little dark but overall this is better than prior. He has one BM once every other day on average. He has not been on the [facility] for several months. His blood sugar levels have been good.    Outpatient Encounter Medications as of 02/14/2022  Medication Sig   amLODipine (NORVASC) 5 MG tablet Take 1 tablet (5 mg total) by mouth daily.   [facility] SENNA PLUS 8.6-50 MG tablet Take 2 tablets by mouth at bedtime.   dicyclomine (BENTYL) 10 MG capsule Take 1 capsule (10 mg total) by mouth 4 (four) times daily as needed for spasms.   glipiZIDE (GLUCOTROL) 5 MG tablet Take 5 mg by mouth 2 (two) times daily before a meal.   glipiZIDE-metformin (METAGLIP) 5-500 MG tablet Take 1 tablet by mouth 2 (two) times daily.   linaclotide (LINZESS) 145 MCG CAPS capsule Take 1 capsule (145 mcg total) by mouth daily before breakfast.   losartan-hydrochlorothiazide (HYZAAR) 50-12.5 MG tablet Take 1 tablet by mouth daily.   MOUNJARO 2.5 MG/0.5ML Pen    pantoprazole (PROTONIX) 40 MG tablet Take 40 mg by mouth 2 (two) times daily.   pravastatin (PRAVACHOL) 20 MG tablet Take 1 tablet (20 mg  total) by mouth every evening.   sucralfate (CARAFATE) 1 GM/10ML suspension Take 10 mLs (1 g total) by mouth 4 (four) times daily.   No facility-administered encounter medications on file as of 02/14/2022.   PHYSICAL EXAM: BP 130/62   Pulse 78   Ht '5\' 8"'$  (1.727 m)   Wt 138 lb (62.6 kg)   BMI 20.98 kg/m  General: Alert. NAD Head: Normocephalic and atraumatic.  Lungs: normal effort Heart: Regular rate Abdomen: Soft, nontender, distended. Skin: Warm and dry. No rash or lesions on visible extremities. Extremities: No edema. Neurological: Alert oriented x 4, no focal deficits.  Psychological:  Alert and cooperative. Normal mood and affect.  Labs 04/2021: CBC, CMP, and CRP unremarkable  Labs 07/2021: ALT is mildly elevated at 52.  Labs 08/2021: ANA positive at 1:40. Iron studies with mildly elevated ferritin. IgG nml. ASMA negative. Hep A Ab NR. Hep B surface antibody NR. Hep B surface antigen NR.   CT A/P w/contrast 11/25/20: IMPRESSION: 1. No acute findings in the abdomen. 2. Diffuse hepatic steatosis. 3. Descending colonic diverticulosis without findings of acute diverticulitis. 4. Aortic Atherosclerosis (ICD10-I70.0).  CT A/P w/contrast 05/08/21: IMPRESSION: 1. No acute abnormality of the abdomen or pelvis. 2. Left-sided colonic diverticulosis without findings of acute diverticulitis. 3. Hepatic steatosis. 4. Aortic Atherosclerosis (ICD10-I70.0).  KUB 10/31/21: IMPRESSION: Moderate fecal loading in the colon, particularly the ascending colon. No other abnormalities.  Colonoscopy 02/21/12: External  and internal hemorrhoids. Diverticulosis in the sigmoid colon, distal descending colon, and cecum. Path: Surgical [P], transverse colon polyp - NO COLONIC TISSUE IDENTIFIED, SEE COMMENT. Microscopic Comment There is a minute fragment of biopsy material present in the specimen container/formalin solution. Following routine specimen processing, there is no colonic tissue  identified. (CR:kh 02-26-12)  Colonoscopy 05/22/21: - The examined portion of the ileum was normal. - Diverticulosis in the entire examined colon. - Two 3 to 5 mm polyps in the ascending colon and in the cecum, removed with a cold snare. Resected and retrieved. - Two 1 to 2 mm polyps in the ascending colon, removed with a cold biopsy forceps. Resected and retrieved. - Non-bleeding internal hemorrhoids. Path: Surgical [P], colon, ascending and cecum, polyp (4) TUBULAR ADENOMAS NEGATIVE FOR HIGH-GRADE DYSPLASIA AND CARCINOMA  EGD 01/12/22:  Path: Surgical [P], esophagus REACTIVE SQUAMOUS MUCOSA NEGATIVE FOR GLANDULAR EPITHELIUM, ACUTE INFLAMMATION, EOSINOPHILS, DYSPLASIA AND CARCINOMA FUNGAL ORGANISMS NOT IDENTIFIED  ASSESSMENT AND PLAN: Melena Lower abdominal pain Constipation Fatty liver History of colon polyps Patient presents with intermittent lower abdominal pain, which may be related to IBS-C. Linzess has not been effective so will go ahead and have him stop this medication. He can continued to use Ex-Lax and can try prunes to help with his constipation. Bentyl has been effective for helping his ab pain. He is still seeing black stools intermittently, which may be due to Pepto Bismol use. His recent EGD showed some mild esophagitis, which is likely due to reflux. Overall patient seems to be doing well. - Bentyl PRN for ab pain - Will remove sucralfate from his medication list (developed a rash after taking this) - Try prunes for constipation - Continue to use Ex-Lax PRN - Stop Linzess 145 mcg QD since this has been ineffective - Next colonoscopy due in 05/2024 - RTC 6 months   I spent 30 minutes of time, including in depth chart review, independent review of results as outlined above, communicating results with the patient directly, face-to-face time with the patient, coordinating care, and ordering studies and medications as appropriate, and documentation.

## 2022-05-02 ENCOUNTER — Other Ambulatory Visit: Payer: Self-pay | Admitting: Student

## 2022-05-02 DIAGNOSIS — R109 Unspecified abdominal pain: Secondary | ICD-10-CM

## 2022-05-02 DIAGNOSIS — M545 Low back pain, unspecified: Secondary | ICD-10-CM

## 2022-05-07 ENCOUNTER — Telehealth: Payer: Self-pay | Admitting: Internal Medicine

## 2022-05-07 NOTE — Telephone Encounter (Signed)
Patient reports a constant pain in his lower abdomen that started 3 days ago. He took a laxative yesterday to create a bowel movement. He does not  consider himself constipated. He tells me he does not have any fever. He has an appointment 05/09/22. Advised go to the ER if his pain is severe and unbearable.

## 2022-05-07 NOTE — Telephone Encounter (Signed)
Patient called.  He has an appointment on 4/17 at 10:10 a.m.; however, he is in unbearable pain and wishes to know what he can do between now and then to help alleviate the pain.  He was informed there were no sooner appointments either with Dr. Leonides Schanz or the APPs.  Please call patient and advise.  Thank you.

## 2022-05-09 ENCOUNTER — Encounter: Payer: Self-pay | Admitting: Internal Medicine

## 2022-05-09 ENCOUNTER — Ambulatory Visit: Payer: Medicare Other | Admitting: Internal Medicine

## 2022-05-09 ENCOUNTER — Other Ambulatory Visit (INDEPENDENT_AMBULATORY_CARE_PROVIDER_SITE_OTHER): Payer: Medicare Other

## 2022-05-09 ENCOUNTER — Ambulatory Visit (HOSPITAL_COMMUNITY)
Admission: RE | Admit: 2022-05-09 | Discharge: 2022-05-09 | Disposition: A | Discharge status: Home / Self Care | Payer: Medicare Other | Source: Ambulatory Visit | Attending: Internal Medicine | Admitting: Internal Medicine

## 2022-05-09 VITALS — BP 128/62 | HR 76 | Ht 66.5 in | Wt 137.4 lb

## 2022-05-09 DIAGNOSIS — K5900 Constipation, unspecified: Secondary | ICD-10-CM

## 2022-05-09 DIAGNOSIS — R1030 Lower abdominal pain, unspecified: Secondary | ICD-10-CM | POA: Diagnosis present

## 2022-05-09 DIAGNOSIS — M545 Low back pain, unspecified: Secondary | ICD-10-CM | POA: Insufficient documentation

## 2022-05-09 LAB — COMPREHENSIVE METABOLIC PANEL
ALT: 35 U/L (ref 0–53)
AST: 25 U/L (ref 0–37)
Albumin: 4.2 g/dL (ref 3.5–5.2)
Alkaline Phosphatase: 80 U/L (ref 39–117)
BUN: 12 mg/dL (ref 6–23)
CO2: 27 mEq/L (ref 19–32)
Calcium: 9.1 mg/dL (ref 8.4–10.5)
Chloride: 101 mEq/L (ref 96–112)
Creatinine, Ser: 1 mg/dL (ref 0.40–1.50)
GFR: 74.27 mL/min (ref >60.00)
Glucose, Bld: 150 mg/dL — ABNORMAL HIGH (ref 70–99)
Potassium: 4.1 mEq/L (ref 3.5–5.1)
Sodium: 135 mEq/L (ref 135–145)
Total Bilirubin: 0.8 mg/dL (ref 0.2–1.2)
Total Protein: 7.1 g/dL (ref 6.0–8.3)

## 2022-05-09 LAB — CBC WITH DIFFERENTIAL/PLATELET
Basophils Absolute: 0 10*3/uL (ref 0.0–0.1)
Basophils Relative: 0.8 % (ref 0.0–3.0)
Eosinophils Absolute: 0 10*3/uL (ref 0.0–0.7)
Eosinophils Relative: 0.6 % (ref 0.0–5.0)
HCT: 45.2 % (ref 39.0–52.0)
Hemoglobin: 15.5 g/dL (ref 13.0–17.0)
Lymphocytes Relative: 33.7 % (ref 12.0–46.0)
Lymphs Abs: 1.9 10*3/uL (ref 0.7–4.0)
MCHC: 34.2 g/dL (ref 30.0–36.0)
MCV: 94 fl (ref 78.0–100.0)
Monocytes Absolute: 0.4 10*3/uL (ref 0.1–1.0)
Monocytes Relative: 8 % (ref 3.0–12.0)
Neutro Abs: 3.2 10*3/uL (ref 1.4–7.7)
Neutrophils Relative %: 56.9 % (ref 43.0–77.0)
Platelets: 227 10*3/uL (ref 150.0–400.0)
RBC: 4.81 Mil/uL (ref 4.22–5.81)
RDW: 14.2 % (ref 11.5–15.5)
WBC: 5.6 10*3/uL (ref 4.0–10.5)

## 2022-05-09 LAB — HIGH SENSITIVITY CRP: CRP, High Sensitivity: 1.39 mg/L (ref 0.000–5.000)

## 2022-05-09 LAB — LIPASE: Lipase: 17 U/L (ref 11.0–59.0)

## 2022-05-09 LAB — TSH: TSH: 1.26 u[IU]/mL (ref 0.35–5.50)

## 2022-05-09 MED ORDER — DICYCLOMINE HCL 10 MG PO CAPS: 10.0000 mg | ORAL_CAPSULE | Freq: Four times a day (QID) | ORAL | 2 refills | Status: DC | PRN

## 2022-05-09 NOTE — Progress Notes (Signed)
05/09/2022 [name] [id number] 08/01/1950   CHIEF COMPLAINT: Lower abdominal pain   HISTORY OF PRESENT ILLNESS: [name] is a 72 year old male with a past medical history of depression, hypertension, DM II, TIA, atypical chest pain with normal cardiac cath in 2005 and normal nuclear stress test 2012, 2016 and 02/2017, prostate cancer s/p prostatectomy 2006, fatty liver, GERD, diverticulosis and colon polyps presents for follow up of lower abdominal pain  Interval History: He has been having lower abdominal pain that travels from the front to the back. Nothing seems to make it better. Certain positions can make the pain worse. The pain is constant and is hard to describe. The pain has been going on for the last 3 weeks. He has never had pain like this in the past. His bowel habits have not been regular. He has been using Ex-lax (senna). He has not tried the prunes yet but plans to do so soon. He is having on average one BM 3-4 times per week. Linzess didn't really help. Denies N&V. He is still eating and drinking fine. Weight has been stable. Denies fevers. Denies dysuria. He gave notice at work.  Wt Readings from Last 3 Encounters:  05/09/22 137 lb 6 oz (62.3 kg)  02/14/22 138 lb (62.6 kg)  01/12/22 139 lb (63 kg)     Outpatient Encounter Medications as of 05/09/2022  Medication Sig   amLODipine (NORVASC) 5 MG tablet Take 1 tablet (5 mg total) by mouth daily.   dicyclomine (BENTYL) 10 MG capsule Take 1 capsule (10 mg total) by mouth 4 (four) times daily as needed for spasms.   glipiZIDE (GLUCOTROL) 5 MG tablet Take 5 mg by mouth 2 (two) times daily before a meal.   glipiZIDE-metformin (METAGLIP) 5-500 MG tablet Take 1 tablet by mouth 2 (two) times daily.   losartan-hydrochlorothiazide (HYZAAR) 50-12.5 MG tablet Take 1 tablet by mouth daily.   MOUNJARO 2.5 MG/0.5ML Pen    pantoprazole (PROTONIX) 40 MG tablet Take 40 mg by mouth 2 (two) times daily.   pravastatin (PRAVACHOL)  20 MG tablet Take 1 tablet (20 mg total) by mouth every evening.   Sennosides (EX-LAX PO) Take 1-2 tablets by mouth as needed.   [DISCONTINUED] [facility] SENNA PLUS 8.6-50 MG tablet Take 2 tablets by mouth at bedtime.   No facility-administered encounter medications on file as of 05/09/2022.   PHYSICAL EXAM: BP (!) 140/70 (BP Location: Left Arm, Patient Position: Sitting, Cuff Size: Normal)   Pulse 76   Ht 5' 6.5" (1.689 m)   Wt 137 lb 6 oz (62.3 kg)   BMI 21.84 kg/m  General: Alert. NAD Head: Normocephalic and atraumatic.  Lungs: normal effort Heart: Regular rate Abdomen: Soft, tender in the lower abdomen, non-distended Skin: Warm and dry. No rash or lesions on visible extremities. Extremities: No edema. Neurological: Alert oriented x 4, no focal deficits.  Psychological:  Alert and cooperative. Normal mood and affect.  Labs 04/2021: CBC, CMP, and CRP unremarkable  Labs 07/2021: ALT is mildly elevated at 52.  Labs 08/2021: ANA positive at 1:40. Iron studies with mildly elevated ferritin. IgG nml. ASMA negative. Hep A Ab NR. Hep B surface antibody NR. Hep B surface antigen NR.   Labs 12/2021: CBC nml. Iron studies normal.   CT A/P w/contrast 11/25/20: IMPRESSION: 1. No acute findings in the abdomen. 2. Diffuse hepatic steatosis. 3. Descending colonic diverticulosis without findings of acute diverticulitis. 4. Aortic Atherosclerosis (ICD10-I70.0).  CT A/P w/contrast  05/08/21: IMPRESSION: 1. No acute abnormality of the abdomen or pelvis. 2. Left-sided colonic diverticulosis without findings of acute diverticulitis. 3. Hepatic steatosis. 4. Aortic Atherosclerosis (ICD10-I70.0).  KUB 10/31/21: IMPRESSION: Moderate fecal loading in the colon, particularly the ascending colon. No other abnormalities.  Colonoscopy 02/21/12: External and internal hemorrhoids. Diverticulosis in the sigmoid colon, distal descending colon, and cecum. Path: Surgical [P], transverse colon polyp - NO  COLONIC TISSUE IDENTIFIED, SEE COMMENT. Microscopic Comment There is a minute fragment of biopsy material present in the specimen container/formalin solution. Following routine specimen processing, there is no colonic tissue identified. (CR:kh 02-26-12)  Colonoscopy 05/22/21: - The examined portion of the ileum was normal. - Diverticulosis in the entire examined colon. - Two 3 to 5 mm polyps in the ascending colon and in the cecum, removed with a cold snare. Resected and retrieved. - Two 1 to 2 mm polyps in the ascending colon, removed with a cold biopsy forceps. Resected and retrieved. - Non-bleeding internal hemorrhoids. Path: Surgical [P], colon, ascending and cecum, polyp (4) TUBULAR ADENOMAS NEGATIVE FOR HIGH-GRADE DYSPLASIA AND CARCINOMA  EGD 01/12/22:  Path: Surgical [P], esophagus REACTIVE SQUAMOUS MUCOSA NEGATIVE FOR GLANDULAR EPITHELIUM, ACUTE INFLAMMATION, EOSINOPHILS, DYSPLASIA AND CARCINOMA FUNGAL ORGANISMS NOT IDENTIFIED  ASSESSMENT AND PLAN: Lower abdominal pain Constipation Low back pain Fatty liver GERD History of colon polyps Patient presents with worsening of persistent lower abdominal pain that feels different from what he has experienced in the past. Will check labs and a CT A/P to assess for a source of his abdominal pain. Since patient's ab pain could also be related to some low back pain, will perform lumbar spine CT at the same time. He does still have issues with constipation and uses Ex-lax currently to help induce BMs. He does plan to try prunes in the future as well. To try to help with controlling his ab pain, I did ask him to use Bentyl PRN.  - Check CBC, CMP, lipase, CRP, TSH - Try prunes for constipation - Continue to use Ex-Lax PRN - Restart Bentyl 10 mg TID PRN for ab pain - Check CT A/P w/o contrast and lumbar spine STAT - Next colonoscopy due in 05/2024 for polyp surveillance - RTC 2 months  I spent 40 minutes of time, including in depth chart  review, independent review of results as outlined above, communicating results with the patient directly, face-to-face time with the patient, coordinating care, and ordering studies and medications as appropriate, and documentation.

## 2022-05-09 NOTE — Patient Instructions (Signed)
Your provider has requested that you go to the basement level for lab work before leaving today. Press "B" on the elevator. The lab is located at the first door on the left as you exit the elevator.   You have been scheduled for a CT scan of the abdomen and pelvis  and lumbar spine at [facility], 1st floor Radiology. You are scheduled on 05/09/22 at [facility]. You should arrive 15 minutes prior to your appointment time for registration.  We are giving you 2 bottles of contrast today that you will need to drink before arriving for the exam.    medications: METFORMIN, GLUCOPHAGE, GLUCOVANCE, AVANDAMET, RIOMET, FORTAMET, ACTOPLUS MET, JANUMET, GLUMETZA or METAGLIP, you MAY be asked to HOLD this medication 48 hours AFTER the exam.   The purpose of you drinking the oral contrast is to aid in the visualization of your intestinal tract. The contrast solution may cause some diarrhea. Depending on your individual set of symptoms, you may also receive an intravenous injection of x-ray contrast/dye. Plan on being at [facility] for 45 minutes or longer, depending on the type of exam you are having performed.   If you have any questions regarding your exam or if you need to reschedule, you may call [facility] at [phone] between the hours of 8:00 am and 5:00 pm, Monday-Friday.    We have sent the following medications to your pharmacy for you to pick up at your convenience: Bentyl   You are schedule for a follow up visit on 08/08/22 at 9:50 am  If your blood pressure at your visit was 140/90 or greater, please contact your primary care physician to follow up on this.  _______________________________________________________  If you are age 91 or older, your body mass index should be between 23-30. Your Body mass index is 21.84 kg/m. If this is out of the aforementioned range listed, please consider follow up with your Primary Care Provider.  If you are age 61 or younger, your body mass  index should be between 19-25. Your Body mass index is 21.84 kg/m. If this is out of the aformentioned range listed, please consider follow up with your Primary Care Provider.   ________________________________________________________  The [facility] providers would like to encourage you to use [facility] to communicate with providers for non-urgent requests or questions.  Due to long hold times on the telephone, sending your provider a message by [facility] may be a faster and more efficient way to get a response.  Please allow 48 business hours for a response.  Please remember that this is for non-urgent requests.  _______________________________________________________    Thank you for entrusting me with your care and for choosing [facility], Dr. Eulah Pont

## 2022-05-11 NOTE — Progress Notes (Signed)
Hi Ms. [name], we have a mutual patient [name] who I just saw earlier this week for some ab pain and low back pain. I was getting a CT A/P so I obtained a CT of his lumbar spine at the same time that shows some degenerative disc disease, which I was hoping that you could address with him in the near future. His DDD could be an explanation for his low back pain.

## 2022-05-13 ENCOUNTER — Encounter: Payer: Self-pay | Admitting: Internal Medicine

## 2022-08-08 ENCOUNTER — Encounter: Payer: Self-pay | Admitting: Physical Medicine and Rehabilitation

## 2022-08-08 ENCOUNTER — Ambulatory Visit: Payer: Medicare Other | Admitting: Internal Medicine

## 2022-08-08 ENCOUNTER — Ambulatory Visit (INDEPENDENT_AMBULATORY_CARE_PROVIDER_SITE_OTHER): Payer: Medicare Other | Admitting: Physical Medicine and Rehabilitation

## 2022-08-08 DIAGNOSIS — G8929 Other chronic pain: Secondary | ICD-10-CM | POA: Diagnosis not present

## 2022-08-08 DIAGNOSIS — R1030 Lower abdominal pain, unspecified: Secondary | ICD-10-CM

## 2022-08-08 DIAGNOSIS — M545 Low back pain, unspecified: Secondary | ICD-10-CM

## 2022-08-08 DIAGNOSIS — K5904 Chronic idiopathic constipation: Secondary | ICD-10-CM

## 2022-08-08 NOTE — Progress Notes (Signed)
Lower back pain/stomach pain  Has been seeing Dr. Jacinto Reap over at [facility]  Is taking ibuprofen and methocarbomal for this  Pain is intermittent  Has had CT lumbar on 05/09/22

## 2022-08-08 NOTE — Progress Notes (Unsigned)
[name] - 72 y.o. male MRN [id number]  Date of birth: [birth year]  Office Visit Note: Visit Date: 08/08/2022 PCP: Justin Aldo, NP Referred by: Justin Aldo, NP  Subjective: Chief Complaint  Patient presents with   Lower Back - Pain   HPI: [name] is a 72 y.o. male who comes in today per the request of Justin Aldo, NP for evaluation of chronic, worsening and severe lower back pain radiating around to front of abdomen. Patient is accompanied by his daughter today. Pain ongoing for several months. No aggravating factors. States his pain is constant. He describes pain as sore and aching, currently rates as 3 out of 10. Some relief of pain with home exercise regimen, rest and use of medications. Minimal relief with Ibuprofen, Robaxin and Bentyl. Recent CT of lumbar spine exhibits mild left subarticular narrowing at L4-L5 secondary to a leftward disc protrusion and mild facet hypertrophy. There is also mild foraminal narrowing at L5-S1 bilaterally. Patient very active, states he walks frequency and also rides bike multiple times a day. No history of lumbar surgery/injections. Patient denies focal weakness, numbness and tingling. No recent trauma or falls.   Of note, he was recently evaluated by Dr. Norwood Wolf with GI and treated for chronic constipation, recent CT of abdomen/pelvis negative for any concerning findings. Per review of his chart, patients daughter recently requested neurology evaluation for memory issues. Referral was placed to neurology, however when patient was called to be scheduled he declined appointment.    Review of Systems  Gastrointestinal:  Positive for abdominal pain.  Musculoskeletal:  Positive for back pain.  Neurological:  Negative for tingling, sensory change, focal weakness and weakness.  All other systems reviewed and are negative.  Otherwise per HPI.  Assessment & Plan: Visit Diagnoses:    ICD-10-CM   1. Chronic bilateral low back pain without  sciatica  M54.50 MR LUMBAR SPINE WO CONTRAST   G89.29     2. Lower abdominal pain  R10.30 MR LUMBAR SPINE WO CONTRAST    3. Chronic idiopathic constipation  K59.04 MR LUMBAR SPINE WO CONTRAST       Plan: Findings:  Chronic, worsening and severe bilateral lower back pain radiating around to abdomen. Pain ongoing for several months. Some relief of pain with home exercise regimen, rest and use of medications. Patients clinical presentation and exam are complex, his symptoms do not correlate with specific spine issue. Recent CT of lumbar spine shows mild lateral recess, foraminal stenosis and facet arthropathy. This could be more of an isolated gastrointestinal issue. Next step is to place order for lumbar MRI imaging. I also feel he would benefit from short course of formal physical therapy, however patient does not feel PT would be beneficial. I will have patient follow up for lumbar MRI review. No red flag symptoms noted upon exam today.     Meds & Orders: No orders of the defined types were placed in this encounter.   Orders Placed This Encounter  Procedures   MR LUMBAR SPINE WO CONTRAST    Follow-up: Return for lumbar MRI review.   Procedures: No procedures performed      Clinical History: No specialty comments available.   He reports that he has never smoked. He has never used smokeless tobacco. No results for input(s): "HGBA1C", "LABURIC" in the last 8760 hours.  Objective:  VS:  HT:    WT:   BMI:     BP:   HR: bpm  TEMP: ( )  RESP:  Physical Exam Vitals and nursing note reviewed.  HENT:     Head: Normocephalic and atraumatic.     Right Ear: External ear normal.     Left Ear: External ear normal.     Nose: Nose normal.     Mouth/Throat:     Mouth: Mucous membranes are moist.  Eyes:     Extraocular Movements: Extraocular movements intact.  Cardiovascular:     Rate and Rhythm: Normal rate.     Pulses: Normal pulses.  Pulmonary:     Effort: Pulmonary effort is  normal.  Abdominal:     General: Abdomen is flat. There is no distension.  Musculoskeletal:        General: Tenderness present.     Cervical back: Normal range of motion.     Comments: Patient rises from seated position to standing without difficulty. Good lumbar range of motion. No pain noted with facet loading. 5/5 strength noted with bilateral hip flexion, knee flexion/extension, ankle dorsiflexion/plantarflexion and EHL. No clonus noted bilaterally. No pain upon palpation of greater trochanters. No pain with internal/external rotation of bilateral hips. Sensation intact bilaterally. Negative slump test bilaterally. Ambulates without aid, gait steady.     Skin:    General: Skin is warm and dry.     Capillary Refill: Capillary refill takes less than 2 seconds.  Neurological:     General: No focal deficit present.     Mental Status: He is alert and oriented to person, place, and time.  Psychiatric:        Mood and Affect: Mood normal.        Behavior: Behavior normal.     Ortho Exam  Imaging: No results found.  Past Medical/Family/Surgical/Social History: Medications & Allergies reviewed per EMR, new medications updated. Patient Active Problem List   Diagnosis Date Noted   DDD (degenerative disc disease), cervical 11/12/2019   Cervical radiculopathy 11/12/2019   Left facial numbness    Left sided numbness 02/01/2014   TIA (transient ischemic attack) 02/01/2014   Left-sided weakness    Pain in the chest    Lower abdominal pain 01/31/2014   Unintentional weight loss 01/31/2014   Chest pain 01/30/2014   Headache 03/26/2013   Essential hypertension    Chest pain 01/13/2011   Diabetes mellitus type 2, noninsulin dependent (HCC) 01/13/2011   GERD (gastroesophageal reflux disease) 01/13/2011   Hypertensive heart disease without CHF    Hyperlipidemia    Depression    History of prostate cancer 08/22/2004   Past Medical History:  Diagnosis Date   Depression    Diabetes  mellitus type 2, noninsulin dependent (HCC) 01/13/2011   Diverticulosis    Esophageal spasm    Essential hypertension    Fatty liver    GERD (gastroesophageal reflux disease) 01/13/2011   History of cardiac cath    a. 2005: normal cors, normal EF. b. Nuc 12/2010: normal, EF 61%. 2D Echo 12/2010: EF normal (% not given), no RWMA, trivial AI, aortic root trivially dilated, mild MR.   Hyperlipidemia    Prostate cancer (HCC) 2006   Gleason 7 treated with radical prostatectomy   Seasonal allergies    Wears glasses    Family History  Problem Relation Age of Onset   Cancer Mother    Colon cancer Father    COPD Sister    Hypertension Sister    Stroke Brother        [place] and hit head, ? stroke   CAD Brother  Bypass in his 37's.   Stomach cancer Neg Hx    Esophageal cancer Neg Hx    Colon polyps Neg Hx    Past Surgical History:  Procedure Laterality Date   CARDIAC CATHETERIZATION  2005   No CAD   CIRCUMCISION N/A 07/23/2018   Procedure: CIRCUMCISION ADULT;  Surgeon: Crista Elliot, MD;  Location: [facility];  Service: Urology;  Laterality: N/A;   COLONOSCOPY     PROSTATECTOMY     Social History   Occupational History   Occupation: BUS DRIVER    Employer: [employer]  Tobacco Use   Smoking status: Never   Smokeless tobacco: Never  Vaping Use   Vaping status: Never Used  Substance and Sexual Activity   Alcohol use: No   Drug use: No   Sexual activity: Not on file

## 2022-08-13 ENCOUNTER — Ambulatory Visit: Payer: Medicare Other

## 2022-08-13 ENCOUNTER — Ambulatory Visit: Payer: Medicare Other | Admitting: Physician Assistant

## 2022-08-13 ENCOUNTER — Other Ambulatory Visit (INDEPENDENT_AMBULATORY_CARE_PROVIDER_SITE_OTHER): Payer: Medicare Other

## 2022-08-13 ENCOUNTER — Encounter: Payer: Self-pay | Admitting: Physician Assistant

## 2022-08-13 VITALS — BP 147/73 | HR 71 | Resp 18 | Ht 66.5 in | Wt 134.0 lb

## 2022-08-13 DIAGNOSIS — R413 Other amnesia: Secondary | ICD-10-CM

## 2022-08-13 LAB — VITAMIN B12: Vitamin B-12: 342 pg/mL (ref 211–911)

## 2022-08-13 LAB — TSH: TSH: 1.29 u[IU]/mL (ref 0.35–5.50)

## 2022-08-13 NOTE — Progress Notes (Signed)
vitamin B12 is low.  Recommend starting on vitamin B12 1000 mcg daily. Thyroid is normal. Follow-up with PCP.  Thank you

## 2022-08-13 NOTE — Patient Instructions (Addendum)
It was a pleasure to see you today at our office.   Recommendations:  Neurocognitive evaluation at our office   MRI of the brain, the radiology office will call you to arrange you appointment   Check labs today   Will likely start a memory pill soon, after the results of the MRI return Follow up in 3 months   For psychiatric meds, mood meds: Please have your primary care physician manage these medications.  If you have any severe symptoms of a stroke, or other severe issues such as confusion,severe chills or fever, etc call 911 or go to the ER as you may need to be evaluated further  For guidance regarding [facility] and if placement were needed at the facility, contact Social Worker tel: [phone]  For assessment of decision of mental capacity and competency:  Call Dr. Erick Blinks, geriatric psychiatrist at [phone]  Counseling regarding caregiver distress, including caregiver depression, anxiety and issues regarding community resources, adult day care programs, adult living facilities, or memory care questions:  please contact your  Primary Doctor's Social Worker   Whom to call: Memory  decline, memory medications: Call our office [phone]    [email or web address]   RECOMMENDATIONS FOR ALL PATIENTS WITH MEMORY PROBLEMS: 1. Continue to exercise (Recommend 30 minutes of walking everyday, or 3 hours every week) 2. Increase social interactions - continue going to [place] and enjoy social gatherings with friends and family 3. Eat healthy, avoid fried foods and eat more fruits and vegetables 4. Maintain adequate blood pressure, blood sugar, and blood cholesterol level. Reducing the risk of stroke and cardiovascular disease also helps promoting better memory. 5. Avoid stressful situations. Live a simple life and avoid aggravations. Organize your time and prepare for the next day in anticipation. 6. Sleep  well, avoid any interruptions of sleep and avoid any distractions in the bedroom that may interfere with adequate sleep quality 7. Avoid sugar, avoid sweets as there is a strong link between excessive sugar intake, diabetes, and cognitive impairment We discussed the Mediterranean diet, which has been shown to help patients reduce the risk of progressive memory disorders and reduces cardiovascular risk. This includes eating fish, eat fruits and green leafy vegetables, nuts like almonds and hazelnuts, walnuts, and also use olive oil. Avoid fast foods and fried foods as much as possible. Avoid sweets and sugar as sugar use has been linked to worsening of memory function.  There is always a concern of gradual progression of memory problems. If this is the case, then we may need to adjust level of care according to patient needs. Support, both to the patient and caregiver, should then be put into place.      You have been referred for a neuropsychological evaluation (i.e., evaluation of memory and thinking abilities). Please bring someone with you to this appointment if possible, as it is helpful for the doctor to hear from both you and another adult who knows you well. Please bring eyeglasses and hearing aids if you wear them.    The evaluation will take approximately 3 hours and has two parts:   The first part is a clinical interview with the neuropsychologist (Dr. Milbert Coulter or Dr. Roseanne Reno). During the interview, the neuropsychologist will speak with you and the individual you brought to the appointment.    The second part of the evaluation is testing with the doctor's technician Annabelle Harman or Selena Batten). During the testing, the technician will ask you to remember different types of material, solve  problems, and answer some questionnaires. Your family member will not be present for this portion of the evaluation.   Please note: We must reserve several hours of the neuropsychologist's time and the psychometrician's  time for your evaluation appointment. As such, there is a No-Show fee of $100. If you are unable to attend any of your appointments, please contact our office as soon as possible to reschedule.      DRIVING: Regarding driving, in patients with progressive memory problems, driving will be impaired. We advise to have someone else do the driving if trouble finding directions or if minor accidents are reported. Independent driving assessment is available to determine safety of driving.   If you are interested in the driving assessment, you can contact the following:  [facility] [employer] [facility] in [place] [phone]  [facility] [phone]  [facility] [phone]  [facility] [phone] or [phone]   FALL PRECAUTIONS: Be cautious when walking. Scan the area for obstacles that may increase the risk of trips and falls. When getting up in the mornings, sit up at the edge of the bed for a few minutes before getting out of bed. Consider elevating the bed at the head end to avoid drop of blood pressure when getting up. Walk always in a well-lit room (use night lights in the walls). Avoid area rugs or power cords from appliances in the middle of the walkways. Use a walker or a cane if necessary and consider physical therapy for balance exercise. Get your eyesight checked regularly.  FINANCIAL OVERSIGHT: Supervision, especially oversight when making financial decisions or transactions is also recommended.  HOME SAFETY: Consider the safety of the kitchen when operating appliances like stoves, microwave oven, and blender. Consider having supervision and share cooking responsibilities until no longer able to participate in those. Accidents with firearms and other hazards in the house should be identified and addressed as well.   ABILITY TO BE LEFT ALONE: If patient is unable to contact 911 operator, consider using LifeLine, or when the need is there, arrange  for someone to stay with patients. Smoking is a fire hazard, consider supervision or cessation. Risk of wandering should be assessed by caregiver and if detected at any point, supervision and safe proof recommendations should be instituted.  MEDICATION SUPERVISION: Inability to self-administer medication needs to be constantly addressed. Implement a mechanism to ensure safe administration of the medications.      Mediterranean Diet A Mediterranean diet refers to food and lifestyle choices that are based on the traditions of countries located on the [place]. This way of eating has been shown to help prevent certain conditions and improve outcomes for people who have chronic diseases, like kidney disease and heart disease. What are tips for following this plan? Lifestyle  Cook and eat meals together with your family, when possible. Drink enough fluid to keep your urine clear or pale yellow. Be physically active every day. This includes: Aerobic exercise like running or swimming. Leisure activities like gardening, walking, or housework. Get 7-8 hours of sleep each night. If recommended by your health care provider, drink red wine in moderation. This means 1 glass a day for nonpregnant women and 2 glasses a day for men. A glass of wine equals 5 oz (150 mL). Reading food labels  Check the serving size of packaged foods. For foods such as rice and pasta, the serving size refers to the amount of cooked product, not dry. Check the total fat in packaged foods. Avoid  foods that have saturated fat or trans fats. Check the ingredients list for added sugars, such as corn syrup. Shopping  At the grocery store, buy most of your food from the areas near the walls of the store. This includes: Fresh fruits and vegetables (produce). Grains, beans, nuts, and seeds. Some of these may be available in unpackaged forms or large amounts (in bulk). Fresh seafood. Poultry and eggs. Low-fat dairy  products. Buy whole ingredients instead of prepackaged foods. Buy fresh fruits and vegetables in-season from local farmers markets. Buy frozen fruits and vegetables in resealable bags. If you do not have access to quality fresh seafood, buy precooked frozen shrimp or canned fish, such as tuna, salmon, or sardines. Buy small amounts of raw or cooked vegetables, salads, or olives from the deli or salad bar at your store. Stock your pantry so you always have certain foods on hand, such as olive oil, canned tuna, canned tomatoes, rice, pasta, and beans. Cooking  Cook foods with extra-virgin olive oil instead of using butter or other vegetable oils. Have meat as a side dish, and have vegetables or grains as your main dish. This means having meat in small portions or adding small amounts of meat to foods like pasta or stew. Use beans or vegetables instead of meat in common dishes like chili or lasagna. Experiment with different cooking methods. Try roasting or broiling vegetables instead of steaming or sauteing them. Add frozen vegetables to soups, stews, pasta, or rice. Add nuts or seeds for added healthy fat at each meal. You can add these to yogurt, salads, or vegetable dishes. Marinate fish or vegetables using olive oil, lemon juice, garlic, and fresh herbs. Meal planning  Plan to eat 1 vegetarian meal one day each week. Try to work up to 2 vegetarian meals, if possible. Eat seafood 2 or more times a week. Have healthy snacks readily available, such as: Vegetable sticks with hummus. Greek yogurt. Fruit and nut trail mix. Eat balanced meals throughout the week. This includes: Fruit: 2-3 servings a day Vegetables: 4-5 servings a day Low-fat dairy: 2 servings a day Fish, poultry, or lean meat: 1 serving a day Beans and legumes: 2 or more servings a week Nuts and seeds: 1-2 servings a day Whole grains: 6-8 servings a day Extra-virgin olive oil: 3-4 servings a day Limit red meat and sweets  to only a few servings a month What are my food choices? Mediterranean diet Recommended Grains: Whole-grain pasta. Brown rice. Bulgar wheat. Polenta. Couscous. Whole-wheat bread. Orpah Cobb. Vegetables: Artichokes. Beets. Broccoli. Cabbage. Carrots. Eggplant. Green beans. Chard. Kale. Spinach. Onions. Leeks. Peas. Squash. Tomatoes. Peppers. Radishes. Fruits: Apples. Apricots. Avocado. Berries. Bananas. Cherries. Dates. Figs. Grapes. Lemons. Melon. Oranges. Peaches. Plums. Pomegranate. Meats and other protein foods: Beans. Almonds. Sunflower seeds. Pine nuts. Peanuts. Cod. Salmon. Scallops. Shrimp. Tuna. Tilapia. Clams. Oysters. Eggs. Dairy: Low-fat milk. Cheese. Greek yogurt. Beverages: Water. Red wine. Herbal tea. Fats and oils: Extra virgin olive oil. Avocado oil. Grape seed oil. Sweets and desserts: [place] yogurt with honey. Baked apples. Poached pears. Trail mix. Seasoning and other foods: Basil. Cilantro. Coriander. Cumin. Mint. Parsley. Sage. Rosemary. Tarragon. Garlic. Oregano. Thyme. Pepper. Balsalmic vinegar. Tahini. Hummus. Tomato sauce. Olives. Mushrooms. Limit these Grains: Prepackaged pasta or rice dishes. Prepackaged cereal with added sugar. Vegetables: Deep fried potatoes (french fries). Fruits: Fruit canned in syrup. Meats and other protein foods: Beef. Pork. Lamb. Poultry with skin. Hot dogs. Tomasa Blase. Dairy: Ice cream. Sour cream. Whole milk. Beverages: Juice. Sugar-sweetened soft  drinks. Beer. Liquor and spirits. Fats and oils: Butter. Canola oil. Vegetable oil. Beef fat (tallow). Lard. Sweets and desserts: Cookies. Cakes. Pies. Candy. Seasoning and other foods: Mayonnaise. Premade sauces and marinades. The items listed may not be a complete list. Talk with your dietitian about what dietary choices are right for you. Summary The Mediterranean diet includes both food and lifestyle choices. Eat a variety of fresh fruits and vegetables, beans, nuts, seeds, and whole  grains. Limit the amount of red meat and sweets that you eat. Talk with your health care provider about whether it is safe for you to drink red wine in moderation. This means 1 glass a day for nonpregnant women and 2 glasses a day for men. A glass of wine equals 5 oz (150 mL). This information is not intended to replace advice given to you by your health care provider. Make sure you discuss any questions you have with your health care provider. Document Released: 09/01/2015 Document Revised: 10/04/2015 Document Reviewed: 09/01/2015 Elsevier Interactive Patient Education  2017 [employer].   [facility] [phone] Labs today [address]

## 2022-08-13 NOTE — Progress Notes (Signed)
Assessment/Plan:    The patient is seen in neurologic consultation at the request of Justin Aldo, NP for the evaluation of memory.  [name] is a very pleasant 72 y.o. 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 [name] today is 13 /30. Findings are suspicious for dementia likely due to Alzheimer's Disease. Further workup is in progress. Discussed with his family starting [facility]. Family is inclined to wait till the results of the MRI prior to initiating this agent. Patient is  still able to participate on some IADLs.    Dementia likely due to Alzheimer's Disease    MRI brain without contrast to assess for underlying structural abnormality and assess vascular load  Neuropsych evaluation for clarity of the diagnosis  Will consider starting ACHI pending on the MRI brain results.  Check B12, TSH Monitor driving, recommend copilot versus driving test  Recommend good control of cardiovascular risk factors.   Continue to control mood as per PCP Folllow up in 3 months   Subjective:    The patient is accompanied by his family who supplement the history.    How long did patient have memory difficulties? For the last 2 years Patient has difficulty remembering recent conversations but able to remember people's names. There has been more confabulation in his stories "He thinks differently that we see things". Likes to read Bible, do yard work.  repeats oneself?   "Over and over" for the last 2 years Disoriented when walking into a room?  Denies  Leaving objects in unusual places?   Not in unusual places although recently he lost money and eventually found it .  Wandering behavior?  Denies  Any personality changes? Endorsed, for the last 2 years  Any history of depression?:  Denies.   Hallucinations or paranoia? He has some olfactory hallucinations, "the water has an odor to it". "The car smells of burning". Daughter endorses paranoia about his car , has been to several mechanics who told him than there is nothing wrong with the car  so  he is going to call [employer]"-daughter says  Seizures? denies    Any sleep changes?  Denies  dreams, REM behavior or sleepwalking   Sleep apnea? denies   Any hygiene concerns?  denies   Independent of bathing and dressing?  Endorsed  Does the patient need help with medications? Patient is in charge , daughter unsure if he is compliant to the meds. "He forgot to take his BP med today" Who is in charge of the finances?  Daughter is in charge, the bills are on auto pay      Any changes in appetite? "Not as it was before"-daughter says.   Patient have trouble swallowing?  denies   Does the patient cook? Yes , denies kitchen accidents.   Any headaches?  On top of my head sometimes.   Chronic back pain?  denies   Ambulates with difficulty? Denies, "he walks a lot"     Recent falls or head injuries? denies     Vision changes?  His acuity is not as good. He has an appt with ophthalmology soon.  Stroke like symptoms? He has a history of TIA 2016 with L perioral and LUL paresthesias without recurrence Any tremors?  denies   Any incontinence of urine?  Endorsed. Uses pads    Any bowel dysfunction? He has issues with constipation but he does not take laxatives or follows the recommendations from his GI doctor, so his daughter cancelled further GI appts Patient lives  alone. History of heavy alcohol intake? denies   History of heavy tobacco use? denies   Family history of dementia?  Denies     Does patient drive? Yes"I don't speed"  " One time he has forgotten how to get home" ".He goes to the grocery store a lot"  Allergies  Allergen Reactions   Iohexol Nausea And Vomiting    Vomits immediately after IV contrast   Jardiance  [Empagliflozin]     Skin irritation and urinary leakage   Sucralfate Hives and Rash    Current Outpatient Medications  Medication Instructions   amLODipine (NORVASC) 5 mg, Oral, Daily   dicyclomine (BENTYL) 10 mg, Oral, 4 times daily PRN   glipiZIDE (GLUCOTROL) 5 mg, Oral, 2 times daily before meals   losartan-hydrochlorothiazide (HYZAAR) 50-12.5 MG tablet 1 tablet, Oral, Daily   METFORMIN HCL ER, OSM, PO 500 mg, Oral, 2 times daily, Am and pm   pantoprazole (PROTONIX) 40 mg, Oral, 2 times daily   pravastatin (PRAVACHOL) 20 mg, Oral, Every evening     VITALS:   Vitals:   08/13/22 0742  BP: (!) 147/73  Pulse: 71  Resp: 18  SpO2: 96%  Weight: 134 lb (60.8 kg)  Height: 5' 6.5" (1.689 m)       No data to display          PHYSICAL EXAM   HEENT:  Normocephalic, atraumatic. The mucous membranes are moist. The superficial temporal arteries are without ropiness or tenderness. Cardiovascular: Regular rate and rhythm. Lungs: Clear to auscultation bilaterally. Neck: There are no carotid bruits noted bilaterally.  NEUROLOGICAL:    08/13/2022   10:00 AM  Montreal Cognitive Assessment   Visuospatial/ Executive (0/5) 1  Naming (0/3) 3  Attention: Read list of digits (0/2) 2  Attention: Read list of letters (0/1) 1  Attention: Serial 7 subtraction starting at 100 (0/3) 0  Language: Repeat phrase (0/2) 1  Language : Fluency (0/1) 0  Abstraction (0/2) 0  Delayed Recall (0/5) 0  Orientation (0/6) 4  Total 12  Adjusted Score (based on education) 13        No data to display           Orientation:  Alert and oriented to person, not to place and date.  No aphasia or dysarthria. Fund of knowledge is reduced. Recent and remote memory impaired.  Attention and concentration are reduced.  Able to name objects and able to repeat phrases 1/2. Delayed recall 0/5   Cranial nerves: There is good facial symmetry. Anxious appearing. Extraocular muscles are intact and visual fields are  full to confrontational testing. Speech is fluent and clear, no tongue deviation. Hearing is intact to conversational tone.  Tone: Tone is good throughout. Sensation: Sensation is intact to light touch and pinprick throughout. Vibration is intact at the bilateral big toe. Coordination: The patient has no difficulty with RAM's or FNF bilaterally. Normal finger to nose  Motor: Strength is 5/5 in the bilateral upper and lower extremities. There is no pronator drift. There are no fasciculations noted. DTR's: Deep tendon reflexes are 2/4 .  Plantar responses are downgoing bilaterally. Gait and Station: The patient is able to ambulate without difficulty.  Gait is cautious and narrow.      Thank you  for allowing [place] the opportunity to participate in the care of this nice patient. Please do not hesitate to contact us for any questions or concerns.   Total time spent on today's visit was 62 minutes dedicated to this patient today, preparing to see patient, examining the patient, ordering tests and/or medications and counseling the patient, documenting clinical information in the EHR or other health record, independently interpreting results and communicating results to the patient/family, discussing treatment and goals, answering patient's questions and coordinating care.  Cc:  Justin Aldo, NP  Marlowe Kays 08/13/2022 10:16 AM

## 2022-08-18 ENCOUNTER — Ambulatory Visit
Admission: RE | Admit: 2022-08-18 | Discharge: 2022-08-18 | Disposition: A | Discharge status: Home / Self Care | Payer: Medicare Other | Source: Ambulatory Visit | Attending: Physical Medicine and Rehabilitation | Admitting: Physical Medicine and Rehabilitation

## 2022-08-18 DIAGNOSIS — K5904 Chronic idiopathic constipation: Secondary | ICD-10-CM

## 2022-08-18 DIAGNOSIS — G8929 Other chronic pain: Secondary | ICD-10-CM

## 2022-08-18 DIAGNOSIS — R1030 Lower abdominal pain, unspecified: Secondary | ICD-10-CM

## 2022-08-24 ENCOUNTER — Ambulatory Visit
Admission: RE | Admit: 2022-08-24 | Discharge: 2022-08-24 | Disposition: A | Discharge status: Home / Self Care | Payer: Medicare Other | Source: Ambulatory Visit | Attending: Physician Assistant | Admitting: Physician Assistant

## 2022-08-29 ENCOUNTER — Telehealth: Payer: Self-pay | Admitting: Physical Medicine and Rehabilitation

## 2022-08-29 NOTE — Telephone Encounter (Signed)
Called sister to discuss recent lumbar MRI imaging. No answer, did leave VM for her to call us back. Lumbar MRI imaging fairly normal for his age. We can look at injection therapy.

## 2022-10-16 ENCOUNTER — Telehealth: Payer: Self-pay | Admitting: Physical Medicine and Rehabilitation

## 2022-10-16 NOTE — Telephone Encounter (Signed)
Patient came in and needs to schedule a f/u for MRI Review. CB#[phone]

## 2022-10-18 NOTE — Telephone Encounter (Signed)
Spoke with patient and scheduled OV for 10/22/22.

## 2022-10-22 ENCOUNTER — Encounter: Payer: Self-pay | Admitting: Physical Medicine and Rehabilitation

## 2022-10-22 ENCOUNTER — Ambulatory Visit: Payer: Medicare Other | Admitting: Physical Medicine and Rehabilitation

## 2022-10-22 DIAGNOSIS — R1030 Lower abdominal pain, unspecified: Secondary | ICD-10-CM

## 2022-10-22 DIAGNOSIS — G8929 Other chronic pain: Secondary | ICD-10-CM | POA: Diagnosis not present

## 2022-10-22 DIAGNOSIS — K5904 Chronic idiopathic constipation: Secondary | ICD-10-CM | POA: Diagnosis not present

## 2022-10-22 DIAGNOSIS — M545 Low back pain, unspecified: Secondary | ICD-10-CM | POA: Diagnosis not present

## 2022-10-22 NOTE — Progress Notes (Signed)
Functional Pain Scale - descriptive words and definitions  Mild (2)   Noticeable when not distracted/no impact on ADL's/sleep only slightly affected and able to   use both passive and active distraction for comfort. Mild range order  Average Pain  Pain depends on activity  Abdominal pain. Has some lower back pain at times. MRI review

## 2022-10-22 NOTE — Progress Notes (Signed)
[name] - 72 y.o. male MRN [id number]  Date of birth: 1950/03/11  Office Visit Note: Visit Date: 10/22/2022 PCP: [name], NP Referred by: [name], NP  Subjective: Chief Complaint  Patient presents with   Lower Back - Pain   HPI: [name] is a 72 y.o. male who comes in today for evaluation and management at the request of [name], NP for chronic axial low back pain without any radicular complaints.  He originally saw [place] in July and my nurse practitioner along with myself saw him for evaluation.  At the time he had CT scans of his abdomen and lumbar spine but no MRI.  He has a chronic history of lower abdominal pain as well as idiopathic constipation.  He is followed in gastroenterology by Dr. Norwood Levo.  He has had full workup for his abdominal pain and this continues.  He is also had some memory loss issues and is seeing a neurologist at this point has had MRI of his brain.  He did obtain MRI of the lumbar spine and this is reviewed below in detail.  I reviewed the images with him using a spine model and the images itself.  By enlarge he actually has a very good spine for his age without any nerve compression and without any real significant arthritis.  He reports that his back pain is actually doing fairly well it does not keep him from cut his own grass and from walking which he enjoys doing.  He continues to exercise.  He has no radicular complaints down the legs he has no focal weakness.     Review of Systems  Gastrointestinal:  Positive for abdominal pain.  Musculoskeletal:  Positive for back pain.  All other systems reviewed and are negative.  Otherwise per HPI.  Assessment & Plan: Visit Diagnoses:    ICD-10-CM   1. Chronic bilateral low back pain without sciatica  M54.50    G89.29     2. Lower abdominal pain  R10.30     3. Chronic idiopathic constipation  K59.04        Plan: Findings:  Chronic history of back pain with intermittent severity  now doing fairly well and continues to exercise on his own.  MRI did show small central disc annular tear but no nerve compression and fairly normal spine for his age other than the small annular tear.  Annular tears can occur at any time and will maintain themselves on the MRI for some time without any real significance as a pain generator although it is hard to tell.  Now that he is doing well without too much in the way of back pain we will monitor him and he can call us anytime.  Could look at epidural injection if he was getting more radicular complaints or symptoms into the buttock area.  2.  Lower abdominal pain clearly shows no source from the spine itself.  This would be an upper lumbar lower thoracic region issue and we do not see any that on any of the prior imaging.  He will continue to follow-up with gastroenterology for his abdominal pain.    Meds & Orders: No orders of the defined types were placed in this encounter.  No orders of the defined types were placed in this encounter.   Follow-up: No follow-ups on file.   Procedures: No procedures performed      Clinical History: Narrative & Impression CLINICAL DATA:  Low back pain for  over 6 weeks.   EXAM: MRI LUMBAR SPINE WITHOUT CONTRAST   TECHNIQUE: Multiplanar, multisequence MR imaging of the lumbar spine was performed. No intravenous contrast was administered.   COMPARISON:  None Available.   FINDINGS: Segmentation:  Standard.   Alignment:  2 mm retrolisthesis of L5 on S1.   Vertebrae: No acute fracture, evidence of discitis, or aggressive bone lesion.   Conus medullaris and cauda equina: Conus extends to the L1 level. Conus and cauda equina appear normal.   Paraspinal and other soft tissues: No acute paraspinal abnormality.   Disc levels:   Disc spaces: Disc desiccation throughout the lumbar spine. Mild disc height loss at L5-S1.   T12-L1: Mild broad-based disc bulge. Mild bilateral facet arthropathy. No  foraminal or central canal stenosis.   L1-L2: Mild broad-based disc bulge. Mild bilateral facet arthropathy. No foraminal or central canal stenosis.   L2-L3: Mild broad-based disc bulge. Mild bilateral facet arthropathy. No foraminal or central canal stenosis.   L3-L4: Mild broad-based disc bulge. Mild bilateral facet arthropathy. No foraminal or central canal stenosis.   L4-L5: Broad-based disc bulge with a broad central disc protrusion. Mild bilateral facet arthropathy. No foraminal stenosis. No spinal stenosis.   L5-S1: Mild broad-based disc bulge. Moderate bilateral foraminal stenosis. No spinal stenosis.   IMPRESSION: 1. Lumbar spine spondylosis as described above. 2. No acute osseous injury of the lumbar spine.     Electronically Signed   By: Elige Ko M.D.   On: 08/29/2022 10:22   He reports that he has never smoked. He has never used smokeless tobacco. No results for input(s): "HGBA1C", "LABURIC" in the last 8760 hours.  Objective:  VS:  HT:    WT:   BMI:     BP:   HR: bpm  TEMP: ( )  RESP:  Physical Exam Vitals and nursing note reviewed.  Constitutional:      General: He is not in acute distress.    Appearance: Normal appearance. He is well-developed.  HENT:     Head: Normocephalic and atraumatic.  Eyes:     Conjunctiva/sclera: Conjunctivae normal.     Pupils: Pupils are equal, round, and reactive to light.  Cardiovascular:     Rate and Rhythm: Normal rate.     Pulses: Normal pulses.     Heart sounds: Normal heart sounds.  Pulmonary:     Effort: Pulmonary effort is normal. No respiratory distress.  Abdominal:     General: Abdomen is flat. There is no distension.     Palpations: Abdomen is soft.     Tenderness: There is no guarding or rebound.  Musculoskeletal:     Cervical back: Normal range of motion and neck supple. No rigidity.     Right lower leg: No edema.     Left lower leg: No edema.     Comments: Patient little bit slow to stand from  seated position but has good range of motion with no hip pain with rotation he has good distal strength bilaterally.  Skin:    General: Skin is warm and dry.     Findings: No erythema or rash.  Neurological:     General: No focal deficit present.     Mental Status: He is alert and oriented to person, place, and time.     Cranial Nerves: No cranial nerve deficit.     Sensory: No sensory deficit.     Motor: No weakness.     Coordination: Coordination normal.     Gait:  Gait normal.  Psychiatric:        Mood and Affect: Mood normal.        Behavior: Behavior normal.     Ortho Exam  Imaging: No results found.  Past Medical/Family/Surgical/Social History: Medications & Allergies reviewed per EMR, new medications updated. Patient Active Problem List   Diagnosis Date Noted   DDD (degenerative disc disease), cervical 11/12/2019   Cervical radiculopathy 11/12/2019   Left facial numbness    Left sided numbness 02/01/2014   TIA (transient ischemic attack) 02/01/2014   Left-sided weakness    Pain in the chest    Lower abdominal pain 01/31/2014   Unintentional weight loss 01/31/2014   Chest pain 01/30/2014   Headache 03/26/2013   Essential hypertension    Chest pain 01/13/2011   Diabetes mellitus type 2, noninsulin dependent (HCC) 01/13/2011   GERD (gastroesophageal reflux disease) 01/13/2011   Hypertensive heart disease without CHF    Hyperlipidemia    Depression    History of prostate cancer 08/22/2004   Past Medical History:  Diagnosis Date   Depression    Diabetes mellitus type 2, noninsulin dependent (HCC) 01/13/2011   Diverticulosis    Esophageal spasm    Essential hypertension    Fatty liver    GERD (gastroesophageal reflux disease) 01/13/2011   History of cardiac cath    a. 2005: normal cors, normal EF. b. Nuc 12/2010: normal, EF 61%. 2D Echo 12/2010: EF normal (% not given), no RWMA, trivial AI, aortic root trivially dilated, mild MR.   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 stroke   CAD Brother        Bypass in his 54's.   Stomach cancer Neg Hx    Esophageal cancer Neg Hx    Colon polyps Neg Hx    Past Surgical History:  Procedure Laterality Date   CARDIAC CATHETERIZATION  2005   No CAD   CIRCUMCISION N/A 07/23/2018   Procedure: CIRCUMCISION ADULT;  Surgeon: Crista Elliot, MD;  Location: [facility];  Service: Urology;  Laterality: N/A;   COLONOSCOPY     PROSTATECTOMY     Social History   Occupational History   Occupation: BUS DRIVER    Employer: [employer]  Tobacco Use   Smoking status: Never   Smokeless tobacco: Never  Vaping Use   Vaping status: Never Used  Substance and Sexual Activity   Alcohol use: No   Drug use: No   Sexual activity: Not on file

## 2022-11-05 ENCOUNTER — Encounter: Payer: Self-pay | Admitting: Psychology

## 2022-11-05 ENCOUNTER — Ambulatory Visit: Payer: Medicare Other | Admitting: Psychology

## 2022-11-05 DIAGNOSIS — F0280 Dementia in other diseases classified elsewhere without behavioral disturbance: Secondary | ICD-10-CM | POA: Diagnosis not present

## 2022-11-05 DIAGNOSIS — G309 Alzheimer's disease, unspecified: Secondary | ICD-10-CM | POA: Diagnosis not present

## 2022-11-05 DIAGNOSIS — R4189 Other symptoms and signs involving cognitive functions and awareness: Secondary | ICD-10-CM

## 2022-11-05 HISTORY — DX: Dementia in other diseases classified elsewhere, unspecified severity, without behavioral disturbance, psychotic disturbance, mood disturbance, and anxiety: F02.80

## 2022-11-05 NOTE — Progress Notes (Signed)
   Psychometrician Note   Cognitive testing was administered to [name] by Wallace Keller, B.S. (psychometrist) under the supervision of Dr. Newman Nickels, Ph.D., licensed psychologist on 11/05/2022. [name] did not appear overtly distressed by the testing session per behavioral observation or responses across self-report questionnaires. Rest breaks were offered.    The battery of tests administered was selected by Dr. Newman Nickels, Ph.D. with consideration to [name]'s current level of functioning, the nature of his symptoms, emotional and behavioral responses during interview, level of literacy, observed level of motivation/effort, and the nature of the referral question. This battery was communicated to the psychometrist. Communication between Dr. Newman Nickels, Ph.D. and the psychometrist was ongoing throughout the evaluation and Dr. Newman Nickels, Ph.D. was immediately accessible at all times. Dr. Newman Nickels, Ph.D. provided supervision to the psychometrist on the date of this service to the extent necessary to assure the quality of all services provided.    [name] will return within approximately 1-2 weeks for an interactive feedback session with Dr. Milbert Coulter at which time his test performances, clinical impressions, and treatment recommendations will be reviewed in detail. [name] understands he can contact our office should he require our assistance before this time.  A total of 110 minutes of billable time were spent face-to-face with [name] by the psychometrist. This includes both test administration and scoring time. Billing for these services is reflected in the clinical report generated by Dr. Newman Nickels, Ph.D.  This note reflects time spent with the psychometrician and does not include test scores or any clinical interpretations made by Dr. Milbert Coulter. The full report will follow in a separate note.

## 2022-11-05 NOTE — Progress Notes (Unsigned)
NEUROPSYCHOLOGICAL EVALUATION [place]. [facility] Department of Neurology  Date of Evaluation: November 05, 2022  Reason for Referral:   [name] is a 72 y.o. right-handed African-American male referred by Marlowe Kays, PA-C, to characterize his current cognitive functioning and assist with diagnostic clarity and treatment planning in the context of concern surrounding progressive cognitive decline.   Assessment and Plan:   Clinical Impression(s): [name] pattern of performance is suggestive of severe impairment surrounding all aspects of learning and memory. Additional impairments were exhibited across cognitive flexibility and phonemic fluency, while performance variability was exhibited across processing speed, confrontation naming, and visuospatial abilities. Performances were appropriate relative to age-matched peers across basic attention, receptive language and semantic fluency. Functionally, [name] denied all concerns. Unfortunately, [name] came to his current appointment unaccompanied and I do not have any informant-supplied information to provide a potentially more accurate description of his true degree of independence and day-to-day functioning. Medical records do suggest that his family has fully taken over financial management and bill paying responsibilities and that there have been concerns surrounding medication management and poor adherence due to forgetfulness in the past. Taking this into consideration, I feel that he best meets diagnostic criteria for a Major Neurocognitive Disorder ("dementia"). He would be towards the mild end of this spectrum presently. If his family does provide agreement with [name] reporting of full functional independence with no day-to-day difficulties, then a mild neurocognitive disorder designation would be more accurate, towards the severe end of that spectrum.   Regarding the cause for severe memory  impairment, I do have primary concerns surrounding an underlying neurodegenerative illness, namely Alzheimer's disease. Across memory tasks, [name] did not benefit from repeated exposure to novel information, was fully amnestic (i.e., 0% retention) across 2/3 tasks and only exhibited 14% retention across a final memory task after brief delays, and performed poorly across yes/no recognition trials. This suggests evidence for rapid forgetting and an evolving and already quite significant storage impairment, both of which are the hallmark testing patterns for Alzheimer's disease. His recent brain MRI also suggested "subjective temporal lobe predominance" for ongoing atrophy, which, if this becomes objectively true, is a well-established risk factor for Alzheimer's disease. Further dysfunction surrounding confrontation naming and cognitive flexibility align with expected disease progression. Continued medical monitoring will be important moving forward.   Recommendations: A repeat neuropsychological evaluation in 12-18 months (or sooner if functional decline is noted) is recommended to assess the trajectory of future cognitive decline should it occur. This will also aid in future efforts towards improved diagnostic clarity.  [name] should discuss medications aimed to address memory loss and concerns surrounding Alzheimer's disease with Ms. Wertman. It is important to highlight that these medications have been shown to slow functional decline in some individuals. There is no current treatment which can stop or reverse cognitive decline when caused by a neurodegenerative illness.   Performance across neurocognitive testing is not a strong predictor of an individual's safety operating a motor vehicle. With that being said, I do have some concern surrounding driving safety given impairments surrounding cognitive flexibility and memory. I would recommend that he have a formal driving evaluation conducted to  better inform safety behind the wheel. Should his family wish to pursue a formalized driving evaluation, they could reach out to the following agencies: [employer] in [place]: [phone] [facility]: [phone] [facility]: [phone] [facility]: [phone] or [phone]  Should there be  progression of current deficits over time, [name] is unlikely to regain any independent living skills lost. Therefore, it is recommended that he remain as involved as possible in all aspects of household chores, finances, and medication management, with supervision to ensure adequate performance. He will likely benefit from the establishment and maintenance of a routine in order to maximize his functional abilities over time.  It will be important for [name] to have another person with him when in situations where he may need to process information, weigh the pros and cons of different options, and make decisions, in order to ensure that he fully understands and recalls all information to be considered. I would also strongly recommend that family attend all medical appointments with him whenever possible.   If not already done, [name] and his family may want to discuss his wishes regarding durable power of attorney and medical decision making, so that he can have input into these choices. If they require legal assistance with this, long-term care resource access, or other aspects of estate planning, they could reach out to [employer] at [phone] for a free consultation. Additionally, they may wish to discuss future plans for caretaking and seek out community options for in home/residential care should they become necessary.  [name] is encouraged to attend to lifestyle factors for brain health (e.g., regular physical exercise, good nutrition habits and consideration of the MIND-DASH diet, regular participation in  cognitively-stimulating activities, and general stress management techniques), which are likely to have benefits for both emotional adjustment and cognition. Optimal control of vascular risk factors (including safe cardiovascular exercise and adherence to dietary recommendations) is encouraged. Continued participation in activities which provide mental stimulation and social interaction is also recommended.   Important information should be provided to [name] in written format in all instances. This information should be placed in a highly frequented and easily visible location within his home to promote recall. External strategies such as written notes in a consistently used memory journal, visual and nonverbal auditory cues such as a calendar on the refrigerator or appointments with alarm, such as on a cell phone, can also help maximize recall.  To address problems with processing speed, he may wish to consider:   -Ensuring that he is alerted when essential material or instructions are being presented   -Adjusting the speed at which new information is presented   -Allowing for more time in comprehending, processing, and responding in conversation   -Repeating and paraphrasing instructions or conversations aloud  To address problems with fluctuating attention and/or executive dysfunction, he may wish to consider:   -Avoiding external distractions when needing to concentrate   -Limiting exposure to fast paced environments with multiple sensory demands   -Writing down complicated information and using checklists   -Attempting and completing one task at a time (i.e., no multi-tasking)   -Verbalizing aloud each step of a task to maintain focus   -Taking frequent breaks during the completion of steps/tasks to avoid fatigue   -Reducing the amount of information considered at one time   -Scheduling more difficult activities for a time of day where he is usually most alert  Review of Records:    [name] was seen by [facility] Marlowe Kays, PA-C) on 08/13/2022 for an evaluation of memory loss. Per his family, memory difficulties have been present for the prior two years. They described him having trouble recalling details of recent conversations and increased repetition in day-to-day conversation, as well  as concern for confabulation. Family also expressed concerns surrounding potential olfactory hallucinations (e.g., "the water has an odor to it" or "the car smells of burning." Family further expressed some concern surrounding paranoia surrounding his car and it potentially malfunctioning. They highlighted that he had been to numerous mechanics who could not discover any concern, with him stating that he would "call [employer] Functionally, he continues to drive. He denied trouble with medication management; however, family expressed concerns surrounding medication adherence. Family has also taken over financial management and bill paying. Performance on a brief cognitive screening instrument ([name]) was 13/30. Ultimately, [name] was referred for a comprehensive neuropsychological evaluation to characterize his cognitive abilities and to assist with diagnostic clarity and treatment planning.   Neuroimaging Brain MRI on 02/01/2014 in the context of stroke/TIA concerns was unremarkable. Brain MRI on 09/04/2022 revealed generalized age-related atrophy with the potential for some "subjective temporal lobe predominance." Mild microvascular disease was revealed, said to be minimally progressive relative to a prior 2016 scan.   Past Medical History:  Diagnosis Date   Cervical radiculopathy 11/12/2019   Chest pain 01/13/2011   DDD (degenerative disc disease), cervical 11/12/2019   Diabetes mellitus type 2, noninsulin dependent 01/13/2011   Diverticulosis    Esophageal spasm    Essential hypertension    Fatty liver    GERD (gastroesophageal reflux disease) 01/13/2011   Headache  03/26/2013   History of cardiac cath    a. 2005: normal cors, normal EF. b. Nuc 12/2010: normal, EF 61%. 2D Echo 12/2010: EF normal (% not given), no RWMA, trivial AI, aortic root trivially dilated, mild MR.   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 Wanda Plump     Hyperlipidemia    Left sided numbness 02/01/2014   associated with TIA   Left-sided weakness    associated with TIA   Lower abdominal pain 01/31/2014   Seasonal allergies    TIA (transient ischemic attack) 02/01/2014   Unintentional weight loss 01/31/2014   Wears glasses     Past Surgical History:  Procedure Laterality Date   CARDIAC CATHETERIZATION  2005   No CAD   CIRCUMCISION N/A 07/23/2018   Procedure: CIRCUMCISION ADULT;  Surgeon: Crista Elliot, MD;  Location: [facility];  Service: Urology;  Laterality: N/A;   COLONOSCOPY     PROSTATECTOMY      Current Outpatient Medications:    amLODipine (NORVASC) 5 MG tablet, Take 1 tablet (5 mg total) by mouth daily., Disp: 90 tablet, Rfl: 3   dicyclomine (BENTYL) 10 MG capsule, Take 1 capsule (10 mg total) by mouth 4 (four) times daily as needed for spasms., Disp: 90 capsule, Rfl: 2   glipiZIDE (GLUCOTROL) 5 MG tablet, Take 5 mg by mouth 2 (two) times daily before a meal., Disp: , Rfl:    losartan-hydrochlorothiazide (HYZAAR) 50-12.5 MG tablet, Take 1 tablet by mouth daily., Disp: 90 tablet, Rfl: 3   METFORMIN HCL ER, OSM, PO, Take 500 mg by mouth in the morning and at bedtime. Am and pm, Disp: , Rfl:    pantoprazole (PROTONIX) 40 MG tablet, Take 40 mg by mouth 2 (two) times daily., Disp: , Rfl:    pravastatin (PRAVACHOL) 20 MG tablet, Take 1 tablet (20 mg total) by mouth every evening., Disp: 90 tablet, Rfl: 3  Clinical Interview:   The following information was obtained during a clinical interview with [name] prior to cognitive testing.  Cognitive  Symptoms: Decreased short-term memory: Denied. He  did state that "my family thinks I have dementia but I don't have any of that, I'm doing fine on my own."  Decreased long-term memory: Denied. Decreased attention/concentration: Denied. Reduced processing speed: Denied. Difficulties with executive functions: Denied. He also denied trouble with impulsivity or any significant personality changes.  Difficulties with emotion regulation: Denied. Difficulties with receptive language: Denied. Difficulties with word finding: Denied. Decreased visuoperceptual ability: Denied.  Difficulties completing ADLs: Denied. He reported complete independence with daily functioning. Medical records suggest family having concerns surrounding medication adherence, noting that he had forgotten to take his blood pressure medication prior to his last visit to see Ms. Wertman. Medical records also suggest that family has fully taken over financial management and bill paying responsibilities, including placing many bills on auto-draft. He continues to drive without reported difficulty. Records suggest his family previously describing an instance while driving where he had forgotten how to get home.   Additional Medical History: History of traumatic brain injury/concussion: Denied. History of stroke: Denied. Medical records suggest a prior TIA in 2016 with some transient left-sided numbness/weakness which resolved.  History of seizure activity: Denied. History of known exposure to toxins: Denied. Symptoms of chronic pain: Denied. Experience of frequent headaches/migraines: Denied. He described intermittent headache symptoms which are manageable without taking any form of medication.  Frequent instances of dizziness/vertigo: Denied.  Sensory changes: He wears glasses with benefit. He reported needing to see his physician about cataract concerns. Other sensory changes/difficulties (e.g., hearing, taste, smell) were denied.  Balance/coordination difficulties: Denied. He also  denied any recent falls.  Other motor difficulties: Denied.  Sleep History: Estimated hours obtained each night: 8 hours.  Difficulties falling asleep: Denied. Difficulties staying asleep: Denied. Feels rested and refreshed upon awakening: Endorsed.  History of snoring: Denied. History of waking up gasping for air: Denied. Witnessed breath cessation while asleep: Denied.  History of vivid dreaming: Denied. Excessive movement while asleep: Denied. Instances of acting out his dreams: Denied.  Psychiatric/Behavioral Health History: Depression: He described his current mood as "good" and denied to his knowledge any prior mental health concerns or diagnoses. Current or remote suicidal ideation, intent, or plan was denied.  Anxiety: Denied. Mania: Denied. Trauma History: Denied. Visual/auditory hallucinations: Denied. Delusional thoughts: Denied.  Tobacco: Denied. Alcohol: He denied current alcohol consumption as well as a history of problematic alcohol abuse or dependence.  Recreational drugs: Denied.  Family History: Problem Relation Age of Onset   Cancer Mother    Colon cancer Father    COPD Sister    Hypertension Sister    Stroke Brother        Larey Seat and hit head, ? stroke   CAD Brother        Bypass in his 78's.   Stomach cancer Neg Hx    Esophageal cancer Neg Hx    Colon polyps Neg Hx    This information was confirmed by [name].  Academic/Vocational History: Highest level of educational attainment: 12 years. He left high school in the 11th grade. 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 He reported going back to school and earning his high school diploma as an adult. He described himself as an average student in academic settings. No relative weaknesses were identified.  History of developmental delay: Denied. History of grade repetition: Denied. Enrollment in special education courses: Denied. History of LD/ADHD:  Denied.  Employment: Retired. 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   Evaluation Results:   Behavioral Observations: [name] was unaccompanied, arrived to his appointment on time, and was appropriately dressed and groomed. He appeared alert. Observed gait and station were within normal limits. Gross motor functioning appeared intact upon informal observation and no abnormal movements (e.g., tremors) were noted. His affect was generally relaxed and positive. Spontaneous speech was fluent and word finding difficulties were not observed during the clinical interview. Thought processes were coherent, organized, and normal in content. Insight into his cognitive difficulties appeared very poor as Mr. Lirette denied all cognitive concerns despite objective testing revealing severe impairments.   During testing, sustained attention was appropriate. Task engagement was adequate and he persisted when challenged. He was extremely repetitive throughout testing, often asking the psychometrist the same questions within minutes of him previously asking and receiving a response. Similar to his interview, he was also repetitive in stating how he "built a house" recently, is fully functional, and does not have anything wrong with his memory. He did fatigue as the evaluation progressed and exhibited more limited testing tolerance. Testing was abbreviated in response. Overall, Mr. Porzio was cooperative with the clinical interview and subsequent testing procedures.   Adequacy of Effort: The validity of neuropsychological testing is limited by the extent to which the individual being tested may be assumed to have exerted adequate effort during testing. Mr. Torrance expressed his intention to perform to the best of his abilities and exhibited adequate task engagement and persistence. Scores across stand-alone and embedded performance validity measures were variable. However,  his sole below expectation performance is believed to be due to true and severe memory impairment rather than poor engagement or attempts to perform poorly. As such, the results of the current evaluation are believed to be a valid representation of Mr. Dolley's current cognitive functioning.  Test Results: Mr. Grauberger was mildly disoriented at the time of the current evaluation. He was unable to state the current month or date.   Intellectual abilities based upon educational and vocational attainment were estimated to be in the average range. Premorbid abilities were estimated to be within the well below average range based upon a single-word reading test.   Processing speed was variable, ranging from the well below average to average normative ranges. Basic attention was below average. More complex attention (e.g., working memory) was unable to be assessed due to fatigue and limited testing tolerance. Cognitive flexibility was exceptionally low. Other aspects of executive functioning were unable to be assessed.  While not directly assessed, receptive language abilities were believed to be adequate. Mr. Krupka did not exhibit prominent difficulties comprehending task instructions and answered all questions asked of him appropriately. Assessed expressive language (e.g., verbal fluency and confrontation naming) was variable. Phonemic fluency was exceptionally low to well below average, semantic fluency was below average to average, and confrontation naming was average across a screening task but exceptionally low across a more comprehensive task.      Assessed visuospatial/visuoconstructional abilities were variable, ranging from the exceptionally low to above average normative ranges.    Learning (i.e., encoding) of novel verbal information was exceptionally low to well below average. Spontaneous delayed recall (i.e., retrieval) of previously learned information was exceptionally low. Retention rates  were 14% (raw score of 1) across a story learning task, 0% across a list learning task, and 0% across a figure drawing task. Performance across recognition tasks was exceptionally low to below average, suggesting very limited evidence for  information consolidation.   Results of emotional screening instruments suggested that recent symptoms of generalized anxiety were in the minimal range, while symptoms of depression were within normal limits. A screening instrument assessing recent sleep quality suggested the presence of minimal sleep dysfunction.  Tables of Scores:   Note: This summary of test scores accompanies the interpretive report and should not be considered in isolation without reference to the appropriate sections in the text. Descriptors are based on appropriate normative data and may be adjusted based on clinical judgment. Terms such as "Within Normal Limits" and "Outside Normal Limits" are used when a more specific description of the test score cannot be determined.       Percentile - Normative Descriptor > 98 - Exceptionally High 91-97 - Well Above Average 75-90 - Above Average 25-74 - Average 9-24 - Below Average 2-8 - Well Below Average < 2 - Exceptionally Low       Validity:   DESCRIPTOR       DCT: --- --- Within Normal Limits  RBANS EI: --- --- Outside Normal Limits       Orientation:      Raw Score Percentile   NAB Orientation, Form 1 25/29 --- ---       Cognitive Screening:      Raw Score Percentile   SLUMS: 16/30 --- ---       RBANS, Form A: Standard Score/ Scaled Score Percentile   Total Score 64 1 Exceptionally Low  Immediate Memory 65 1 Exceptionally Low    List Learning 3 1 Exceptionally Low    Story Memory 5 5 Well Below Average  Visuospatial/Constructional 87 19 Below Average    Figure Copy 12 75 Above Average    Line Orientation 9/20 <2 Exceptionally Low  Language 90 25 Average    Picture Naming 9/10 26-50 Average    Semantic Fluency 6 9 Below  Average  Attention 75 5 Well Below Average    Digit Span 7 16 Below Average    Coding 5 5 Well Below Average  Delayed Memory 40 <1 Exceptionally Low    List Recall 0/10 <2 Exceptionally Low    List Recognition 12/20 <2 Exceptionally Low    Story Recall 2 <1 Exceptionally Low    Story Recognition 8/12 8-15 Well Below Average  to Below Average    Figure Recall 1 <1 Exceptionally Low    Figure Recognition 0/8 1 Exceptionally Low        Intellectual Functioning:      Standard Score Percentile   Barona Formula Estimated Premorbid IQ: 94 34 Average        Standard Score Percentile   Test of Premorbid Functioning: 76 5 Well Below Average       Attention/Executive Function:     Trail Making Test (TMT): Raw Score (T Score) Percentile     Part A 52 secs.,  0 errors (49) 46 Average    Part B Discontinued --- Impaired        D-KEFS Verbal Fluency Test: Raw Score (Scaled Score) Percentile     Letter Total Correct 13 (3) 1 Exceptionally Low    Category Total Correct 24 (6) 9 Below Average    Category Switching Total Correct 9 (6) 9 Below Average    Category Switching Accuracy 8 (7) 16 Below Average      Total Set Loss Errors 2 (10) 50 Average      Total Repetition Errors 6 (7) 16 Below Average  Language:     Verbal Fluency Test: Raw Score (T Score) Percentile     Phonemic Fluency (FAS) 13 (32) 4 Well Below Average    Animal Fluency 12 (45) 31 Average        NAB Language Module, Form 1: T Score Percentile     Naming 22/31 (24) <1 Exceptionally Low       Visuospatial/Visuoconstruction:      Raw Score Percentile   Clock Drawing: 9/10 --- Within Normal Limits       Mood and Personality:      Raw Score Percentile   Geriatric Depression Scale: 2 --- Within Normal Limits  Geriatric Anxiety Scale: 1 --- Minimal    Somatic 0 --- Minimal    Cognitive 1 --- Minimal    Affective 0 --- Minimal       Additional Questionnaires:      Raw Score Percentile   PROMIS Sleep Disturbance  Questionnaire: 9 --- None to Slight   Informed Consent and Coding/Compliance:   The current evaluation represents a clinical evaluation for the purposes previously outlined by the referral source and is in no way reflective of a forensic evaluation.   Mr. Righi was provided with a verbal description of the nature and purpose of the present neuropsychological evaluation. Also reviewed were the foreseeable risks and/or discomforts and benefits of the procedure, limits of confidentiality, and mandatory reporting requirements of this provider. The patient was given the opportunity to ask questions and receive answers about the evaluation. Oral consent to participate was provided by the patient.   This evaluation was conducted by Newman Nickels, Ph.D., ABPP-CN, board certified clinical neuropsychologist. Mr. Bayle completed a clinical interview with Dr. Milbert Coulter, billed as one unit [phone], and 110 minutes of cognitive testing and scoring, billed as one unit [phone] and three additional units [id number]. Psychometrist Wallace Keller, B.S. assisted Dr. Milbert Coulter with test administration and scoring procedures. As a separate and discrete service, one unit [id number] and two units [phone] were billed for Dr. Tammy Sours time spent in interpretation and report writing.

## 2022-11-06 ENCOUNTER — Ambulatory Visit: Payer: Medicare Other | Admitting: Physician Assistant

## 2022-11-06 ENCOUNTER — Encounter: Payer: Self-pay | Admitting: Psychology

## 2022-11-12 ENCOUNTER — Telehealth: Payer: Medicare Other | Admitting: Psychology

## 2022-11-12 DIAGNOSIS — F0280 Dementia in other diseases classified elsewhere without behavioral disturbance: Secondary | ICD-10-CM | POA: Diagnosis not present

## 2022-11-12 DIAGNOSIS — G309 Alzheimer's disease, unspecified: Secondary | ICD-10-CM

## 2022-11-12 NOTE — Progress Notes (Addendum)
   Neuropsychology Feedback Session [name]. [facility] [place] Department of Neurology  Reason for Referral:   [name] is a 72 y.o. right-handed African-American male referred by Marlowe Kays, PA-C, to characterize his current cognitive functioning and assist with diagnostic clarity and treatment planning in the context of concern surrounding progressive cognitive decline.   Feedback:   Mr. Alsman completed a comprehensive neuropsychological evaluation on 11/05/2022. Please refer to that encounter for the full report and recommendations. Briefly, results suggested severe impairment surrounding all aspects of learning and memory. Additional impairments were exhibited across cognitive flexibility and phonemic fluency, while performance variability was exhibited across processing speed, confrontation naming, and visuospatial abilities. Regarding the cause for severe memory impairment, I do have primary concerns surrounding an underlying neurodegenerative illness, namely Alzheimer's disease. Across memory tasks, Mr. [name] did not benefit from repeated exposure to novel information, was fully amnestic (i.e., 0% retention) across 2/3 tasks and only exhibited 14% retention across a final memory task after brief delays, and performed poorly across yes/no recognition trials. This suggests evidence for rapid forgetting and an evolving and already quite significant storage impairment, both of which are the hallmark testing patterns for Alzheimer's disease. His recent brain MRI also suggested "subjective temporal lobe predominance" for ongoing atrophy, which, if this becomes objectively true, is a well-established risk factor for Alzheimer's disease. Further dysfunction surrounding confrontation naming and cognitive flexibility align with expected disease progression. Continued medical monitoring will be important moving forward.   Mr. [name] was accompanied by his daughter during the  current virtual video-based appointment. He was within his residence while I was within my office. I discussed the limitations of evaluation and management by telemedicine and the availability of in person appointments. Mr. [name] expressed his understanding and agreed to proceed. Content of the current session focused on the results of his neuropsychological evaluation. Mr. [name] was given the opportunity to ask questions and his questions were answered. He was encouraged to reach out should additional questions arise. His report is available to him on MyChart.      One unit [phone] was billed for Dr. Tammy Sours time spent preparing for, conducting, and documenting the current feedback session with Mr. [name].

## 2022-12-13 ENCOUNTER — Ambulatory Visit: Payer: Medicare Other | Admitting: Physician Assistant

## 2022-12-17 ENCOUNTER — Ambulatory Visit: Payer: Medicare Other | Admitting: Nurse Practitioner

## 2023-01-14 ENCOUNTER — Encounter: Payer: Self-pay | Admitting: Physical Medicine and Rehabilitation

## 2023-01-14 ENCOUNTER — Ambulatory Visit: Payer: Medicare Other | Admitting: Physical Medicine and Rehabilitation

## 2023-01-14 DIAGNOSIS — M545 Low back pain, unspecified: Secondary | ICD-10-CM | POA: Diagnosis not present

## 2023-01-14 DIAGNOSIS — K5904 Chronic idiopathic constipation: Secondary | ICD-10-CM | POA: Diagnosis not present

## 2023-01-14 DIAGNOSIS — R1030 Lower abdominal pain, unspecified: Secondary | ICD-10-CM | POA: Diagnosis not present

## 2023-01-14 DIAGNOSIS — G8929 Other chronic pain: Secondary | ICD-10-CM | POA: Diagnosis not present

## 2023-01-14 NOTE — Progress Notes (Unsigned)
CHIDUBEM PAE - 72 y.o. male MRN [id number]  Date of birth: 1950-11-10  Office Visit Note: Visit Date: 01/14/2023 PCP: [name], NP Referred by: [name], NP  Subjective: Chief Complaint  Patient presents with   Lower Back - Pain   HPI: [name] is a 72 y.o. male who comes in today for evaluation of chronic, worsening and severe bilateral lower back pain radiating around to front of abdomen. Symptoms ongoing for several months. He was originally seen in our office in July for same. His pain is constant, no specific aggravating factors. He describes pain as sore and aching sensation, currently rates as 5 out of 10. Some relief of pain with home exercise regimen, rest and use of medications. He is a very active person, does walk at park multiple times a week. Lumbar MRI imaging from July looks good for his age, no significant nerve compression, no high grade spinal canal stenosis noted. He is managed by Dr. Norwood Levo with GI, history of abnormal bowel habits and constipation, CT of abdomen and pelvis from April negative for acute findings, does show moderate colonic stool burden. He is also managed by Marlowe Kays, PA with [facility], does have history of memory impairment. Patient denies focal weakness, numbness and tingling. No recent trauma or falls.      Review of Systems  Gastrointestinal:  Positive for constipation.  Musculoskeletal:  Positive for back pain.  Neurological:  Negative for tingling, sensory change, focal weakness and weakness.  All other systems reviewed and are negative.  Otherwise per HPI.  Assessment & Plan: Visit Diagnoses:    ICD-10-CM   1. Chronic bilateral low back pain without sciatica  M54.50    G89.29     2. Lower abdominal pain  R10.30     3. Chronic idiopathic constipation  K59.04        Plan: Findings:  Chronic, worsening and severe bilateral lower back pain radiating around to front of abdomen. Patient continues to have  severe pain despite good conservative therapies such as home exercise regimen, rest and use of medications. We discussed treatment plan in detail today. At this point, would not recommend interventional spine procedures. Patients clinical presentation and exam are consistent with chronic constipation. I prescribed Miralax for him to take and encouraged him to drink plenty of fluids. I also encouraged him to follow up with Dr. Leonides Schanz for further issues. I placed order for lumbar brace, we fitted him in the office today with Irena Cords brace, he tolerated without difficulty. Patient has no questions at this time. We are happy to see him back as needed. No red flag symptoms noted upon exam today.     Meds & Orders: No orders of the defined types were placed in this encounter.  No orders of the defined types were placed in this encounter.   Follow-up: Return if symptoms worsen or fail to improve.   Procedures: No procedures performed      Clinical History: Narrative & Impression CLINICAL DATA:  Low back pain for over 6 weeks.   EXAM: MRI LUMBAR SPINE WITHOUT CONTRAST   TECHNIQUE: Multiplanar, multisequence MR imaging of the lumbar spine was performed. No intravenous contrast was administered.   COMPARISON:  None Available.   FINDINGS: Segmentation:  Standard.   Alignment:  2 mm retrolisthesis of L5 on S1.   Vertebrae: No acute fracture, evidence of discitis, or aggressive bone lesion.   Conus medullaris and cauda equina: Conus extends to  the L1 level. Conus and cauda equina appear normal.   Paraspinal and other soft tissues: No acute paraspinal abnormality.   Disc levels:   Disc spaces: Disc desiccation throughout the lumbar spine. Mild disc height loss at L5-S1.   T12-L1: Mild broad-based disc bulge. Mild bilateral facet arthropathy. No foraminal or central canal stenosis.   L1-L2: Mild broad-based disc bulge. Mild bilateral facet arthropathy. No foraminal or central canal  stenosis.   L2-L3: Mild broad-based disc bulge. Mild bilateral facet arthropathy. No foraminal or central canal stenosis.   L3-L4: Mild broad-based disc bulge. Mild bilateral facet arthropathy. No foraminal or central canal stenosis.   L4-L5: Broad-based disc bulge with a broad central disc protrusion. Mild bilateral facet arthropathy. No foraminal stenosis. No spinal stenosis.   L5-S1: Mild broad-based disc bulge. Moderate bilateral foraminal stenosis. No spinal stenosis.   IMPRESSION: 1. Lumbar spine spondylosis as described above. 2. No acute osseous injury of the lumbar spine.     Electronically Signed   By: Elige Ko M.D.   On: 08/29/2022 10:22   He reports that he has never smoked. He has never used smokeless tobacco. No results for input(s): "HGBA1C", "LABURIC" in the last 8760 hours.  Objective:  VS:  HT:    WT:   BMI:     BP:   HR: bpm  TEMP: ( )  RESP:  Physical Exam Vitals and nursing note reviewed.  HENT:     Head: Normocephalic and atraumatic.     Right Ear: External ear normal.     Left Ear: External ear normal.     Nose: Nose normal.     Mouth/Throat:     Mouth: Mucous membranes are moist.  Eyes:     Extraocular Movements: Extraocular movements intact.  Cardiovascular:     Rate and Rhythm: Normal rate.     Pulses: Normal pulses.  Pulmonary:     Effort: Pulmonary effort is normal.  Abdominal:     General: Abdomen is flat. There is no distension.  Musculoskeletal:        General: No tenderness.     Cervical back: Normal range of motion.     Comments: Patient rises from seated position to standing without difficulty. Good lumbar range of motion. No pain noted with facet loading. 5/5 strength noted with bilateral hip flexion, knee flexion/extension, ankle dorsiflexion/plantarflexion and EHL. No clonus noted bilaterally. No pain upon palpation of greater trochanters. No pain with internal/external rotation of bilateral hips. Sensation intact  bilaterally. Negative slump test bilaterally. Ambulates without aid, gait steady.     Skin:    General: Skin is warm and dry.     Capillary Refill: Capillary refill takes less than 2 seconds.  Neurological:     General: No focal deficit present.     Mental Status: He is alert and oriented to person, place, and time.  Psychiatric:        Mood and Affect: Mood normal.        Behavior: Behavior normal.     Ortho Exam  Imaging: No results found.  Past Medical/Family/Surgical/Social History: Medications & Allergies reviewed per EMR, new medications updated. 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 11/05/2022   DDD (degenerative disc disease), cervical 11/12/2019   Cervical radiculopathy 11/12/2019   TIA (transient ischemic attack) 02/01/2014   Lower abdominal pain 01/31/2014   Unintentional weight loss 01/31/2014   Headache 03/26/2013   Essential hypertension  Diabetes mellitus type 2, noninsulin dependent 01/13/2011   GERD (gastroesophageal reflux disease) 01/13/2011   Hyperlipidemia    History of prostate cancer 08/22/2004   Past Medical History:  Diagnosis Date   Cervical radiculopathy 11/12/2019   Chest pain 01/13/2011   DDD (degenerative disc disease), cervical 11/12/2019   Diabetes mellitus type 2, noninsulin dependent 01/13/2011   Diverticulosis    Esophageal spasm    Essential hypertension    Fatty liver    GERD (gastroesophageal reflux disease) 01/13/2011   Headache 03/26/2013   History of cardiac cath    a. 2005: normal cors, normal EF. b. Nuc 12/2010: normal, EF 61%. 2D Echo 12/2010: EF normal (% not given), no RWMA, trivial AI, aortic root trivially dilated, mild MR.   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 stroke   CAD Brother        Bypass in his 66's.   Stomach cancer Neg Hx    Esophageal cancer Neg Hx    Colon polyps Neg Hx    Past Surgical History:  Procedure Laterality Date   CARDIAC CATHETERIZATION  2005   No CAD   CIRCUMCISION N/A 07/23/2018   Procedure: CIRCUMCISION ADULT;  Surgeon: Crista Elliot, MD;  Location: [facility];  Service: Urology;  Laterality: N/A;   COLONOSCOPY     PROSTATECTOMY     Social History   Occupational History   Occupation: Retired    Comment: Maintenance/custodial  Tobacco Use   Smoking status: Never   Smokeless tobacco: Never  Vaping Use   Vaping status: Never Used  Substance and Sexual Activity   Alcohol use: No   Drug use: No   Sexual activity: Not on file

## 2023-01-22 ENCOUNTER — Encounter: Payer: Self-pay | Admitting: Physician Assistant

## 2023-01-22 ENCOUNTER — Ambulatory Visit: Payer: Medicare Other | Admitting: Physician Assistant

## 2023-01-22 VITALS — BP 164/82 | HR 85 | Ht 66.5 in | Wt 136.0 lb

## 2023-01-22 DIAGNOSIS — R413 Other amnesia: Secondary | ICD-10-CM

## 2023-01-22 DIAGNOSIS — F0280 Dementia in other diseases classified elsewhere without behavioral disturbance: Secondary | ICD-10-CM

## 2023-01-22 DIAGNOSIS — R4189 Other symptoms and signs involving cognitive functions and awareness: Secondary | ICD-10-CM

## 2023-01-22 DIAGNOSIS — G309 Alzheimer's disease, unspecified: Secondary | ICD-10-CM | POA: Diagnosis not present

## 2023-01-22 MED ORDER — DONEPEZIL HCL 10 MG PO TABS: ORAL_TABLET | ORAL | 11 refills | Status: DC

## 2023-01-22 NOTE — Patient Instructions (Addendum)
 It was a pleasure to see you today at our office.   Recommendations:  Repeat Neurocognitive evaluation at our office   Start Donepezil  10 mg :Take half tablet (5 mg) daily for 2 weeks, then increase to the full tablet at 10 mg daily.   Follow up in 6  months   For psychiatric meds, mood meds: Please have your primary care physician manage these medications.  If you have any severe symptoms of a stroke, or other severe issues such as confusion,severe chills or fever, etc call 911 or go to the ER as you may need to be evaluated further  For guidance regarding [facility] and if placement were needed at the facility, contact Social Worker tel: [phone]  Consider [facility]  [address], [place] [address] [phone]  Hours of Operation Mondays to Thursdays: 8 am to 8 pm,Fridays: 9 am to 8 pm, Saturdays: 9 am to 1 pm Sundays: Closed  [email or web address]   For assessment of decision of mental capacity and competency:  Call Dr. Rosaline Nine, geriatric psychiatrist at [phone]  Counseling regarding caregiver distress, including caregiver depression, anxiety and issues regarding community resources, adult day care programs, adult living facilities, or memory care questions:  please contact your  Primary Doctor's Social Worker   Whom to call: Memory  decline, memory medications: Call our office [phone]    [email or web address]   RECOMMENDATIONS FOR ALL PATIENTS WITH MEMORY PROBLEMS: 1. Continue to exercise (Recommend 30 minutes of walking everyday, or 3 hours every week) 2. Increase social interactions - continue going to [place] and enjoy social gatherings with friends and family 3. Eat healthy, avoid fried foods and eat more fruits and vegetables 4. Maintain adequate blood pressure, blood sugar, and  blood cholesterol level. Reducing the risk of stroke and cardiovascular disease also helps promoting better memory. 5. Avoid stressful situations. Live a simple life and avoid aggravations. Organize your time and prepare for the next day in anticipation. 6. Sleep well, avoid any interruptions of sleep and avoid any distractions in the bedroom that may interfere with adequate sleep quality 7. Avoid sugar, avoid sweets as there is a strong link between excessive sugar intake, diabetes, and cognitive impairment We discussed the Mediterranean diet, which has been shown to help patients reduce the risk of progressive memory disorders and reduces cardiovascular risk. This includes eating fish, eat fruits and green leafy vegetables, nuts like almonds and hazelnuts, walnuts, and also use olive oil. Avoid fast foods and fried foods as much as possible. Avoid sweets and sugar as sugar use has been linked to worsening of memory function.  There is always a concern of gradual progression of memory problems. If this is the case, then we may need to adjust level of care according to patient needs. Support, both to the patient and caregiver, should then be put into place.      You have been referred for a neuropsychological evaluation (i.e., evaluation of memory and thinking abilities). Please bring someone with you to this appointment if possible, as it is helpful for the doctor to hear from both you and another adult who knows you well. Please bring eyeglasses and hearing aids if you wear them.    The evaluation will take approximately 3 hours and has two parts:   The first part is a clinical interview with the neuropsychologist (Dr. Richie or Dr. Jackquline). During the interview, the neuropsychologist will speak with you and the individual you brought to the  appointment.    The second part of the evaluation is testing with the doctor's technician Neal or Luke). During the testing, the technician will ask you to  remember different types of material, solve problems, and answer some questionnaires. Your family member will not be present for this portion of the evaluation.   Please note: We must reserve several hours of the neuropsychologist's time and the psychometrician's time for your evaluation appointment. As such, there is a No-Show fee of $100. If you are unable to attend any of your appointments, please contact our office as soon as possible to reschedule.      DRIVING: Regarding driving, in patients with progressive memory problems, driving will be impaired. We advise to have someone else do the driving if trouble finding directions or if minor accidents are reported. Independent driving assessment is available to determine safety of driving.   If you are interested in the driving assessment, you can contact the following:  [employer] in [place] [phone]  [facility] [phone]  [facility] [phone]  [facility] [phone] or [phone]   FALL PRECAUTIONS: Be cautious when walking. Scan the area for obstacles that may increase the risk of trips and falls. When getting up in the mornings, sit up at the edge of the bed for a few minutes before getting out of bed. Consider elevating the bed at the head end to avoid drop of blood pressure when getting up. Walk always in a well-lit room (use night lights in the walls). Avoid area rugs or power cords from appliances in the middle of the walkways. Use a walker or a cane if necessary and consider physical therapy for balance exercise. Get your eyesight checked regularly.  FINANCIAL OVERSIGHT: Supervision, especially oversight when making financial decisions or transactions is also recommended.  HOME SAFETY: Consider the safety of the kitchen when operating appliances like stoves, microwave oven, and blender. Consider having supervision and share cooking responsibilities until no longer  able to participate in those. Accidents with firearms and other hazards in the house should be identified and addressed as well.   ABILITY TO BE LEFT ALONE: If patient is unable to contact 911 operator, consider using LifeLine, or when the need is there, arrange for someone to stay with patients. Smoking is a fire hazard, consider supervision or cessation. Risk of wandering should be assessed by caregiver and if detected at any point, supervision and safe proof recommendations should be instituted.  MEDICATION SUPERVISION: Inability to self-administer medication needs to be constantly addressed. Implement a mechanism to ensure safe administration of the medications.      Mediterranean Diet A Mediterranean diet refers to food and lifestyle choices that are based on the traditions of countries located on the [employer]. This way of eating has been shown to help prevent certain conditions and improve outcomes for people who have chronic diseases, like kidney disease and heart disease. What are tips for following this plan? Lifestyle  Cook and eat meals together with your family, when possible. Drink enough fluid to keep your urine clear or pale yellow. Be physically active every day. This includes: Aerobic exercise like running or swimming. Leisure activities like gardening, walking, or housework. Get 7-8 hours of sleep each night. If recommended by your health care provider, drink red wine in moderation. This means 1 glass a day for nonpregnant women and 2 glasses a day for men. A glass of wine equals 5 oz (150 mL). Reading food labels  Check the serving size of packaged foods. For foods such as rice and pasta, the serving size refers to the amount of cooked product, not dry. Check the total fat in packaged foods. Avoid foods that have saturated fat or trans fats. Check the ingredients list for added sugars, such as corn syrup. Shopping  At the grocery store, buy most of your food  from the areas near the walls of the store. This includes: Fresh fruits and vegetables (produce). Grains, beans, nuts, and seeds. Some of these may be available in unpackaged forms or large amounts (in bulk). Fresh seafood. Poultry and eggs. Low-fat dairy products. Buy whole ingredients instead of prepackaged foods. Buy fresh fruits and vegetables in-season from local farmers markets. Buy frozen fruits and vegetables in resealable bags. If you do not have access to quality fresh seafood, buy precooked frozen shrimp or canned fish, such as tuna, salmon, or sardines. Buy small amounts of raw or cooked vegetables, salads, or olives from the deli or salad bar at your store. Stock your pantry so you always have certain foods on hand, such as olive oil, canned tuna, canned tomatoes, rice, pasta, and beans. Cooking  Cook foods with extra-virgin olive oil instead of using butter or other vegetable oils. Have meat as a side dish, and have vegetables or grains as your main dish. This means having meat in small portions or adding small amounts of meat to foods like pasta or stew. Use beans or vegetables instead of meat in common dishes like chili or lasagna. Experiment with different cooking methods. Try roasting or broiling vegetables instead of steaming or sauteing them. Add frozen vegetables to soups, stews, pasta, or rice. Add nuts or seeds for added healthy fat at each meal. You can add these to yogurt, salads, or vegetable dishes. Marinate fish or vegetables using olive oil, lemon juice, garlic, and fresh herbs. Meal planning  Plan to eat 1 vegetarian meal one day each week. Try to work up to 2 vegetarian meals, if possible. Eat seafood 2 or more times a week. Have healthy snacks readily available, such as: Vegetable sticks with hummus. Greek yogurt. Fruit and nut trail mix. Eat balanced meals throughout the week. This includes: Fruit: 2-3 servings a day Vegetables: 4-5 servings a  day Low-fat dairy: 2 servings a day Fish, poultry, or lean meat: 1 serving a day Beans and legumes: 2 or more servings a week Nuts and seeds: 1-2 servings a day Whole grains: 6-8 servings a day Extra-virgin olive oil: 3-4 servings a day Limit red meat and sweets to only a few servings a month What are my food choices? Mediterranean diet Recommended Grains: Whole-grain pasta. Brown rice. Bulgar wheat. Polenta. Couscous. Whole-wheat bread. [place] Made[place]. Vegetables: Artichokes. Beets. Broccoli. Cabbage. Carrots. Eggplant. Green beans. Chard. Kale. Spinach. Onions. Leeks. Peas. Squash. Tomatoes. Peppers. Radishes. Fruits: Apples. Apricots. Avocado. Berries. Bananas. Cherries. Dates. Figs. Grapes. Lemons. Melon. Oranges. Peaches. Plums. Pomegranate. Meats and other protein foods: Beans. Almonds. Sunflower seeds. Pine nuts. Peanuts. Cod. Salmon. Scallops. Shrimp. Tuna. Tilapia. Clams. Oysters. Eggs. Dairy: Low-fat milk. Cheese. Greek yogurt. Beverages: Water. Red wine. Herbal tea. Fats and oils: Extra virgin olive oil. Avocado oil. Grape seed oil. Sweets and desserts: Greek yogurt with honey. Baked apples. Poached pears. Trail mix. Seasoning and other foods: Basil. Cilantro. Coriander. Cumin. Mint. Parsley. Sage. Rosemary. Tarragon. Garlic. Oregano. Thyme. Pepper. Balsalmic vinegar. Tahini. Hummus. Tomato sauce. Olives. Mushrooms. Limit these Grains: Prepackaged pasta or rice dishes. Prepackaged cereal with added sugar. Vegetables: Deep  fried potatoes (french fries). Fruits: Fruit canned in syrup. Meats and other protein foods: Beef. Pork. Lamb. Poultry with skin. Hot dogs. [place]. Dairy: Ice cream. Sour cream. Whole milk. Beverages: Juice. Sugar-sweetened soft drinks. Beer. Liquor and spirits. Fats and oils: Butter. Canola oil. Vegetable oil. Beef fat (tallow). Lard. Sweets and desserts: Cookies. Cakes. Pies. Candy. Seasoning and other foods: Mayonnaise. Premade sauces and marinades. The  items listed may not be a complete list. Talk with your dietitian about what dietary choices are right for you. Summary The Mediterranean diet includes both food and lifestyle choices. Eat a variety of fresh fruits and vegetables, beans, nuts, seeds, and whole grains. Limit the amount of red meat and sweets that you eat. Talk with your health care provider about whether it is safe for you to drink red wine in moderation. This means 1 glass a day for nonpregnant women and 2 glasses a day for men. A glass of wine equals 5 oz (150 mL). This information is not intended to replace advice given to you by your health care provider. Make sure you discuss any questions you have with your health care provider. Document Released: 09/01/2015 Document Revised: 10/04/2015 Document Reviewed: 09/01/2015 Elsevier Interactive Patient Education  2017 [employer].   [facility] [phone] Labs today [address]

## 2023-01-22 NOTE — Progress Notes (Signed)
 Assessment/Plan:   Mild Dementia likely due to Alzheimer's disease     [name] is a very pleasant 72 y.o. 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 Patient not on antidementia meds, discussed with daughters and patient initiating therapy, they agree to start. He is still able to participate on some ADLs and to drive with close monitoring.   Follow up in 6  months. Start Donepezil  10 mg :Take half tablet (5 mg) daily for 2 weeks, then increase to the full tablet at 10 mg daily. Side effects discussed Repeat neuropsych evaluation in 6-12 months doe disease trajectory. Recommend good control of her cardiovascular risk factors Continue to control mood as per PCP Monitor driving.    Subjective:    This patient is accompanied in the office by his daughters who supplement  the history.  Previous records as well as any outside records available were reviewed prior to todays visit. Patient was last seen on  7/22/24with a [name] 13/30.     Any changes in memory since last visit?  His daughter reports that there are more changes Patient has some difficulty remembering recent conversations and names of people.  As before, he stories, there is more confabulation. He likes to read the Bible, do yard work. He takes a lot of notes-he, in a defensive way responds just the grocery list.  repeats oneself?  Endorsed over and over  Disoriented when walking into a room?  Patient denies    Leaving objects?  May misplace things, but not often, including money   Wandering behavior?  Denies.   Any personality changes since last visit? Denies. He is a little more verbal   Any worsening  depression?:  Denies.   Hallucinations or paranoia?  He has olfactory hallucinations as before, he smells gas in the car  He is paranoid about the money Seizures? denies    Any sleep changes?   Sleeps well. Denies vivid dreams, REM behavior or sleepwalking   Sleep apnea?   Denies.   Any hygiene concerns? Denies.  Independent of bathing and dressing?  Endorsed  Does the patient needs help with medications? Children is in charge, because he was not taking the medicines. During the visit he became very argumentative about it with his children regarding this issues.  Who is in charge of the finances?  Daughter is in charge, bills are on auto-pay, because he was missing bills and things were getting services like electricity, turned off.     Any changes in appetite?  Denies.     Patient have trouble swallowing? Denies.   Does the patient cook? Yes, denies any kitchen accidents.  Any headaches?   As before, he has headaches on top of my head sometimes.  Chronic back pain endorsed, lower back sees orthopedics, needs to wear a back brace  Ambulates with difficulty? Denies.    Recent falls or head injuries? denies     Unilateral weakness, numbness or tingling? Denies.   Any tremors?  Denies   Any anosmia?  Denies   Any incontinence of urine?  Endorsed, uses pads  Any bowel dysfunction?   Denies      Patient lives alone   Does the patient  drive? He continues to drive with close monitoring, short distances.   Neuropsych evaluation 07/2022 . Briefly, results suggested severe impairment surrounding all aspects of learning and memory. Additional impairments were exhibited across cognitive flexibility and phonemic fluency, while performance variability was exhibited across processing speed, confrontation naming, and visuospatial abilities. Regarding the cause for severe memory impairment, I do have primary concerns surrounding an underlying neurodegenerative illness, namely Alzheimer's disease. Across  memory tasks, [name] did not benefit from repeated exposure to novel information, was fully amnestic (i.e., 0% retention) across 2/3 tasks and only exhibited 14% retention across a final memory task after brief delays, and performed poorly across yes/no recognition trials. This suggests evidence for rapid forgetting and an evolving and already quite significant storage impairment, both of which are the hallmark testing patterns for Alzheimer's disease. His recent brain MRI also suggested subjective temporal lobe predominance for ongoing atrophy, which, if this becomes objectively true, is a well-established risk factor for Alzheimer's disease. Further dysfunction surrounding confrontation naming and cognitive flexibility align with expected disease progression. Continued medical monitoring will be important moving forward.   PREVIOUS MEDICATIONS:   CURRENT MEDICATIONS:  Outpatient Encounter Medications as of 01/22/2023  Medication Sig   Cholecalciferol (VITAMIN D3) 50 MCG (2000 UT) TABS Take 50 mcg by mouth once.   dicyclomine  (BENTYL ) 10 MG capsule Take 1 capsule (10 mg total) by mouth 4 (four) times daily as needed for spasms.   glipiZIDE (GLUCOTROL) 5 MG tablet Take 10 mg by mouth once.   losartan -hydrochlorothiazide  (HYZAAR) 50-12.5 MG tablet Take 1 tablet by mouth daily.   METFORMIN  HCL ER, OSM, PO Take 500 mg by mouth in the morning and at bedtime. Am and pm   pantoprazole  (PROTONIX ) 40 MG tablet Take 40 mg by mouth 2 (two) times daily.   rosuvastatin  (CRESTOR ) 5 MG tablet Take 5 mg by mouth daily.   amLODipine  (NORVASC ) 5 MG tablet Take 1 tablet (5 mg total) by mouth daily. (Patient not taking: Reported on 01/22/2023)   pravastatin  (PRAVACHOL ) 20 MG tablet Take 1 tablet (20 mg total) by mouth every evening. (Patient not taking: Reported on 01/22/2023)   No facility-administered encounter medications on file as of 01/22/2023.        No data to display            09/05/2022     8:00 AM 08/13/2022   10:00 AM  Montreal Cognitive Assessment   Visuospatial/ Executive (0/5) 2 1  Naming (0/3) 3 3  Attention: Read list of digits (0/2) 2 2  Attention: Read list of letters (0/1) 1 1  Attention: Serial 7 subtraction starting at 100 (0/3) 0 0  Language: Repeat phrase (0/2) 1 1  Language : Fluency (0/1) 0 0  Abstraction (0/2) 0 0  Delayed Recall (0/5) 0 0  Orientation (0/6) 4 4  Total 13 12  Adjusted Score (based on education) 14 13    Objective:     PHYSICAL EXAMINATION:    VITALS:   Vitals:   01/22/23 1038  BP: (!) 165/79  Pulse: 85  SpO2: 100%  Weight: 136 lb (61.7 kg)  Height: 5' 6.5 (1.689 m)    GEN:  The patient appears stated age and is in NAD. HEENT:  Normocephalic, atraumatic.   Neurological examination:  General: NAD, well-groomed, appears stated age. Orientation: The patient is alert. Oriented to person, place and not to date Cranial nerves: There is good facial symmetry.anxious appearing.The speech is fluent and clear. No aphasia or  dysarthria. Fund of knowledge is reduced. Recent and remote memory are impaired. Attention and concentration are reduced.  Able to name objects and repeat phrases.  Hearing is intact to conversational tone.   Sensation: Sensation is intact to light touch throughout Motor: Strength is at least antigravity x4. DTR's 2/4 in UE/LE     Movement examination: Tone: There is normal tone in the UE/LE Abnormal movements:  no tremor.  No myoclonus.  No asterixis.   Coordination:  There is no decremation with RAM's. Normal finger to nose  Gait and Station: The patient has no difficulty arising out of a deep-seated chair without the use of the hands. The patient's stride length is good.  Gait is cautious and narrow.    Thank you for allowing us  the opportunity to participate in the care of this nice patient. Please do not hesitate to contact us  for any questions or concerns.   Total time spent on today's visit was 30  minutes dedicated to this patient today, preparing to see patient, examining the patient, ordering tests and/or medications and counseling the patient, documenting clinical information in the EHR or other health record, independently interpreting results and communicating results to the patient/family, discussing treatment and goals, answering patient's questions and coordinating care.  Cc:  Campbell Reynolds, NP  Camie Sevin 01/22/2023 11:13 AM

## 2023-02-06 ENCOUNTER — Ambulatory Visit: Payer: Medicare Other | Admitting: Physician Assistant

## 2023-02-13 ENCOUNTER — Ambulatory Visit: Payer: Medicare Other | Admitting: Physician Assistant

## 2023-02-13 ENCOUNTER — Encounter: Payer: Self-pay | Admitting: Physician Assistant

## 2023-02-13 VITALS — BP 120/80 | HR 86 | Ht 66.5 in | Wt 133.5 lb

## 2023-02-13 DIAGNOSIS — R1030 Lower abdominal pain, unspecified: Secondary | ICD-10-CM | POA: Diagnosis not present

## 2023-02-13 DIAGNOSIS — K5909 Other constipation: Secondary | ICD-10-CM

## 2023-02-13 NOTE — Progress Notes (Signed)
I agree with the assessment and plan as outlined by Ms. Lemmon. 

## 2023-02-13 NOTE — Progress Notes (Signed)
Chief Complaint: Lower abdominal pain  HPI:    [name] is a 73 year old male with a past medical history as listed below including esophageal spasm, GERD and multiple others, who presents to clinic today with a complaint of lower abdominal pain.      05/09/22 office visit with Dr. Leonides Schanz for lower abdominal pain.  At that time labs were checked and a CT abdomen pelvis.  Restarted Bentyl 10 mg 3 times daily.  Next colonoscopy noted to be due 05/2024 for polyp surveillance.    05/09/2022 CT showed left-sided colonic diverticulosis and moderate colonic stool burden suggesting constipation as well as hepatic steatosis.  At that time discussed that his constipation could be the reason for his pain.  Recommended starting MiraLAX once a day in addition to regular Ex-Lax.  Discussed increasing to twice daily or eating prunes/prune juice.    08/18/2022 MRI of the lumbar spine showed lumbar spine spondylosis.    Today, patient presents to clinic and tells me that he continues with lower abdominal pain that radiates around into his back.  Tells me that he still uses Ex-Lax 2-3 times a week in order to have a bowel movement.  He does not seem to be able to have a bowel movement without using this medicine.  He did try a couple of doses of MiraLAX last year after seeing Dr. Leonides Schanz but did not do this consistently and certainly did not use it daily.  He noticed no difference with that.  He is unable to tell me if after having a really good complete bowel movement if his abdominal/back pain is better.  He really has not paid attention.  No new symptoms or complaints today.    Denies fever, chills or weight loss.  Past Medical History:  Diagnosis Date   Cervical radiculopathy 11/12/2019   Chest pain 01/13/2011   DDD (degenerative disc disease), cervical 11/12/2019   Diabetes mellitus type 2, noninsulin dependent 01/13/2011   Diverticulosis    Esophageal spasm    Essential hypertension    Fatty liver    GERD  (gastroesophageal reflux disease) 01/13/2011   Headache 03/26/2013   History of cardiac cath    a. 2005: normal cors, normal EF. b. Nuc 12/2010: normal, EF 61%. 2D Echo 12/2010: EF normal (% not given), no RWMA, trivial AI, aortic root trivially dilated, mild MR.   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 disease 11/05/2022   Seasonal allergies    TIA (transient ischemic attack) 02/01/2014   Unintentional weight loss 01/31/2014   Wears glasses     Past Surgical History:  Procedure Laterality Date   CARDIAC CATHETERIZATION  2005   No CAD   CIRCUMCISION N/A 07/23/2018   Procedure: CIRCUMCISION ADULT;  Surgeon: Crista Elliot, MD;  Location: [facility];  Service: Urology;  Laterality: N/A;   COLONOSCOPY     PROSTATECTOMY      Current Outpatient Medications  Medication Sig Dispense Refill   Cholecalciferol (VITAMIN D3) 50 MCG (2000 UT) TABS Take 50 mcg by mouth once.     dicyclomine (BENTYL) 10 MG capsule Take 1 capsule (10 mg total) by mouth 4 (four) times daily as needed  for spasms. 90 capsule 2   donepezil (ARICEPT) 10 MG tablet Take half tablet (5 mg) daily for 2 weeks, then increase to the full tablet at 10 mg daily. 30 tablet 11   glipiZIDE (GLUCOTROL) 5 MG tablet Take 10 mg by mouth once.     losartan-hydrochlorothiazide (HYZAAR) 50-12.5 MG tablet Take 1 tablet by mouth daily. 90 tablet 3   METFORMIN HCL ER, OSM, PO Take 500 mg by mouth in the morning and at bedtime. Am and pm     pantoprazole (PROTONIX) 40 MG tablet Take 40 mg by mouth 2 (two) times daily.     rosuvastatin (CRESTOR) 5 MG tablet Take 5 mg by mouth daily.     No current facility-administered medications for this visit.    Allergies  as of 02/13/2023 - Review Complete 02/13/2023  Allergen Reaction Noted   Iohexol Nausea And Vomiting 04/27/2013   Jardiance [empagliflozin]  02/28/2018   Sucralfate Hives and Rash 01/12/2022    Family History  Problem Relation Age of Onset   Cancer Mother    Colon cancer Father    COPD Sister    Hypertension Sister    Stroke Brother        [place] and hit head, ? stroke   CAD Brother        Bypass in his 13's.   Stomach cancer Neg Hx    Esophageal cancer Neg Hx    Colon polyps Neg Hx     Social History   Socioeconomic History   Marital status: Divorced    Spouse name: Not on file   Number of children: 5   Years of education: 12   Highest education level: High school graduate  Occupational History   Occupation: Retired    Comment: Maintenance/custodial  Tobacco Use   Smoking status: Never   Smokeless tobacco: Never  Vaping Use   Vaping status: Never Used  Substance and Sexual Activity   Alcohol use: No   Drug use: No   Sexual activity: Not on file  Other Topics Concern   Not on file  Social History Narrative   Patient is divorced and lives alone.Patient has five children.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  Patient has a high school education.Patient is right-handed.Patient drinks two cups of tea daily.   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 Strain: Not on [employer] Insecurity: Not on file  Transportation Needs: Not on file  Physical Activity: Not on file  Stress: Not on file  Social Connections: Not on file  Intimate Partner Violence: Not on file    Review of Systems:    Constitutional: No weight loss, fever or chills Cardiovascular: No chest pain Respiratory: No SOB  Gastrointestinal: See HPI and otherwise negative   Physical Exam:  Vital signs: BP 120/80 (BP Location: Left Arm, Patient Position: Sitting, Cuff Size: Normal)    Pulse 86   Ht 5' 6.5" (1.689 m)   Wt 133 lb 8 oz (60.6 kg)   SpO2 98%   BMI 21.22 kg/m   Constitutional:   Pleasant AA male appears to be in NAD, Well developed, Well nourished, alert and cooperative Respiratory: Respirations even and unlabored. Lungs clear to auscultation bilaterally.   No wheezes, crackles, or rhonchi.  Cardiovascular: Normal S1, S2. No MRG. Regular rate and rhythm. No peripheral edema,  cyanosis or pallor.  Gastrointestinal:  Soft, nondistended, nontender. No rebound or guarding.  Decreased bowel sounds all 4 quadrants. No appreciable masses or hepatomegaly. Rectal:  Not performed.  Psychiatric: Oriented to person, place and time. Demonstrates good judgement and reason without abnormal affect or behaviors.  RELEVANT LABS AND IMAGING: CBC    Component Value Date/Time   WBC 5.6 05/09/2022 1136   RBC 4.81 05/09/2022 1136   HGB 15.5 05/09/2022 1136   HCT 45.2 05/09/2022 1136   PLT 227.0 05/09/2022 1136   MCV 94.0 05/09/2022 1136   MCH 31.9 02/23/2017 0609   MCHC 34.2 05/09/2022 1136   RDW 14.2 05/09/2022 1136   LYMPHSABS 1.9 05/09/2022 1136   MONOABS 0.4 05/09/2022 1136   EOSABS 0.0 05/09/2022 1136   BASOSABS 0.0 05/09/2022 1136    CMP     Component Value Date/Time   NA 135 05/09/2022 1136   K 4.1 05/09/2022 1136   CL 101 05/09/2022 1136   CO2 27 05/09/2022 1136   GLUCOSE 150 (H) 05/09/2022 1136   BUN 12 05/09/2022 1136   CREATININE 1.00 05/09/2022 1136   CALCIUM 9.1 05/09/2022 1136   PROT 7.1 05/09/2022 1136   ALBUMIN 4.2 05/09/2022 1136   AST 25 05/09/2022 1136   ALT 35 05/09/2022 1136   ALKPHOS 80 05/09/2022 1136   BILITOT 0.8 05/09/2022 1136   GFRNONAA >60 02/23/2017 0609   GFRAA >60 02/23/2017 1610    Assessment: 1.  Lower abdominal pain in the setting of chronic constipation: Workup at last visit included a CT the abdomen pelvis which showed a large amount of stool in the colon, patient instructed to start MiraLAX in addition to Senokot but  never did this on a regular basis, also recent MRI of the spine with some spondylosis; likely continued constipation  Plan: 1.  Again instructed the patient today to start MiraLAX on a daily basis.  He will do this daily and also continue to use his Senokot as usual if needed.  If this does not help after the first week he will increase to twice daily dosing.  This is clearly written out for him. 2.  If after doing the above and having good bowel movements for the next few weeks he does not feel any decrease in lower abdominal pain then will need to workup further. 3.  Discussed Dicyclomine, this was given to at last visit, he is not sure that it really helps.  Discussed that this can also worsen constipation so he opted to not use this currently. 4.  Patient to follow in clinic with me in the next 3 months for follow-up.  He will contact me in between to let me know how he is doing.  Hyacinth Meeker, PA-C [facility] 02/13/2023, 10:18 AM  Cc: [name], NP

## 2023-02-13 NOTE — Patient Instructions (Addendum)
Miralax - once daily for a week , twice daily if no change in bowel habits.  Give [place] a call in 2-3 weeks to update [place] on how you're doing.    We have you scheduled for a follow up on 05/21/2023 at 10:20 am with [place]  _______________________________________________________  If your blood pressure at your visit was 140/90 or greater, please contact your primary care physician to follow up on this.  _______________________________________________________  If you are age 6 or older, your body mass index should be between 23-30. Your Body mass index is 21.22 kg/m. If this is out of the aforementioned range listed, please consider follow up with your Primary Care Provider.  If you are age 89 or younger, your body mass index should be between 19-25. Your Body mass index is 21.22 kg/m. If this is out of the aformentioned range listed, please consider follow up with your Primary Care Provider.   ________________________________________________________  The [place] [facility] providers would like to encourage you to use [facility] to communicate with providers for non-urgent requests or questions.  Due to long hold times on the telephone, sending your provider a message by [facility] may be a faster and more efficient way to get a response.  Please allow 48 business hours for a response.  Please remember that this is for non-urgent requests.  _______________________________________________________ It was a pleasure to see you today!  Thank you for trusting me with your gastrointestinal care!

## 2023-04-25 ENCOUNTER — Telehealth: Payer: Self-pay | Admitting: Physician Assistant

## 2023-04-25 NOTE — Telephone Encounter (Signed)
 Inbound call from patient's daughter stating patient will keep scheduled appointment.

## 2023-04-25 NOTE — Telephone Encounter (Signed)
 Inbound call from patient stating that he has an appointment on 4/29 at 10:20 and is requesting a call back from nurse to discuss if there is anyway he can be seen sooner. Patient states he is having a lot of abdominal pain and does not think he can wait that long. Please advise.

## 2023-04-25 NOTE — Telephone Encounter (Signed)
 Dr Leonides Schanz patient last seen by Hyacinth Meeker, PA in January. No sooner openings. Called the patient to discuss. No answer. Left a message of my call.

## 2023-05-06 ENCOUNTER — Encounter: Payer: Self-pay | Admitting: Cardiovascular Disease

## 2023-05-06 NOTE — Progress Notes (Unsigned)
 Cardiology Office Note:    Date:  05/07/2023   ID:  [name], DOB 1951-01-08, MRN [id number]  PCP:  [name], NP  Cardiologist:  Kristeen Miss, MD   Referring MD: [name], NP   Problem list 1.  Chest pain 2.  Hypertension 3.  Diabetes mellitus 4.  Hyperlipidemia  Chief Complaint  Patient presents with   Hypertension         History of Present Illness:    [name] is a 73 y.o. male with a hx of hypertension, diabetes mellitus, hyperlipidemia who is referred from the emergency room for further evaluation of some episodes of chest discomfort.  [name] is a 73 yo who has had chest pain for years. Feels like a pressure like sensation  Seems to be left sided,  Radiates to his back  Also has some left arm numbness - but this is not related to the CP  Does not get any regular exercise  Not related to walking up stairs or bringing in the groceries Pains might last as long as a day or so  Not associated with dyspnea or diaphoresis  Past stress myoivew in 2008 with Dr. Reyes Ivan was normal  Over the past week or so, he has been eating lots of [place] fish  ( [employer])  [place], Does not like [place]  Works in Maintenance - typically at private schools    August 04, 2021 [name] is seen for follow up of his CP.  I last saw him 4 years ago. We performed a myoview which was low risk.   He was to come back as needed.  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 No CP , no dyspnea  [place]es to [employer] [place] , [place] , had a great visit   May 07, 2023: Seen with daughter, [name]  P.o. is seen for follow-up of his chest discomfort.  His chest discomfort resolved with PPI therapy. Has retired from the school system  Walks every day   Past Medical History:  Diagnosis Date   Cervical radiculopathy 11/12/2019   Chest pain 01/13/2011   DDD (degenerative  disc disease), cervical 11/12/2019   Diabetes mellitus type 2, noninsulin dependent 01/13/2011   Diverticulosis    Esophageal spasm    Essential hypertension    Fatty liver    GERD (gastroesophageal reflux disease) 01/13/2011   Headache 03/26/2013   History of cardiac cath    a. 2005: normal cors, normal EF. b. Nuc 12/2010: normal, EF 61%. 2D Echo 12/2010: EF normal (% not given), no RWMA, trivial AI, aortic root trivially dilated, mild MR.   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 disease 11/05/2022   Seasonal allergies    TIA (transient ischemic attack) 02/01/2014   Unintentional weight loss 01/31/2014   Wears glasses     Past Surgical History:  Procedure Laterality Date   CARDIAC CATHETERIZATION  2005   No CAD   CIRCUMCISION N/A 07/23/2018   Procedure: CIRCUMCISION ADULT;  Surgeon: Crista Elliot, MD;  Location[facility];  Service: Urology;  Laterality: N/A;   COLONOSCOPY     PROSTATECTOMY      Current Medications: Current Meds  Medication Sig   Cholecalciferol (VITAMIN D3) 50 MCG (2000 UT) TABS Take 50 mcg by mouth once.   dicyclomine (BENTYL) 10 MG capsule Take 1 capsule (10 mg total) by mouth 4 (four) times daily as needed for spasms.   donepezil (ARICEPT) 10 MG tablet Take half tablet (5 mg) daily for 2 weeks, then increase to the full tablet at 10 mg daily.   glipiZIDE (GLUCOTROL) 5 MG tablet Take 10 mg by mouth once.   losartan-hydrochlorothiazide (HYZAAR) 50-12.5 MG tablet Take 1 tablet by mouth daily.   METFORMIN HCL ER, OSM, PO Take 500 mg by mouth in the morning and at bedtime. Am and pm   pantoprazole (PROTONIX) 40 MG tablet Take 40 mg by mouth 2 (two) times daily.   rosuvastatin  (CRESTOR) 5 MG tablet Take 5 mg by mouth daily.     Allergies:   Iohexol, Jardiance [empagliflozin], and Sucralfate   Social History   Socioeconomic History   Marital status: Divorced    Spouse name: Not on file   Number of children: 5   Years of education: 12   Highest education level: High school graduate  Occupational History   Occupation: Retired    Comment: Maintenance/custodial  Tobacco Use   Smoking status: Never   Smokeless tobacco: Never  Vaping Use   Vaping status: Never Used  Substance and Sexual Activity   Alcohol use: No   Drug use: No   Sexual activity: Not on file  Other Topics Concern   Not on file  Social History Narrative   Patient is divorced and lives alone.Patient has five children.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  Patient has a high school education.Patient is right-handed.Patient drinks two cups of tea daily.   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 Strain: Not on [employer] Insecurity: Not on file  Transportation Needs: Not on file  Physical Activity: Not on file  Stress: Not on file  Social Connections: Not on file     Family History: The patient's family history includes CAD in his brother; COPD in his sister; Cancer in his mother; Colon cancer in his father; Hypertension in his sister; Stroke in his brother. There is no history of Stomach cancer, Esophageal cancer, or Colon polyps.  ROS:   Please see the history of present illness.     All other systems reviewed and are negative.  EKGs/Labs/Other Studies Reviewed:    The following studies were reviewed today:     Recent Labs: 05/09/2022: ALT 35; BUN 12; Creatinine, Ser 1.00; Hemoglobin 15.5; Platelets 227.0; Potassium 4.1; Sodium 135 08/13/2022: TSH 1.29  Recent Lipid Panel    Component Value Date/Time   CHOL 133 08/04/2021 0903   TRIG 150 (H) 08/04/2021  0903   HDL 29 (L) 08/04/2021 0903   CHOLHDL 4.6 08/04/2021 0903   CHOLHDL 4.6 01/31/2014 0138   VLDL 17 01/31/2014 0138   LDLCALC 78 08/04/2021 0903    Physical Exam:     Physical Exam: Blood pressure 134/68, pulse 72, height 5\' 7"  (1.702 m), weight 136 lb 12.8 oz (62.1 kg), SpO2 97%.       GEN:  Well nourished, well developed in no acute distress HEENT:  Normal NECK: No JVD; No carotid bruits LYMPHATICS: No lymphadenopathy CARDIAC: RRR , no murmurs, rubs, gallops RESPIRATORY:  Clear to auscultation without rales, wheezing or rhonchi  ABDOMEN: Soft, non-tender, non-distended MUSCULOSKELETAL:  No edema; No deformity  SKIN: Warm and dry NEUROLOGIC:  Alert and oriented x 3  EKG:      EKG Interpretation Date/Time:  Tuesday May 07 2023 08:17:35 EDT Ventricular Rate:  71 PR Interval:  184 QRS Duration:  80 QT Interval:  394 QTC Calculation: 428 R Axis:   62  Text Interpretation: Normal sinus rhythm Normal ECG When compared with ECG of 15-Mar-2017 13:04, No significant change was found Confirmed by Ahmad Alert ([id number]) on 05/07/2023 8:25:45 AM    ASSESSMENT:    1. Essential hypertension   2. Hyperlipidemia, unspecified hyperlipidemia type     PLAN:      Chest discomfort:   no cp 2.  Hyperlipidemia :   labs were drawn at [facility] yesterday We will call and have them faxed over      Medication Adjustments/Labs and Tests Ordered: Current medicines are reviewed at length with the patient today.  Concerns regarding medicines are outlined above.  Orders Placed This Encounter  Procedures   EKG 12-Lead   No orders of the defined types were placed in this encounter.    Signed, Ahmad Alert, MD  05/07/2023 2:52 PM    [facility]

## 2023-05-07 ENCOUNTER — Encounter: Payer: Self-pay | Admitting: Cardiovascular Disease

## 2023-05-07 ENCOUNTER — Ambulatory Visit: Attending: Cardiovascular Disease | Admitting: Cardiovascular Disease

## 2023-05-07 VITALS — BP 134/68 | HR 72 | Ht 67.0 in | Wt 136.8 lb

## 2023-05-07 DIAGNOSIS — E785 Hyperlipidemia, unspecified: Secondary | ICD-10-CM | POA: Diagnosis not present

## 2023-05-07 DIAGNOSIS — I10 Essential (primary) hypertension: Secondary | ICD-10-CM

## 2023-05-07 LAB — LAB REPORT - SCANNED
Albumin, Urine POC: 8.1
Calcium: 9.2
Creatinine, POC: 78 mg/dL
EGFR: 69
Microalb Creat Ratio: 104
PSA, Total: <0.04
PTH: 89

## 2023-05-07 NOTE — Patient Instructions (Signed)
 Medication Instructions:  Your physician recommends that you continue on your current medications as directed. Please refer to the Current Medication list given to you today.  *If you need a refill on your cardiac medications before your next appointment, please call your pharmacy*  Follow-Up: At [facility], you and your health needs are our priority.  As part of our continuing mission to provide you with exceptional heart care, we have created designated Provider Care Teams.  These Care Teams include your primary Cardiologist (physician) and Advanced Practice Providers (APPs -  Physician Assistants and Nurse Practitioners) who all work together to provide you with the care you need, when you need it.  Your next appointment:   1 year(s)  The format for your next appointment:   In Person  Provider:   Kristeen Miss, MD {  Other Instructions   1st Floor: - Lobby - Registration  - Pharmacy  - Lab - Cafe  2nd Floor: - PV Lab - Diagnostic Testing (echo, CT, nuclear med)  3rd Floor: - Vacant  4th Floor: - TCTS (cardiothoracic surgery) - AFib Clinic - Structural Heart Clinic - Vascular Surgery  - Vascular Ultrasound  5th Floor: - HeartCare Cardiology (general and EP) - Clinical Pharmacy for coumadin, hypertension, lipid, weight-loss medications, and med management appointments    Valet parking services will be available as well.

## 2023-05-17 ENCOUNTER — Encounter: Payer: Self-pay | Admitting: Physician Assistant

## 2023-05-17 ENCOUNTER — Institutional Professional Consult (permissible substitution): Payer: Medicare Other | Admitting: Psychology

## 2023-05-17 ENCOUNTER — Ambulatory Visit: Payer: Self-pay

## 2023-05-21 ENCOUNTER — Other Ambulatory Visit

## 2023-05-21 ENCOUNTER — Ambulatory Visit (INDEPENDENT_AMBULATORY_CARE_PROVIDER_SITE_OTHER)
Admission: RE | Admit: 2023-05-21 | Discharge: 2023-05-21 | Disposition: A | Discharge status: Home / Self Care | Source: Ambulatory Visit | Attending: Physician Assistant | Admitting: Physician Assistant

## 2023-05-21 ENCOUNTER — Ambulatory Visit: Payer: Medicare Other | Admitting: Physician Assistant

## 2023-05-21 ENCOUNTER — Other Ambulatory Visit: Payer: Self-pay | Admitting: Internal Medicine

## 2023-05-21 ENCOUNTER — Encounter: Payer: Self-pay | Admitting: Physician Assistant

## 2023-05-21 VITALS — BP 118/58 | HR 70 | Ht 67.0 in | Wt 137.0 lb

## 2023-05-21 DIAGNOSIS — R1030 Lower abdominal pain, unspecified: Secondary | ICD-10-CM | POA: Diagnosis not present

## 2023-05-21 DIAGNOSIS — K5909 Other constipation: Secondary | ICD-10-CM

## 2023-05-21 DIAGNOSIS — K5900 Constipation, unspecified: Secondary | ICD-10-CM

## 2023-05-21 NOTE — Patient Instructions (Signed)
 Your provider has requested that you have an abdominal x ray before leaving today. Please go to the basement floor to our Radiology department for the test.  _______________________________________________________  If your blood pressure at your visit was 140/90 or greater, please contact your primary care physician to follow up on this.  _______________________________________________________  If you are age 75 or older, your body mass index should be between 23-30. Your Body mass index is 21.46 kg/m. If this is out of the aforementioned range listed, please consider follow up with your Primary Care Provider.  If you are age 32 or younger, your body mass index should be between 19-25. Your Body mass index is 21.46 kg/m. If this is out of the aformentioned range listed, please consider follow up with your Primary Care Provider.   ________________________________________________________  The [place] [facility] providers would like to encourage you to use MYCHART to communicate with providers for non-urgent requests or questions.  Due to long hold times on the telephone, sending your provider a message by [facility] may be a faster and more efficient way to get a response.  Please allow 48 business hours for a response.  Please remember that this is for non-urgent requests.  _______________________________________________________

## 2023-05-21 NOTE — Progress Notes (Signed)
 Chief Complaint: Follow-up chronic constipation  HPI:    [name] is a 73 year old African-American male with a past medical history as listed below including esophageal spasm, GERD and multiple others, known to Dr. Rosaline Coma, who returns to clinic today for follow-up of lower abdominal pain and chronic constipation.    05/09/22 office visit with Dr. Rosaline Coma for lower abdominal pain.  At that time labs were checked and a CT abdomen pelvis.  Restarted Bentyl  10 mg 3 times daily.  Next colonoscopy noted to be due 05/2024 for polyp surveillance.    05/09/2022 CT showed left-sided colonic diverticulosis and moderate colonic stool burden suggesting constipation as well as hepatic steatosis.  At that time discussed that his constipation could be the reason for his pain.  Recommended starting MiraLAX once a day in addition to regular Ex-Lax.  Discussed increasing to twice daily or eating prunes/prune juice.    08/18/2022 MRI of the lumbar spine showed lumbar spine spondylosis.    02/13/2023 patient described lower abdominal pain that radiated to his back.  Still on Ex-Lax 2-3 times a week in order to have a bowel movement.  At that time discussed previous CT that showed a large amount of stool in the colon.  At that visit recommended to start MiraLAX on a daily basis and continue to use his Senokot as usual.  Discussed increasing to twice daily dosing of MiraLAX if no improvement.    Today, the patient presents to clinic accompanied by his daughter who also has some questions.  Apparently he has not really changed anything since we met last still using his Ex-Lax 2-3 times a week and has not added in daily MiraLAX.  He tells me he has a bowel movement every day though at some point.  Continues with lower abdominal pain.  He does not think this is any better after he has a bowel movement.    Nuys fever, chills or weight loss.  Past Medical History:  Diagnosis Date   Cervical radiculopathy 11/12/2019   Chest pain  01/13/2011   DDD (degenerative disc disease), cervical 11/12/2019   Diabetes mellitus type 2, noninsulin dependent 01/13/2011   Diverticulosis    Esophageal spasm    Essential hypertension    Fatty liver    GERD (gastroesophageal reflux disease) 01/13/2011   Headache 03/26/2013   History of cardiac cath    a. 2005: normal cors, normal EF. b. Nuc 12/2010: normal, EF 61%. 2D Echo 12/2010: EF normal (% not given), no RWMA, trivial AI, aortic root trivially dilated, mild MR.   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     dicyclomine  (BENTYL ) 10 MG capsule TAKE 1 CAPSULE (10 MG TOTAL) BY MOUTH 4 (FOUR) TIMES DAILY AS NEEDED FOR SPASMS. 90 capsule 2   donepezil  (ARICEPT ) 10 MG tablet Take half tablet (5 mg) daily for 2 weeks, then increase to the full tablet at 10 mg daily. 30 tablet 11   glipiZIDE (GLUCOTROL) 5 MG tablet Take 10 mg by mouth once.     losartan -hydrochlorothiazide  (HYZAAR) 50-12.5 MG tablet Take 1 tablet by mouth daily. 90 tablet 3   METFORMIN  HCL ER, OSM, PO Take 500 mg by mouth in the morning and at bedtime. Am and pm      pantoprazole  (PROTONIX ) 40 MG tablet Take 40 mg by mouth 2 (two) times daily.     rosuvastatin  (CRESTOR ) 5 MG tablet Take 5 mg by mouth daily.     No current facility-administered medications for this visit.    Allergies as of 05/21/2023 - Review Complete 05/21/2023  Allergen Reaction Noted   Iohexol  Nausea And Vomiting 04/27/2013   Jardiance [empagliflozin]  02/28/2018   Sucralfate  Hives and Rash 01/12/2022    Family History  Problem Relation Age of Onset   Cancer Mother    Colon cancer Father    COPD Sister    Hypertension Sister    Stroke Brother        [place] and hit head, ? stroke   CAD Brother        Bypass in his 76's.   Stomach cancer Neg Hx    Esophageal cancer Neg Hx    Colon polyps Neg Hx     Social History   Socioeconomic History   Marital status: Divorced    Spouse name: Not on file   Number of children: 5   Years of education: 12   Highest education level: High school graduate  Occupational History   Occupation: Retired    Comment: Maintenance/custodial  Tobacco Use   Smoking status: Never   Smokeless tobacco: Never  Vaping Use   Vaping status: Never Used  Substance and Sexual Activity   Alcohol use: No   Drug use: No   Sexual activity: Not on file  Other Topics Concern   Not on file  Social History Narrative   Patient is divorced and lives alone.Patient has five children.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  Patient has a high school education.Patient is right-handed.Patient drinks two cups of tea daily.   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 Strain: Not on [employer] Insecurity: Not on file  Transportation Needs: Not on file  Physical Activity: Not on file  Stress: Not on file  Social Connections: Not on file  Intimate Partner Violence: Not on file    Review of Systems:    Constitutional: No weight loss, fever or  chills Cardiovascular: No chest pain Respiratory: No SOB  Gastrointestinal: See HPI and otherwise negative   Physical Exam:  Vital signs: BP (!) 118/58   Pulse 70   Ht 5\' 7"  (1.702 m)   Wt 137 lb (62.1 kg)   BMI 21.46 kg/m    Constitutional:   Pleasant AA male appears to be in NAD, Well developed, Well nourished, alert and cooperative Respiratory: Respirations even and unlabored. Lungs clear to auscultation bilaterally.   No  wheezes, crackles, or rhonchi.  Cardiovascular: Normal S1, S2. No MRG. Regular rate and rhythm. No peripheral edema, cyanosis or pallor.  Gastrointestinal:  Soft, nondistended, nontender. No rebound or guarding. Normal bowel sounds. No appreciable masses or hepatomegaly. Rectal:  Not performed.  Psychiatric: Demonstrates good judgement and reason without abnormal affect or behaviors.  RELEVANT LABS AND IMAGING: CBC    Component Value Date/Time   WBC 5.6 05/09/2022 1136   RBC 4.81 05/09/2022 1136   HGB 15.5 05/09/2022 1136   HCT 45.2 05/09/2022 1136   PLT 227.0 05/09/2022 1136   MCV 94.0 05/09/2022 1136   MCH 31.9 02/23/2017 0609   MCHC 34.2 05/09/2022 1136   RDW 14.2 05/09/2022 1136   LYMPHSABS 1.9 05/09/2022 1136   MONOABS 0.4 05/09/2022 1136   EOSABS 0.0 05/09/2022 1136   BASOSABS 0.0 05/09/2022 1136    CMP     Component Value Date/Time   NA 135 05/09/2022 1136   K 4.1 05/09/2022 1136   CL 101 05/09/2022 1136   CO2 27 05/09/2022 1136   GLUCOSE 150 (H) 05/09/2022 1136   BUN 12 05/09/2022 1136   CREATININE 1.00 05/09/2022 1136   CALCIUM  9.2 05/07/2023 1052   PROT 7.1 05/09/2022 1136   ALBUMIN 4.2 05/09/2022 1136   AST 25 05/09/2022 1136   ALT 35 05/09/2022 1136   ALKPHOS 80 05/09/2022 1136   BILITOT 0.8 05/09/2022 1136   GFRNONAA >60 02/23/2017 0609   GFRAA >60 02/23/2017 1610    Assessment: 1.  Chronic constipation and lower abdominal pain: Patient has not tried any of the initiatives we set out at last visit and continues with lower  abdominal pain; suspect this is still due to constipation  Plan: 1.  Answered the patient and daughter's questions today. 2.  Ordered a two-view abdominal x-ray today, pending results patient may need to increase his laxatives as we discussed at last visit.  If this does not show a large amount of stool then could consider moving his colonoscopy up given ongoing pain and a change in bowel habits. 3.  Patient to follow in clinic per recommendations after x-ray.  Reginal Capra, PA-C [facility] 05/21/2023, 10:32 AM  Cc: Maryellen Snare, NP

## 2023-05-22 NOTE — Progress Notes (Signed)
 I agree with the assessment and plan as outlined by Ms. Lemmon.

## 2023-05-24 ENCOUNTER — Encounter: Payer: Medicare Other | Admitting: Psychology

## 2023-07-19 ENCOUNTER — Encounter: Payer: Self-pay | Admitting: Physician Assistant

## 2023-07-19 ENCOUNTER — Ambulatory Visit: Admitting: Physician Assistant

## 2023-07-19 VITALS — BP 122/71 | HR 72 | Ht 67.0 in | Wt 133.0 lb

## 2023-07-19 DIAGNOSIS — F0280 Dementia in other diseases classified elsewhere without behavioral disturbance: Secondary | ICD-10-CM

## 2023-07-19 DIAGNOSIS — G309 Alzheimer's disease, unspecified: Secondary | ICD-10-CM | POA: Diagnosis not present

## 2023-07-19 NOTE — Patient Instructions (Addendum)
 It was a pleasure to see you today at our office.   Recommendations:  Repeat Neurocognitive evaluation at our office   Continue  Donepezil  10 mg  daily.   Follow up in 6  months   For psychiatric meds, mood meds: Please have your primary care physician manage these medications.  If you have any severe symptoms of a stroke, or other severe issues such as confusion,severe chills or fever, etc call 911 or go to the ER as you may need to be evaluated further  For guidance regarding [facility] and if placement were needed at the facility, contact Social Worker tel: [phone]  Consider [facility]  [address], [place] [address] [phone]  Hours of Operation Mondays to Thursdays: 8 am to 8 pm,Fridays: 9 am to 8 pm, Saturdays: 9 am to 1 pm Sundays: Closed  [email or web address]   For assessment of decision of mental capacity and competency:  Call Dr. Rosaline Nine, geriatric psychiatrist at [phone]  Counseling regarding caregiver distress, including caregiver depression, anxiety and issues regarding community resources, adult day care programs, adult living facilities, or memory care questions:  please contact your  Primary Doctor's Social Worker   Whom to call: Memory  decline, memory medications: Call our office [phone]    [email or web address]   RECOMMENDATIONS FOR ALL PATIENTS WITH MEMORY PROBLEMS: 1. Continue to exercise (Recommend 30 minutes of walking everyday, or 3 hours every week) 2. Increase social interactions - continue going to [place] and enjoy social gatherings with friends and family 3. Eat healthy, avoid fried foods and eat more fruits and vegetables 4. Maintain adequate blood pressure, blood sugar, and blood cholesterol level. Reducing the risk of stroke and cardiovascular disease  also helps promoting better memory. 5. Avoid stressful situations. Live a simple life and avoid aggravations. Organize your time and prepare for the next day in anticipation. 6. Sleep well, avoid any interruptions of sleep and avoid any distractions in the bedroom that may interfere with adequate sleep quality 7. Avoid sugar, avoid sweets as there is a strong link between excessive sugar intake, diabetes, and cognitive impairment We discussed the Mediterranean diet, which has been shown to help patients reduce the risk of progressive memory disorders and reduces cardiovascular risk. This includes eating fish, eat fruits and green leafy vegetables, nuts like almonds and hazelnuts, walnuts, and also use olive oil. Avoid fast foods and fried foods as much as possible. Avoid sweets and sugar as sugar use has been linked to worsening of memory function.  There is always a concern of gradual progression of memory problems. If this is the case, then we may need to adjust level of care according to patient needs. Support, both to the patient and caregiver, should then be put into place.      You have been referred for a neuropsychological evaluation (i.e., evaluation of memory and thinking abilities). Please bring someone with you to this appointment if possible, as it is helpful for the doctor to hear from both you and another adult who knows you well. Please bring eyeglasses and hearing aids if you wear them.    The evaluation will take approximately 3 hours and has two parts:   The first part is a clinical interview with the neuropsychologist (Dr. Richie or Dr. Jackquline). During the interview, the neuropsychologist will speak with you and the individual you brought to the appointment.    The second part of the evaluation is testing with the doctor's technician (  Lonell or Luke). During the testing, the technician will ask you to remember different types of material, solve problems, and answer some  questionnaires. Your family member will not be present for this portion of the evaluation.   Please note: We must reserve several hours of the neuropsychologist's time and the psychometrician's time for your evaluation appointment. As such, there is a No-Show fee of $100. If you are unable to attend any of your appointments, please contact our office as soon as possible to reschedule.      DRIVING: Regarding driving, in patients with progressive memory problems, driving will be impaired. We advise to have someone else do the driving if trouble finding directions or if minor accidents are reported. Independent driving assessment is available to determine safety of driving.   If you are interested in the driving assessment, you can contact the following:  [facility] in [place] [phone]  [facility] [phone]  [facility] [phone]  [facility] [phone] or [phone]   FALL PRECAUTIONS: Be cautious when walking. Scan the area for obstacles that may increase the risk of trips and falls. When getting up in the mornings, sit up at the edge of the bed for a few minutes before getting out of bed. Consider elevating the bed at the head end to avoid drop of blood pressure when getting up. Walk always in a well-lit room (use night lights in the walls). Avoid area rugs or power cords from appliances in the middle of the walkways. Use a walker or a cane if necessary and consider physical therapy for balance exercise. Get your eyesight checked regularly.  FINANCIAL OVERSIGHT: Supervision, especially oversight when making financial decisions or transactions is also recommended.  HOME SAFETY: Consider the safety of the kitchen when operating appliances like stoves, microwave oven, and blender. Consider having supervision and share cooking responsibilities until no longer able to participate in those. Accidents with firearms and other hazards  in the house should be identified and addressed as well.   ABILITY TO BE LEFT ALONE: If patient is unable to contact 911 operator, consider using LifeLine, or when the need is there, arrange for someone to stay with patients. Smoking is a fire hazard, consider supervision or cessation. Risk of wandering should be assessed by caregiver and if detected at any point, supervision and safe proof recommendations should be instituted.  MEDICATION SUPERVISION: Inability to self-administer medication needs to be constantly addressed. Implement a mechanism to ensure safe administration of the medications.      Mediterranean Diet A Mediterranean diet refers to food and lifestyle choices that are based on the traditions of countries located on the [place]. This way of eating has been shown to help prevent certain conditions and improve outcomes for people who have chronic diseases, like kidney disease and heart disease. What are tips for following this plan? Lifestyle  Cook and eat meals together with your family, when possible. Drink enough fluid to keep your urine clear or pale yellow. Be physically active every day. This includes: Aerobic exercise like running or swimming. Leisure activities like gardening, walking, or housework. Get 7-8 hours of sleep each night. If recommended by your health care provider, drink red wine in moderation. This means 1 glass a day for nonpregnant women and 2 glasses a day for men. A glass of wine equals 5 oz (150 mL). Reading food labels  Check the serving size of packaged foods. For foods such as rice and pasta, the serving  size refers to the amount of cooked product, not dry. Check the total fat in packaged foods. Avoid foods that have saturated fat or trans fats. Check the ingredients list for added sugars, such as corn syrup. Shopping  At the grocery store, buy most of your food from the areas near the walls of the store. This includes: Fresh fruits  and vegetables (produce). Grains, beans, nuts, and seeds. Some of these may be available in unpackaged forms or large amounts (in bulk). Fresh seafood. Poultry and eggs. Low-fat dairy products. Buy whole ingredients instead of prepackaged foods. Buy fresh fruits and vegetables in-season from local farmers markets. Buy frozen fruits and vegetables in resealable bags. If you do not have access to quality fresh seafood, buy precooked frozen shrimp or canned fish, such as tuna, salmon, or sardines. Buy small amounts of raw or cooked vegetables, salads, or olives from the deli or salad bar at your store. Stock your pantry so you always have certain foods on hand, such as olive oil, canned tuna, canned tomatoes, rice, pasta, and beans. Cooking  Cook foods with extra-virgin olive oil instead of using butter or other vegetable oils. Have meat as a side dish, and have vegetables or grains as your main dish. This means having meat in small portions or adding small amounts of meat to foods like pasta or stew. Use beans or vegetables instead of meat in common dishes like chili or lasagna. Experiment with different cooking methods. Try roasting or broiling vegetables instead of steaming or sauteing them. Add frozen vegetables to soups, stews, pasta, or rice. Add nuts or seeds for added healthy fat at each meal. You can add these to yogurt, salads, or vegetable dishes. Marinate fish or vegetables using olive oil, lemon juice, garlic, and fresh herbs. Meal planning  Plan to eat 1 vegetarian meal one day each week. Try to work up to 2 vegetarian meals, if possible. Eat seafood 2 or more times a week. Have healthy snacks readily available, such as: Vegetable sticks with hummus. Greek yogurt. Fruit and nut trail mix. Eat balanced meals throughout the week. This includes: Fruit: 2-3 servings a day Vegetables: 4-5 servings a day Low-fat dairy: 2 servings a day Fish, poultry, or lean meat: 1 serving a  day Beans and legumes: 2 or more servings a week Nuts and seeds: 1-2 servings a day Whole grains: 6-8 servings a day Extra-virgin olive oil: 3-4 servings a day Limit red meat and sweets to only a few servings a month What are my food choices? Mediterranean diet Recommended Grains: Whole-grain pasta. Brown rice. Bulgar wheat. Polenta. Couscous. Whole-wheat bread. [place] Madeira. Vegetables: Artichokes. Beets. Broccoli. Cabbage. Carrots. Eggplant. Green beans. Chard. Kale. Spinach. Onions. Leeks. Peas. Squash. Tomatoes. Peppers. Radishes. Fruits: Apples. Apricots. Avocado. Berries. Bananas. Cherries. Dates. Figs. Grapes. Lemons. Melon. Oranges. Peaches. Plums. Pomegranate. Meats and other protein foods: Beans. Almonds. Sunflower seeds. Pine nuts. Peanuts. Cod. Salmon. Scallops. Shrimp. Tuna. Tilapia. Clams. Oysters. Eggs. Dairy: Low-fat milk. Cheese. Greek yogurt. Beverages: Water. Red wine. Herbal tea. Fats and oils: Extra virgin olive oil. Avocado oil. Grape seed oil. Sweets and desserts: [place] yogurt with honey. Baked apples. Poached pears. Trail mix. Seasoning and other foods: Basil. Cilantro. Coriander. Cumin. Mint. Parsley. Sage. Rosemary. Tarragon. Garlic. Oregano. Thyme. Pepper. Balsalmic vinegar. Tahini. Hummus. Tomato sauce. Olives. Mushrooms. Limit these Grains: Prepackaged pasta or rice dishes. Prepackaged cereal with added sugar. Vegetables: Deep fried potatoes (french fries). Fruits: Fruit canned in syrup. Meats and other protein foods: Beef. Pork.  [place] Hand with skin. Hot dogs. [place]. Dairy: Ice cream. Sour cream. Whole milk. Beverages: Juice. Sugar-sweetened soft drinks. Beer. Liquor and spirits. Fats and oils: Butter. Canola oil. Vegetable oil. Beef fat (tallow). Lard. Sweets and desserts: Cookies. Cakes. Pies. Candy. Seasoning and other foods: Mayonnaise. Premade sauces and marinades. The items listed may not be a complete list. Talk with your dietitian about what  dietary choices are right for you. Summary The Mediterranean diet includes both food and lifestyle choices. Eat a variety of fresh fruits and vegetables, beans, nuts, seeds, and whole grains. Limit the amount of red meat and sweets that you eat. Talk with your health care provider about whether it is safe for you to drink red wine in moderation. This means 1 glass a day for nonpregnant women and 2 glasses a day for men. A glass of wine equals 5 oz (150 mL). This information is not intended to replace advice given to you by your health care provider. Make sure you discuss any questions you have with your health care provider. Document Released: 09/01/2015 Document Revised: 10/04/2015 Document Reviewed: 09/01/2015 Elsevier Interactive Patient Education  2017 [employer].   [facility] [phone] Labs today [address]

## 2023-07-19 NOTE — Progress Notes (Signed)
 Assessment/Plan:   Mild dementia likely due to Alzheimer's disease***  [name] is a very pleasant 73 y.o. 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 Patient is currently on donepezil  10 mg daily. Memory is stable, MMSE today at  He is still able to participate in some ADLs and to drive with close monitoring***.      Follow up in   months. Continue*** Recommend good control of her cardiovascular risk factors Continue to control mood as per PCP Monitor driving     Subjective:    This patient is accompanied in the office by his daughter who supplements the history.  Previous records as well as any outside records available were reviewed prior to todays visit. Patient was last seen on 01/22/2023   Any changes in memory since last visit? About the same.  She has more difficulty with short-term memory such as conversations and names of people.  There is also more confabulation.  He likes to read the Bible and do yard work. Daughter reports that he writes his lists things to do, but he becomes argumentative about this topic including grocery list. repeats oneself?  Endorsed in the lobby he asked the same thing 4 times Disoriented when walking into a room? Denies ***  Leaving objects?  May misplace things including money, but not often  Wandering behavior?  Denies.   Any personality changes since last visit?  Denies.  ***depends if he takes the meds Any worsening depression?:  Denies.   Hallucinations or paranoia?    No visual or auditory hallucinations.  He is paranoid about money Seizures? denies    Any sleep changes? Sleeps well.  Denies vivid dreams, REM behavior or  sleepwalking   Sleep apnea?   Denies.   Any hygiene concerns? Denies.  Independent of bathing and dressing?  Endorsed  Does the patient needs help with medications?  Children are in charge *** Who is in charge of the finances?  Daughter is in charge   ****** Any changes in appetite?  denies, although he may not be eating enough fruits and vegetables   Patient have trouble swallowing? Denies.   Does the patient cook? Yes, daughter reports that he is not cooking healthy.  Any headaches?  He has a history of chronic headaches, one today on top of the head . Any vision changes?has some floater.  Chronic back pain endorsed, he sees orthopedics, needs to wear a back brace.   Ambulates with difficulty? Denies even rides a bicycle Recent falls or head injuries? Denies.     Unilateral weakness, numbness or tingling? denies   Any tremors?  Denies    Any anosmia?  Denies   Any incontinence of urine?  Endorsed, uses pads, recently saw urologist   Any bowel dysfunction?   Denies      Patient lives alone  Does the patient drive?  He continues to drive with close monitoring, short distances    Neuropsych evaluation 07/2022 . Briefly, results suggested severe impairment surrounding all aspects of learning and memory. Additional impairments were exhibited across cognitive flexibility and phonemic fluency, while performance variability was exhibited across processing speed, confrontation naming, and visuospatial abilities. Regarding the cause for severe memory impairment,  I do have primary concerns surrounding an underlying neurodegenerative illness, namely Alzheimer's disease. Across memory tasks, [name] did not benefit from repeated exposure to novel information, was fully amnestic (i.e., 0% retention) across 2/3 tasks and only exhibited 14% retention across a final memory task after brief delays, and performed poorly across yes/no recognition trials. This suggests evidence for rapid forgetting and an  evolving and already quite significant storage impairment, both of which are the hallmark testing patterns for Alzheimer's disease. His recent brain MRI also suggested subjective temporal lobe predominance for ongoing atrophy, which, if this becomes objectively true, is a well-established risk factor for Alzheimer's disease. Further dysfunction surrounding confrontation naming and cognitive flexibility align with expected disease progression. Continued medical monitoring will be important moving forward.    PREVIOUS MEDICATIONS:   CURRENT MEDICATIONS:  Outpatient Encounter Medications as of 07/19/2023  Medication Sig   Cholecalciferol (VITAMIN D3) 50 MCG (2000 UT) TABS Take 50 mcg by mouth once.   dicyclomine  (BENTYL ) 10 MG capsule TAKE 1 CAPSULE (10 MG TOTAL) BY MOUTH 4 (FOUR) TIMES DAILY AS NEEDED FOR SPASMS.   donepezil  (ARICEPT ) 10 MG tablet Take half tablet (5 mg) daily for 2 weeks, then increase to the full tablet at 10 mg daily.   glipiZIDE (GLUCOTROL) 5 MG tablet Take 10 mg by mouth once.   losartan -hydrochlorothiazide  (HYZAAR) 50-12.5 MG tablet Take 1 tablet by mouth daily.   METFORMIN  HCL ER, OSM, PO Take 500 mg by mouth in the morning and at bedtime. Am and pm   pantoprazole  (PROTONIX ) 40 MG tablet Take 40 mg by mouth 2 (two) times daily.   rosuvastatin  (CRESTOR ) 5 MG tablet Take 5 mg by mouth daily.   No facility-administered encounter medications on file as of 07/19/2023.        No data to display            09/05/2022    8:00 AM 08/13/2022   10:00 AM  Montreal Cognitive Assessment   Visuospatial/ Executive (0/5) 2 1  Naming (0/3) 3 3  Attention: Read list of digits (0/2) 2 2  Attention: Read list of letters (0/1) 1 1  Attention: Serial 7 subtraction starting at 100 (0/3) 0 0  Language: Repeat phrase (0/2) 1 1  Language : Fluency (0/1) 0 0  Abstraction (0/2) 0 0  Delayed Recall (0/5) 0 0  Orientation (0/6) 4 4  Total 13 12  Adjusted Score (based on education) 14 13     Objective:     PHYSICAL EXAMINATION:    VITALS:  There were no vitals filed for this visit.  GEN:  The patient appears stated age and is in NAD. HEENT:  Normocephalic, atraumatic.   Neurological examination:  General: NAD, well-groomed, appears stated age. Orientation: The patient is alert. Oriented to person, not to place or date. Cranial nerves: There is good facial symmetry.anxious appearing.  The speech is fluent and clear. No aphasia or dysarthria. Fund of knowledge is reduced. Recent and remote memory are impaired. Attention and concentration are reduced. Able to name objects and repeat phrases.  Hearing is intact to conversational tone. *** Sensation: Sensation is intact to light touch throughout Motor: Strength is at least antigravity x4. DTR's 2/4 in UE/LE     Movement examination: Tone: There is normal tone in the UE/LE Abnormal movements:  no tremor.  No myoclonus.  No asterixis.   Coordination:  There is no decremation with RAM's. Normal finger to nose  Gait and Station: The patient  has no*** difficulty arising out of a deep-seated chair without the use of the hands. The patient's stride length is good.  Gait is cautious and narrow.    Thank you for allowing us  the opportunity to participate in the care of this nice patient. Please do not hesitate to contact us  for any questions or concerns.   Total time spent on today's visit was *** minutes dedicated to this patient today, preparing to see patient, examining the patient, ordering tests and/or medications and counseling the patient, documenting clinical information in the EHR or other health record, independently interpreting results and communicating results to the patient/family, discussing treatment and goals, answering patient's questions and coordinating care.  Cc:  Campbell Reynolds, NP  Camie Sevin 07/19/2023 5:48 AM   Fruits and vegetables

## 2023-07-24 ENCOUNTER — Ambulatory Visit: Payer: Medicare Other | Admitting: Physician Assistant

## 2023-08-28 ENCOUNTER — Ambulatory Visit: Admitting: Physician Assistant

## 2023-09-13 ENCOUNTER — Encounter: Payer: Self-pay | Admitting: Neurology

## 2023-09-13 ENCOUNTER — Telehealth: Payer: Self-pay | Admitting: Physician Assistant

## 2023-09-13 NOTE — Telephone Encounter (Signed)
 Pt.s daughter cld, Pt. Would like a letter to dismiss him from jury duty on 09/25/23, xplnd [place] out of [place] and another provider may be needed to create a signed letter

## 2023-11-25 ENCOUNTER — Encounter: Payer: Self-pay | Admitting: Radiology

## 2023-11-27 ENCOUNTER — Telehealth: Payer: Self-pay

## 2023-11-27 MED ORDER — DICYCLOMINE HCL 10 MG PO CAPS: 10.0000 mg | ORAL_CAPSULE | Freq: Four times a day (QID) | ORAL | 0 refills | Status: AC | PRN

## 2023-11-27 NOTE — Telephone Encounter (Signed)
 [facility] requested refill of bentyl 

## 2023-12-18 ENCOUNTER — Encounter: Payer: Self-pay | Admitting: Psychology

## 2023-12-23 ENCOUNTER — Ambulatory Visit: Payer: Self-pay | Admitting: Psychology

## 2023-12-23 ENCOUNTER — Encounter: Payer: Self-pay | Admitting: Psychology

## 2023-12-23 ENCOUNTER — Ambulatory Visit: Payer: Medicare Other | Admitting: Psychology

## 2023-12-23 DIAGNOSIS — R4189 Other symptoms and signs involving cognitive functions and awareness: Secondary | ICD-10-CM

## 2023-12-23 DIAGNOSIS — F04 Amnestic disorder due to known physiological condition: Secondary | ICD-10-CM

## 2023-12-23 DIAGNOSIS — G309 Alzheimer's disease, unspecified: Secondary | ICD-10-CM

## 2023-12-23 DIAGNOSIS — F0280 Dementia in other diseases classified elsewhere without behavioral disturbance: Secondary | ICD-10-CM | POA: Diagnosis not present

## 2023-12-23 NOTE — Progress Notes (Signed)
 NEUROPSYCHOLOGICAL EVALUATION [place]. [facility] Department of Neurology  Date of Evaluation: December 23, 2023  Reason for Referral:   [name] is a 73 y.o. right-handed African-American male referred by Camie Sevin, PA-C, to characterize his current cognitive functioning and assist with diagnostic clarity and treatment planning in the context of a previously diagnosed major neurocognitive disorder and concern for progressive cognitive decline.   Assessment and Plan:   Clinical Impression(s): [name] pattern of performance is suggestive of severe impairment surrounding cognitive flexibility, phonemic fluency, and all aspects of learning and memory. Further performance weakness/variability was exhibited across processing speed, semantic fluency, and confrontation naming. Outside of an isolated impairment across a line orientation task, visuospatial abilities were appropriate. Performances were also appropriate relative to age-matched peers across attention/concentration and receptive language. Functionally, his family has fully taken over medication management and bill paying responsibilities. Family has also expressed concern surrounding overall hygiene, financial management, and driving. Given evidence for cognitive and functional impairment, [name] continues to meet diagnostic criteria for a Major Neurocognitive Disorder (dementia) at the present time.  Relative to his previous evaluation in October 2024, a good degree of stability was exhibited. Severe memory impairment was exhibited across both evaluations with retention rates consistently being at or very near 0%. No cognitive domain exhibited appreciable decline relative to previous testing.    Regarding the cause for his dementia presentation, concerns remain surrounding Alzheimer's disease. Across memory tasks, [name] was fully amnestic (i.e., 0% retention) across 2/3 tasks and only  exhibited 17% retention (which amounted to recall of a single item) across a final memory task after brief delays. He also performed quite poorly across yes/no recognition trials. This continues to suggest evidence for rapid forgetting and an evolving and already quite significant storage impairment, both of which are the hallmark testing patterns seen in Alzheimer's disease. Prior neuroimaging further suggested atrophy with subjective temporal lobe predominance, which, if objectively true, is a well-established risk factor for Alzheimer's disease. Further dysfunction surrounding confrontation naming and cognitive flexibility align with expected disease progression, as well as prominent anosognosia. Continued medical monitoring will be important moving forward.   Recommendations: [name] has already been prescribed a medication aimed to address memory loss and concerns surrounding Alzheimer's disease (i.e., donepezil /Aricept ). He is encouraged to continue taking this medication exactly as prescribed. It is important to highlight that this medication has been shown to slow functional decline in some individuals. There is no current treatment which can stop or reverse cognitive decline when caused by a neurodegenerative illness.   Performance across neurocognitive testing is not a strong predictor of an individual's safety operating a motor vehicle. With that being said, I do have some concern given weakness/impairment surrounding processing speed, cognitive flexibility, and memory, as well as his lack of insight into cognitive/functional limitations. It is my recommendation that he seek out a formal driving evaluation to best assess his safety behind the wheel. Should he or his family wish to pursue a formalized driving evaluation, they could reach out to the following agencies: [employer] [facility] in [place]: [phone] [facility]: [phone] [facility]:  [phone] [facility]: [phone] or [phone]  It will be important for [name] to have another person with him when in situations where he may need to process information, weigh the pros and cons of different options, and make decisions, in order to ensure that he fully understands and recalls all information to be considered. Family  should continue to manage medications and bill paying, as well as continue to be directly involved in financial management. They may wish to discuss future plans for caretaking and seek out community options for in home/residential care should they become necessary.  [name] is encouraged to attend to lifestyle factors for brain health (e.g., regular physical exercise, good nutrition habits and consideration of the MIND-DASH diet, regular participation in cognitively-stimulating activities, and general stress management techniques), which are likely to have benefits for both emotional adjustment and cognition. Optimal control of vascular risk factors (including safe cardiovascular exercise and adherence to dietary recommendations) is encouraged. Continued participation in activities which provide mental stimulation and social interaction is also recommended.   Important information should be provided to [name] in written format in all instances. This information should be placed in a highly frequented and easily visible location within his home to promote recall. External strategies such as written notes in a consistently used memory journal, visual and nonverbal auditory cues such as a calendar on the refrigerator or appointments with alarm, such as on a cell phone, can also help maximize recall.  To address problems with processing speed, he may wish to consider:   -Ensuring that he is alerted when essential material or instructions are being presented   -Adjusting the speed at which new information is presented   -Allowing for more time in  comprehending, processing, and responding in conversation   -Repeating and paraphrasing instructions or conversations aloud  To address problems with fluctuating attention and/or executive dysfunction, he may wish to consider:   -Avoiding external distractions when needing to concentrate   -Limiting exposure to fast paced environments with multiple sensory demands   -Writing down complicated information and using checklists   -Attempting and completing one task at a time (i.e., no multi-tasking)   -Verbalizing aloud each step of a task to maintain focus   -Taking frequent breaks during the completion of steps/tasks to avoid fatigue   -Reducing the amount of information considered at one time   -Scheduling more difficult activities for a time of day where he is usually most alert  Review of Records:   [name] completed a comprehensive neuropsychological evaluation with myself on 11/05/2022. Results suggested severe impairment surrounding all aspects of learning and memory. Additional impairments were exhibited across cognitive flexibility and phonemic fluency, while performance variability was exhibited across processing speed, confrontation naming, and visuospatial abilities. Performances were appropriate relative to age-matched peers across basic attention, receptive language and semantic fluency. While he denied all functional concerns, medical records suggested that family had become quite involved in several activities of daily living, leading to a formal major neurocognitive disorder (dementia) diagnosis. Concerns were expressed for an underlying neurodegenerative illness, possibly Alzheimer's disease.   Past Medical History:  Diagnosis Date   Cervical radiculopathy 11/12/2019   Chest pain 01/13/2011   DDD (degenerative disc disease), cervical 11/12/2019   Dementia, likely due to Alzheimer's disease 11/05/2022   Diabetes mellitus type 2, noninsulin dependent 01/13/2011    Diverticulosis    Esophageal spasm    Essential hypertension    Fatty liver    GERD (gastroesophageal reflux disease) 01/13/2011   Headache 03/26/2013   History of cardiac cath    a. 2005: normal cors, normal EF. b. Nuc 12/2010: normal, EF 61%. 2D Echo 12/2010: EF normal (% not given), no RWMA, trivial AI, aortic root trivially dilated, mild [name].   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 Amiel     Hyperlipidemia    Left sided numbness 02/01/2014   associated with TIA   Lower abdominal pain 01/31/2014   Seasonal allergies    TIA (transient ischemic attack) 02/01/2014   Unintentional weight loss 01/31/2014   Wears glasses     Past Surgical History:  Procedure Laterality Date   CARDIAC CATHETERIZATION  2005   No CAD   CIRCUMCISION N/A 07/23/2018   Procedure: CIRCUMCISION ADULT;  Surgeon: Carolee Sherwood JONETTA DOUGLAS, MD;  Location: [facility];  Service: Urology;  Laterality: N/A;   COLONOSCOPY     PROSTATECTOMY      Current Outpatient Medications:    Cholecalciferol (VITAMIN D3) 50 MCG (2000 UT) TABS, Take 50 mcg by mouth once., Disp: , Rfl:    dicyclomine  (BENTYL ) 10 MG capsule, Take 1 capsule (10 mg total) by mouth 4 (four) times daily as needed for spasms., Disp: 90 capsule, Rfl: 0   donepezil  (ARICEPT ) 10 MG tablet, Take half tablet (5 mg) daily for 2 weeks, then increase to the full tablet at 10 mg daily., Disp: 30 tablet, Rfl: 11   glipiZIDE (GLUCOTROL) 5 MG tablet, Take 10 mg by mouth once., Disp: , Rfl:    losartan -hydrochlorothiazide  (HYZAAR) 50-12.5 MG tablet, Take 1 tablet by mouth daily., Disp: 90 tablet, Rfl: 3   METFORMIN  HCL ER, OSM, PO, Take 500 mg by mouth in the morning and at bedtime. Am and pm, Disp: , Rfl:    pantoprazole  (PROTONIX ) 40 MG tablet, Take 40 mg by mouth 2 (two) times daily., Disp: , Rfl:    rosuvastatin  (CRESTOR ) 5 MG tablet, Take 5 mg by mouth daily., Disp: , Rfl:      07/20/2023    1:00  PM  MMSE - Mini Mental State Exam  Orientation to time 2  Orientation to Place 4  Registration 3  Attention/ Calculation 5  Recall 0  Language- name 2 objects 2  Language- repeat 1  Language- follow 3 step command 3  Language- read & follow direction 1  Write a sentence 1  Copy design 1  Total score 23      09/05/2022    8:00 AM 08/13/2022   10:00 AM  Montreal Cognitive Assessment   Visuospatial/ Executive (0/5) 2 1  Naming (0/3) 3 3  Attention: Read list of digits (0/2) 2 2  Attention: Read list of letters (0/1) 1 1  Attention: Serial 7 subtraction starting at 100 (0/3) 0 0  Language: Repeat phrase (0/2) 1 1  Language : Fluency (0/1) 0 0  Abstraction (0/2) 0 0  Delayed Recall (0/5) 0 0  Orientation (0/6) 4 4  Total 13 12  Adjusted Score (based on education) 14 13   Neuroimaging: Brain MRI on 02/01/2014 in the context of stroke/TIA concerns was unremarkable. Brain MRI on 09/04/2022 revealed generalized age-related atrophy with the potential for some subjective temporal lobe predominance, as well as minimal to mild microvascular ischemic disease.   Clinical Interview:   The following information was obtained during a clinical interview with [name] and his daughter prior to cognitive testing.  Cognitive Symptoms: Decreased short-term memory: Denied. His daughter expressed concerns surrounding rapid forgetting and frequent repetition in day-to-day conversation. She expressed some concern that difficulties have progressively worsened over time, including the time since his October 2024 evaluation.  Decreased long-term memory: Denied. Decreased attention/concentration: Denied. Reduced processing speed: Denied. Difficulties with executive functions: Denied. He also denied trouble with impulsivity  or any significant personality changes. His daughter noted some periods of him being ornery, largely manifesting in defensiveness surrounding cognitive limitations and financial  management. However, she largely denied prominent and persistent personality changes as well.  Difficulties with emotion regulation: Denied. Difficulties with receptive language: Denied. Difficulties with word finding: Denied. Decreased visuoperceptual ability: Denied.  Difficulties completing ADLs: Endorsed. [name] denied all concerns. His daughter acknowledged that he continues to live alone. Family has fully taken over medication management. His daughter noted that [name] has poor medication adherence. Some of this appeared willful rather than based solely on forgetfulness. She noted that he may purposefully take less than what is prescribed, as well as instances where he has sought out his memory medication (donepezil /Aricept ) so that he can avoid taking it. Family has also taken over bill paying responsibilities. His daughter noted further concerns that he will overspend, noting that he has risked defaulting or overdrafting his debit card several times. He continues to drive. His family has the [employer] application and monitors him in this regard. His daughter also added that [name] does not appear to change clothes daily and that the cleanliness of his home, especially his refrigerator and bathroom, has notably declined.   Additional Medical History: History of traumatic brain injury/concussion: Denied. History of stroke: Denied. Medical records suggest a prior TIA in 2016 with some transient left-sided numbness/weakness which resolved.  History of seizure activity: Denied. History of known exposure to toxins: Denied. Symptoms of chronic pain: Denied. Experience of frequent headaches/migraines: Denied. Frequent instances of dizziness/vertigo: Denied.   Sensory changes: He wears glasses with benefit. He previously reported needing to see his physician about cataract concerns. He again commented about potentially having a cataract during the current interview. It was unclear if  this represented true visual difficulty (he later commented having no trouble reading or perceiving things in his environment) or an attempt to alter the planned evaluation. Other sensory changes/difficulties (e.g., hearing, taste, smell) were denied.  Balance/coordination difficulties: Denied. He also denied any recent falls.  Other motor difficulties: Denied.  Sleep History: Estimated hours obtained each night: 8 hours.  Difficulties falling asleep: Denied. Difficulties staying asleep: Denied. Feels rested and refreshed upon awakening: Endorsed.   History of snoring: Denied. History of waking up gasping for air: Denied. Witnessed breath cessation while asleep: Denied.   History of vivid dreaming: Denied. Excessive movement while asleep: Denied. Instances of acting out his dreams: Denied.  Psychiatric/Behavioral Health History: Depression: He described his current mood as positive and denied to his knowledge any prior mental health concerns or diagnoses. Current or remote suicidal ideation, intent, or plan was denied.  Anxiety: Denied. Mania: Denied. Trauma History: Denied. Visual/auditory hallucinations: Denied. Delusional thoughts: Denied. His daughter did express paranoia concerns, largely surrounding [name] feeling that family is stealing money from him. His daughter noted that he had cancelled his debit card on three occasions over the past several months due to these concerns.    Tobacco: Denied. Alcohol: He denied current alcohol consumption as well as a history of problematic alcohol abuse or dependence.  Recreational drugs: Denied.  Family History: Problem Relation Age of Onset   Cancer Mother    Colon cancer Father    COPD Sister    Hypertension Sister    Stroke Brother        Clemens and hit head, ? stroke   CAD Brother        Bypass in his 63's.   Stomach cancer Neg  Hx    Esophageal cancer Neg Hx    Colon polyps Neg Hx    This information was confirmed by  [name].  Academic/Vocational History: Highest level of educational attainment: 12 years. He left high school in the 11th grade. 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 He reported going back to school and earning his high school diploma as an adult. He described himself as an average student in academic settings. No relative weaknesses were identified.  History of developmental delay: Denied. History of grade repetition: Denied. Enrollment in special education courses: Denied. History of LD/ADHD: Denied.   Employment: Retired. 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   Evaluation Results:   Behavioral Observations: [name] was accompanied by his daughter, arrived to his appointment on time, and was appropriately dressed and groomed. He appeared alert. Observed gait and station were within normal limits. Gross motor functioning appeared intact upon informal observation and no abnormal movements (e.g., tremors) were noted. His affect was generally positive. However, he did quickly become mildly defensive whenever his daughter would attempt to provide her perception of functioning. Spontaneous speech was fluent and word finding difficulties were not observed during the clinical interview. Thought processes were coherent, organized, and normal in content. Insight into his cognitive difficulties appeared very poor and I do not believe that [name] has appropriate insight into the extent of his limitations, especially surrounding memory capabilities.   During testing, sustained attention was appropriate initially. He did fatigue as the evaluation progressed and reported onset of headache symptoms. Testing was mildly abbreviated in response. Task engagement was adequate overall and he generally persisted when challenged. Overall, [name] was cooperative with the clinical interview and subsequent testing procedures.    Adequacy of Effort: The validity of neuropsychological testing is limited by the extent to which the individual being tested may be assumed to have exerted adequate effort during testing. [name]. Ruffini expressed his intention to perform to the best of his abilities and exhibited adequate task engagement and persistence. Scores across stand-alone and embedded performance validity measures were within expectation. As such, the results of the current evaluation are believed to be a valid representation of [name]. Moscoso's current cognitive functioning.  Test Results: [name]. Stickles was mildly disoriented at the time of the current evaluation. He was unable to state the full year (25), date, and name of the current clinic. He also incorrectly stated the month (November).   Intellectual abilities based upon educational and vocational attainment were estimated to be in the average range. Premorbid abilities were estimated to be within the well below average range based upon a single-word reading test.   Processing speed was variable, ranging from the well below average to average normative ranges. Basic attention was average. More complex attention (e.g., working memory) was also average. Cognitive flexibility was exceptionally low. Other aspects of executive functioning were unable to be assessed.  Receptive language abilities were unable to be directly assessed but believed to be intact. [name]. Kiernan did not exhibit any difficulties comprehending task instructions and answered all questions asked of him appropriately. Assessed expressive language was variable. Phonemic fluency was exceptionally low, semantic fluency was well below average to average, and confrontation naming was average across a screening task but well below average across a more comprehensive task.    Assessed visuospatial/visuoconstructional abilities were average outside of an isolated impairment across a line orientation task.  Learning (i.e., encoding) of novel verbal information was well below average to below average. Spontaneous delayed recall (i.e., retrieval) of previously learned information was exceptionally low. Retention rates were 0% across a list learning task, 17% (raw score of 1) across a story learning task, and 0% across a figure drawing task. Performance across recognition tasks was exceptionally low to well below average, suggesting negligible evidence for information consolidation.   Results of emotional screening instruments suggested that recent symptoms of generalized anxiety were in the minimal range, while symptoms of depression were within normal limits. A screening instrument assessing recent sleep quality suggested the presence of minimal sleep dysfunction.  Table of Scores:   Note: This summary of test scores accompanies the interpretive report and should not be considered in isolation without reference to the appropriate sections in the text. Descriptors are based on appropriate normative data and may be adjusted based on clinical judgment. Terms such as Within Normal Limits and Outside Normal Limits are used when a more specific description of the test score cannot be determined. Descriptors refer to the current evaluation only.        Percentile - Normative Descriptor > 98 - Exceptionally High 91-97 - Well Above Average 75-90 - Above Average 25-74 - Average 9-24 - Below Average 2-8 - Well Below Average < 2 - Exceptionally Low        Validity: October 2024 Current  DESCRIPTOR        DCT: --- --- --- Within Normal Limits  RBANS EI: --- --- --- Within Normal Limits  WAIS-IV RDS: --- --- --- Within Normal Limits        Orientation:       Raw Score Raw Score Percentile   NAB Orientation, Form 1 25/29 22/29 --- ---        Cognitive Screening:       Raw Score Raw Score Percentile   SLUMS: 16/30 14/30 --- ---        RBANS, Form A: Standard Score/ Scaled Score Standard  Score/ Scaled Score Percentile   Total Score 64 67 1 Exceptionally Low  Immediate Memory 65 76 5 Well Below Average    List Learning 3 6 9  Below Average    Story Memory 5 5 5  Well Below Average  Visuospatial/Constructional 87 78 7 Well Below Average    Figure Copy 12 9 37 Average    Line Orientation 9/20 9/20 <2 Exceptionally Low  Language 90 85 16 Below Average    Picture Naming 9/10 10/10 51-75 Average    Semantic Fluency 6 4 2  Well Below Average  Attention 75 85 16 Below Average    Digit Span 7 10 50 Average    Coding 5 5 5  Well Below Average  Delayed Memory 40 48 <1 Exceptionally Low    List Recall 0/10 0/10 <2 Exceptionally Low    List Recognition 12/20 14/20 <2 Exceptionally Low    Story Recall 2 2 <1 Exceptionally Low    Story Recognition 8/12 5/12 1-2 Exceptionally Low    Figure Recall 1 1 <1 Exceptionally Low    Figure Recognition 0/8 3/8 4-8 Well Below Average         Intellectual Functioning:       Standard Score Standard Score Percentile   Test of Premorbid Functioning: 76 78 7 Well Below Average        Attention/Executive Function:      Trail Making Test (TMT): Raw Score (T Score) Raw Score (T Score) Percentile  Part A 52 secs.,  0 errors (49) 53 secs.,  0 errors (47) 38 Average    Part B Discontinued Discontinued --- Impaired          Scaled Score Scaled Score Percentile   WAIS-IV Digit Span: --- 9 37 Average    Forward --- 8 25 Average    Backward --- 9 37 Average    Sequencing --- 9 37 Average        D-KEFS Verbal Fluency Test: Raw Score (Scaled Score) Raw Score (Scaled Score) Percentile     Letter Total Correct 13 (3) 9 (2) <1 Exceptionally Low    Category Total Correct 24 (6) 22 (6) 9 Below Average    Category Switching Total Correct 9 (6) 7 (4) 2 Well Below Average    Category Switching Accuracy 8 (7) 3 (2) <1 Exceptionally Low      Total Set Loss Errors 2 (10) 10 (1) <1 Exceptionally Low      Total Repetition Errors 6 (7) 6 (7) 16 Below Average         Language:      Verbal Fluency Test: Raw Score (T Score) Raw Score (T Score) Percentile     Phonemic Fluency (FAS) 13 (32) 9 (26) 1 Exceptionally Low    Animal Fluency 12 (45) 13 (47) 38 Average         NAB Language Module, Form 1: T Score T Score Percentile     Naming 22/31 (24) 23/31 (29) 2 Well Below Average        Visuospatial/Visuoconstruction:       Raw Score Raw Score Percentile   Clock Drawing: 9/10 9/10 --- Within Normal Limits        Mood and Personality:       Raw Score Raw Score Percentile   Geriatric Depression Scale: 2 1 --- Within Normal Limits  Geriatric Anxiety Scale: 1 2 --- Minimal    Somatic 0 1 --- Minimal    Cognitive 1 1 --- Minimal    Affective 0 0 --- Minimal        Additional Questionnaires:       Raw Score Raw Score Percentile   PROMIS Sleep Disturbance Questionnaire: 9 8 --- None to Slight   Informed Consent and Coding/Compliance:   The current evaluation represents a clinical evaluation for the purposes previously outlined by the referral source and is in no way reflective of a forensic evaluation.   [name]. Gary was provided with a verbal description of the nature and purpose of the present neuropsychological evaluation. Also reviewed were the foreseeable risks and/or discomforts and benefits of the procedure, limits of confidentiality, and mandatory reporting requirements of this provider. The patient was given the opportunity to ask questions and receive answers about the evaluation. Oral consent to participate was provided by the patient.   This evaluation was conducted by Arthea KYM Maryland, Ph.D., ABPP-CN, board certified clinical neuropsychologist. [name]. Souder completed a clinical interview with Dr. Maryland, billed as one unit [phone], and 120 minutes of cognitive testing and scoring, billed as one unit [phone] and three additional units [id number]. Psychometrist Lonell Jude, B.S. assisted Dr. Maryland with test administration and scoring procedures. As a  separate and discrete service, one unit [phone] and two units 96133 (155 minutes) were billed for Dr. Loralee time spent in interpretation and report writing.

## 2023-12-23 NOTE — Progress Notes (Signed)
   Psychometrician Note   Cognitive testing was administered to [name] by Lonell Jude, B.S. (psychometrist) under the supervision of Dr. Arthea KYM Maryland, Ph.D., ABPP, licensed psychologist on 12/23/2023. [name] did not appear overtly distressed by the testing session per behavioral observation or responses across self-report questionnaires. Rest breaks were offered.   The battery of tests administered was selected by Dr. Zachary C. Merz, Ph.D., ABPP with consideration to [name]'s current level of functioning, the nature of his symptoms, emotional and behavioral responses during interview, level of literacy, observed level of motivation/effort, and the nature of the referral question. This battery was communicated to the psychometrist. Communication between Dr. Arthea KYM Maryland, Ph.D., ABPP and the psychometrist was ongoing throughout the evaluation and Dr. Arthea KYM Maryland, Ph.D., ABPP was immediately accessible at all times. Dr. Zachary C. Merz, Ph.D., ABPP provided supervision to the psychometrist on the date of this service to the extent necessary to assure the quality of all services provided.    [name] will return within approximately 1-2 weeks for an interactive feedback session with Dr. Maryland at which time his test performances, clinical impressions, and treatment recommendations will be reviewed in detail. [name] understands he can contact our office should he require our assistance before this time.  A total of 120 minutes of billable time were spent face-to-face with [name] by the psychometrist. This includes both test administration and scoring time. Billing for these services is reflected in the clinical report generated by Dr. Arthea KYM Maryland, Ph.D., ABPP  This note reflects time spent with the psychometrician and does not include test scores or any clinical interpretations made by Dr. Maryland. The full report will follow in a separate note.

## 2023-12-29 ENCOUNTER — Other Ambulatory Visit: Payer: Self-pay | Admitting: Physician Assistant

## 2023-12-30 ENCOUNTER — Encounter: Payer: Medicare Other | Admitting: Psychology

## 2024-01-02 ENCOUNTER — Ambulatory Visit: Admitting: Psychology

## 2024-01-02 DIAGNOSIS — F0280 Dementia in other diseases classified elsewhere without behavioral disturbance: Secondary | ICD-10-CM | POA: Diagnosis not present

## 2024-01-02 DIAGNOSIS — F04 Amnestic disorder due to known physiological condition: Secondary | ICD-10-CM

## 2024-01-02 DIAGNOSIS — G309 Alzheimer's disease, unspecified: Secondary | ICD-10-CM | POA: Diagnosis not present

## 2024-01-02 NOTE — Progress Notes (Signed)
° °  Neuropsychology Feedback Session [name]. [facility] Department of Neurology  Reason for Referral:   [name] is a 73 y.o. right-handed African-American male referred by Camie Sevin, PA-C, to characterize his current cognitive functioning and assist with diagnostic clarity and treatment planning in the context of a previously diagnosed major neurocognitive disorder and concern for progressive cognitive decline.   Feedback:   [name] completed a comprehensive neuropsychological evaluation on 12/23/2023. Please refer to that encounter for the full report and recommendations. Briefly, results suggested severe impairment surrounding cognitive flexibility, phonemic fluency, and all aspects of learning and memory. Further performance weakness/variability was exhibited across processing speed, semantic fluency, and confrontation naming. Outside of an isolated impairment across a line orientation task, visuospatial abilities were appropriate. Regarding the cause for his dementia presentation, concerns remain surrounding Alzheimer's disease. Across memory tasks, [name] was fully amnestic (i.e., 0% retention) across 2/3 tasks and only exhibited 17% retention (which amounted to recall of a single item) across a final memory task after brief delays. He also performed quite poorly across yes/no recognition trials. This continues to suggest evidence for rapid forgetting and an evolving and already quite significant storage impairment, both of which are the hallmark testing patterns seen in Alzheimer's disease. Prior neuroimaging further suggested atrophy with subjective temporal lobe predominance, which, if objectively true, is a well-established risk factor for Alzheimer's disease. Further dysfunction surrounding confrontation naming and cognitive flexibility align with expected disease progression, as well as prominent anosognosia. Continued medical monitoring will be  important moving forward.  [name] was accompanied by his daughter during the current feedback session. Content of the current session focused on the results of his neuropsychological evaluation. [name] was given the opportunity to ask questions and his questions were answered. He was encouraged to reach out should additional questions arise. A copy of his report was provided at the conclusion of the visit.      One unit 96132 (31 minutes) was billed for Dr. Loralee time spent preparing for, conducting, and documenting the current feedback session with [name].

## 2024-02-03 ENCOUNTER — Ambulatory Visit: Admitting: Physician Assistant

## 2024-02-03 VITALS — Wt 139.0 lb

## 2024-02-03 DIAGNOSIS — G309 Alzheimer's disease, unspecified: Secondary | ICD-10-CM

## 2024-02-03 DIAGNOSIS — F0280 Dementia in other diseases classified elsewhere without behavioral disturbance: Secondary | ICD-10-CM

## 2024-02-03 MED ORDER — MEMANTINE HCL 5 MG PO TABS: ORAL_TABLET | ORAL | 3 refills | Status: AC

## 2024-02-03 MED ORDER — DONEPEZIL HCL 10 MG PO TABS: ORAL_TABLET | ORAL | 3 refills | Status: AC

## 2024-02-03 NOTE — Progress Notes (Signed)
 "   Dementia likely due to Alzheimer's Disease    [name] is a very pleasant 74 y.o. 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 Patient is currently on donepezil  10 mg daily.  Patient was last seen on 07/19/2023 . Memory decline is noted, discussed adding memantine  to the regimen, agrees to proceed .  Patient is able to participate on ADLs and continues to drive short distances  without difficulties. Mood is anxious as prior . This patient is accompanied in the office by his daughter who supplements the history.  Previous records as well as any outside records available were reviewed prior to todays visit.   Follow up in 6 months Continue donepezil  10 mg daily. Side effects were discussed   Start Memantine  5 mg: Take 1 tablet (5 mg at night) for 2 weeks, then increase to 1 tablet (5 mg) twice a day, side effects discussed , goal 10 mg bid if tolerated   Recommend good control of cardiovascular risk factors.   Continue to control mood as per PCP Monitor driving   Discussed the use of AI scribe software for clinical note transcription with the patient, who gave verbal consent to proceed.  History of Present Illness [name] is a 74 year old male who presents for follow-up for memory loss. He is accompanied by his daughter, who is involved in his care and medication management.  His memory has declined in performance  since June 2025, without significant changes from day to day. He experiences difficulty with short-term memory, particularly with conversations and names. He continues to engage in activities such as reading, doing yard work, and gaffer. Occasionally, he repeats questions or  stories, but he is mostly independent and familiar with his surroundings. He finds operating a new smart TV challenging due to unfamiliarity with the technology, and directions have been written down for him. There are no personality changes noted, but his daughter can tell when he has not taken his medication. She monitors his meds closely. He does not experience worsening depression, visual or auditory hallucinations, paranoia, or seizures. He is paranoid about money, although his daughter manages his finances. He continues to drive short distances but avoids long-distance driving.  He cooks using recipes from memory even the ones my mom taught me, eats well not very healthy, and drinks about four to five bottles of water daily.  He has a history of chronic headaches, sometimes related to blood pressure, with no recent worsening or hospitalizations. He also has chronic back pain but is not currently undergoing physical therapy. He is followed by a urologist for urinary incontinence and uses pads, with no recent infections or changes in bowel habits. He lives alone, prefers it that way, and his children monitor him. He sleeps well at night without nightmares or vivid dreams and is independent in daily activities such as dressing and showering.      Neuropsych evaluation [name]  Briefly, results suggested severe impairment surrounding cognitive flexibility, phonemic fluency, and all aspects of learning and memory. Further performance weakness/variability was exhibited across processing speed, semantic fluency, and confrontation naming. Outside of an isolated impairment across a line orientation task, visuospatial abilities were appropriate. Regarding the cause for  his dementia presentation, concerns remain surrounding Alzheimer's disease. Across memory tasks, [name] was fully amnestic (i.e., 0% retention) across 2/3 tasks and only exhibited 17% retention (which amounted to recall of a single item)  across a final memory task after brief delays. He also performed quite poorly across yes/no recognition trials. This continues to suggest evidence for rapid forgetting and an evolving and already quite significant storage impairment, both of which are the hallmark testing patterns seen in Alzheimer's disease. Prior neuroimaging further suggested atrophy with subjective temporal lobe predominance, which, if objectively true, is a well-established risk factor for Alzheimer's disease. Further dysfunction surrounding confrontation naming and cognitive flexibility align with expected disease progression, as well as prominent anosognosia. Continued medical monitoring will be important moving forward.      07/20/2023    1:00 PM  MMSE - Mini Mental State Exam  Orientation to time 2  Orientation to Place 4  Registration 3  Attention/ Calculation 5  Recall 0  Language- name 2 objects 2  Language- repeat 1  Language- follow 3 step command 3  Language- read & follow direction 1  Write a sentence 1  Copy design 1  Total score 23      09/05/2022    8:00 AM 08/13/2022   10:00 AM  Montreal Cognitive Assessment   Visuospatial/ Executive (0/5) 2 1  Naming (0/3) 3 3  Attention: Read list of digits (0/2) 2 2  Attention: Read list of letters (0/1) 1 1  Attention: Serial 7 subtraction starting at 100 (0/3) 0 0  Language: Repeat phrase (0/2) 1 1  Language : Fluency (0/1) 0 0  Abstraction (0/2) 0 0  Delayed Recall (0/5) 0 0  Orientation (0/6) 4 4  Total 13 12  Adjusted Score (based on education) 14 13      Objective:    Neurological Exam:    VITALS:   Vitals:   02/03/24 1055  Weight: 139 lb (63 kg)    GEN:  The patient appears stated age and is in NAD. HEENT:  Normocephalic, atraumatic.   Neurological examination:  General: NAD, well-groomed, appears stated age. Orientation: The patient is alert. Oriented to person, not to place and not to date Cranial nerves: There is good facial  symmetry. Anxious appearing. The speech is fluent and clear. No aphasia or dysarthria. Fund of knowledge is appropriate. Recent and remote memory are impaired. Attention and concentration are reduced. Able to name objects and repeat phrases.  Hearing is intact to conversational tone.  Sensation: Sensation is intact to light touch throughout Motor: Strength is at least antigravity x4. DTR's 2/4 in UE/LE     Movement examination:  Tone: There is normal tone in the UE/LE Abnormal movements:  no tremor.  No myoclonus.  No asterixis.   Coordination:  There is no decremation with RAM's. Normal finger to nose  Gait and Station: The patient has no difficulty arising out of a deep-seated chair without the use of the hands. The patient's stride length is good.  Gait is cautious and narrow.    Thank you for allowing us  the opportunity to participate in the care of this nice patient. Please do not hesitate to contact us  for any questions or concerns.   Total time spent on today's visit was 25 minutes dedicated to this patient today, preparing to see patient, examining the patient, ordering tests and/or medications and counseling the patient, documenting clinical information in the EHR or other health record, independently interpreting results and communicating  results to the patient/family, discussing treatment and goals, answering patient's questions and coordinating care.  Cc:  Campbell Reynolds, NP  Camie Sevin 02/03/2024 11:15 AM      "

## 2024-02-03 NOTE — Patient Instructions (Signed)
 It was a pleasure to see you today at our office.   Recommendations:  Continue  Donepezil  10 mg  daily.   Start Memantine  5mg  tablets.  Take 1 tablet at bedtime for 2 weeks, then 1 tablet twice daily.    Follow up in 6  months     [email or web address]   RECOMMENDATIONS FOR ALL PATIENTS WITH MEMORY PROBLEMS: 1. Continue to exercise (Recommend 30 minutes of walking everyday, or 3 hours every week) 2. Increase social interactions - continue going to [place] and enjoy social gatherings with friends and family 3. Eat healthy, avoid fried foods and eat more fruits and vegetables 4. Maintain adequate blood pressure, blood sugar, and blood cholesterol level. Reducing the risk of stroke and cardiovascular disease also helps promoting better memory. 5. Avoid stressful situations. Live a simple life and avoid aggravations. Organize your time and prepare for the next day in anticipation. 6. Sleep well, avoid any interruptions of sleep and avoid any distractions in the bedroom that may interfere with adequate sleep quality 7. Avoid sugar, avoid sweets as there is a strong link between excessive sugar intake, diabetes, and cognitive impairment We discussed the Mediterranean diet, which has been shown to help patients reduce the risk of progressive memory disorders and reduces cardiovascular risk. This includes eating fish, eat fruits and green leafy vegetables, nuts like almonds and hazelnuts, walnuts, and also use olive oil. Avoid fast foods and fried foods as much as possible. Avoid sweets and sugar as sugar use has been linked to worsening of memory function.  There is always a concern of gradual progression of memory problems. If this is the case, then we may need to adjust level of care according to patient needs. Support, both to the patient and caregiver, should then be put into place.      You have been referred for a neuropsychological  evaluation (i.e., evaluation of memory and thinking abilities). Please bring someone with you to this appointment if possible, as it is helpful for the doctor to hear from both you and another adult who knows you well. Please bring eyeglasses and hearing aids if you wear them.    The evaluation will take approximately 3 hours and has two parts:   The first part is a clinical interview with the neuropsychologist (Dr. Richie or Dr. Jackquline). During the interview, the neuropsychologist will speak with you and the individual you brought to the appointment.    The second part of the evaluation is testing with the doctor's technician Neal or Luke). During the testing, the technician will ask you to remember different types of material, solve problems, and answer some questionnaires. Your family member will not be present for this portion of the evaluation.   Please note: We must reserve several hours of the neuropsychologist's time and the psychometrician's time for your evaluation appointment. As such, there is a No-Show fee of $100. If you are unable to attend any of your appointments, please contact our office as soon as possible to reschedule.      DRIVING: Regarding driving, in patients with progressive memory problems, driving will be impaired. We advise to have someone else do the driving if trouble finding directions or if minor accidents are reported. Independent driving assessment is available to determine safety of driving.   If you are interested in the driving assessment, you can contact the following:  [facility] in [place] [phone]  [facility] [phone]  [facility] [phone]  [address]  [phone] or [phone]   FALL PRECAUTIONS: Be cautious when walking. Scan the area for obstacles that may increase the risk of trips and falls. When getting up in the mornings, sit up at the edge of the bed for a few minutes before  getting out of bed. Consider elevating the bed at the head end to avoid drop of blood pressure when getting up. Walk always in a well-lit room (use night lights in the walls). Avoid area rugs or power cords from appliances in the middle of the walkways. Use a walker or a cane if necessary and consider physical therapy for balance exercise. Get your eyesight checked regularly.  FINANCIAL OVERSIGHT: Supervision, especially oversight when making financial decisions or transactions is also recommended.  HOME SAFETY: Consider the safety of the kitchen when operating appliances like stoves, microwave oven, and blender. Consider having supervision and share cooking responsibilities until no longer able to participate in those. Accidents with firearms and other hazards in the house should be identified and addressed as well.   ABILITY TO BE LEFT ALONE: If patient is unable to contact 911 operator, consider using LifeLine, or when the need is there, arrange for someone to stay with patients. Smoking is a fire hazard, consider supervision or cessation. Risk of wandering should be assessed by caregiver and if detected at any point, supervision and safe proof recommendations should be instituted.  MEDICATION SUPERVISION: Inability to self-administer medication needs to be constantly addressed. Implement a mechanism to ensure safe administration of the medications.      Mediterranean Diet A Mediterranean diet refers to food and lifestyle choices that are based on the traditions of countries located on the [employer]. This way of eating has been shown to help prevent certain conditions and improve outcomes for people who have chronic diseases, like kidney disease and heart disease. What are tips for following this plan? Lifestyle  Cook and eat meals together with your family, when possible. Drink enough fluid to keep your urine clear or pale yellow. Be physically active every day. This  includes: Aerobic exercise like running or swimming. Leisure activities like gardening, walking, or housework. Get 7-8 hours of sleep each night. If recommended by your health care provider, drink red wine in moderation. This means 1 glass a day for nonpregnant women and 2 glasses a day for men. A glass of wine equals 5 oz (150 mL). Reading food labels  Check the serving size of packaged foods. For foods such as rice and pasta, the serving size refers to the amount of cooked product, not dry. Check the total fat in packaged foods. Avoid foods that have saturated fat or trans fats. Check the ingredients list for added sugars, such as corn syrup. Shopping  At the grocery store, buy most of your food from the areas near the walls of the store. This includes: Fresh fruits and vegetables (produce). Grains, beans, nuts, and seeds. Some of these may be available in unpackaged forms or large amounts (in bulk). Fresh seafood. Poultry and eggs. Low-fat dairy products. Buy whole ingredients instead of prepackaged foods. Buy fresh fruits and vegetables in-season from local farmers markets. Buy frozen fruits and vegetables in resealable bags. If you do not have access to quality fresh seafood, buy precooked frozen shrimp or canned fish, such as tuna, salmon, or sardines. Buy small amounts of raw or cooked vegetables, salads, or olives from the deli or salad bar at your store. Stock your pantry so you always have certain foods on  hand, such as olive oil, canned tuna, canned tomatoes, rice, pasta, and beans. Cooking  Cook foods with extra-virgin olive oil instead of using butter or other vegetable oils. Have meat as a side dish, and have vegetables or grains as your main dish. This means having meat in small portions or adding small amounts of meat to foods like pasta or stew. Use beans or vegetables instead of meat in common dishes like chili or lasagna. Experiment with different cooking methods. Try  roasting or broiling vegetables instead of steaming or sauteing them. Add frozen vegetables to soups, stews, pasta, or rice. Add nuts or seeds for added healthy fat at each meal. You can add these to yogurt, salads, or vegetable dishes. Marinate fish or vegetables using olive oil, lemon juice, garlic, and fresh herbs. Meal planning  Plan to eat 1 vegetarian meal one day each week. Try to work up to 2 vegetarian meals, if possible. Eat seafood 2 or more times a week. Have healthy snacks readily available, such as: Vegetable sticks with hummus. Greek yogurt. Fruit and nut trail mix. Eat balanced meals throughout the week. This includes: Fruit: 2-3 servings a day Vegetables: 4-5 servings a day Low-fat dairy: 2 servings a day Fish, poultry, or lean meat: 1 serving a day Beans and legumes: 2 or more servings a week Nuts and seeds: 1-2 servings a day Whole grains: 6-8 servings a day Extra-virgin olive oil: 3-4 servings a day Limit red meat and sweets to only a few servings a month What are my food choices? Mediterranean diet Recommended Grains: Whole-grain pasta. Brown rice. Bulgar wheat. Polenta. Couscous. Whole-wheat bread. [place] Made[place]. Vegetables: Artichokes. Beets. Broccoli. Cabbage. Carrots. Eggplant. Green beans. Chard. Kale. Spinach. Onions. Leeks. Peas. Squash. Tomatoes. Peppers. Radishes. Fruits: Apples. Apricots. Avocado. Berries. Bananas. Cherries. Dates. Figs. Grapes. Lemons. Melon. Oranges. Peaches. Plums. Pomegranate. Meats and other protein foods: Beans. Almonds. Sunflower seeds. Pine nuts. Peanuts. Cod. Salmon. Scallops. Shrimp. Tuna. Tilapia. Clams. Oysters. Eggs. Dairy: Low-fat milk. Cheese. Greek yogurt. Beverages: Water. Red wine. Herbal tea. Fats and oils: Extra virgin olive oil. Avocado oil. Grape seed oil. Sweets and desserts: Greek yogurt with honey. Baked apples. Poached pears. Trail mix. Seasoning and other foods: Basil. Cilantro. Coriander. Cumin. Mint.  Parsley. Sage. Rosemary. Tarragon. Garlic. Oregano. Thyme. Pepper. Balsalmic vinegar. Tahini. Hummus. Tomato sauce. Olives. Mushrooms. Limit these Grains: Prepackaged pasta or rice dishes. Prepackaged cereal with added sugar. Vegetables: Deep fried potatoes (french fries). Fruits: Fruit canned in syrup. Meats and other protein foods: Beef. Pork. Lamb. Poultry with skin. Hot dogs. [place]. Dairy: Ice cream. Sour cream. Whole milk. Beverages: Juice. Sugar-sweetened soft drinks. Beer. Liquor and spirits. Fats and oils: Butter. Canola oil. Vegetable oil. Beef fat (tallow). Lard. Sweets and desserts: Cookies. Cakes. Pies. Candy. Seasoning and other foods: Mayonnaise. Premade sauces and marinades. The items listed may not be a complete list. Talk with your dietitian about what dietary choices are right for you. Summary The Mediterranean diet includes both food and lifestyle choices. Eat a variety of fresh fruits and vegetables, beans, nuts, seeds, and whole grains. Limit the amount of red meat and sweets that you eat. Talk with your health care provider about whether it is safe for you to drink red wine in moderation. This means 1 glass a day for nonpregnant women and 2 glasses a day for men. A glass of wine equals 5 oz (150 mL). This information is not intended to replace advice given to you by your health care provider. Make sure  you discuss any questions you have with your health care provider. Document Released: 09/01/2015 Document Revised: 10/04/2015 Document Reviewed: 09/01/2015 Elsevier Interactive Patient Education  2017 [employer].   [facility] [phone] Labs today [address]

## 2024-02-12 ENCOUNTER — Encounter: Payer: Self-pay | Admitting: Physician Assistant

## 2024-08-04 ENCOUNTER — Ambulatory Visit: Payer: Self-pay | Admitting: Physician Assistant
